# Patient Record
Sex: Female | Born: 1950 | Race: White | Hispanic: No | Marital: Married | Smoking: Never smoker
Health system: Southern US, Community
[De-identification: ages and names within clinical notes are randomized; demographics above are authoritative.]

## PROBLEM LIST (undated history)

## (undated) DIAGNOSIS — J45909 Unspecified asthma, uncomplicated: Secondary | ICD-10-CM

## (undated) DIAGNOSIS — E782 Mixed hyperlipidemia: Secondary | ICD-10-CM

## (undated) DIAGNOSIS — I499 Cardiac arrhythmia, unspecified: Secondary | ICD-10-CM

## (undated) DIAGNOSIS — K219 Gastro-esophageal reflux disease without esophagitis: Secondary | ICD-10-CM

## (undated) DIAGNOSIS — F419 Anxiety disorder, unspecified: Secondary | ICD-10-CM

## (undated) DIAGNOSIS — R55 Syncope and collapse: Secondary | ICD-10-CM

## (undated) DIAGNOSIS — C50919 Malignant neoplasm of unspecified site of unspecified female breast: Secondary | ICD-10-CM

## (undated) DIAGNOSIS — M1990 Unspecified osteoarthritis, unspecified site: Secondary | ICD-10-CM

## (undated) DIAGNOSIS — IMO0002 Reserved for concepts with insufficient information to code with codable children: Secondary | ICD-10-CM

## (undated) DIAGNOSIS — N649 Disorder of breast, unspecified: Secondary | ICD-10-CM

## (undated) DIAGNOSIS — IMO0001 Reserved for inherently not codable concepts without codable children: Secondary | ICD-10-CM

## (undated) HISTORY — DX: Gastro-esophageal reflux disease without esophagitis: K21.9

## (undated) HISTORY — DX: Mixed hyperlipidemia: E78.2

## (undated) HISTORY — DX: Malignant neoplasm of unspecified site of unspecified female breast: C50.919

## (undated) HISTORY — DX: Cardiac arrhythmia, unspecified: I49.9

## (undated) HISTORY — DX: Unspecified asthma, uncomplicated: J45.909

## (undated) HISTORY — DX: Reserved for inherently not codable concepts without codable children: IMO0001

## (undated) HISTORY — DX: Syncope and collapse: R55

## (undated) HISTORY — DX: Anxiety disorder, unspecified: F41.9

## (undated) HISTORY — DX: Reserved for concepts with insufficient information to code with codable children: IMO0002

## (undated) HISTORY — DX: Disorder of breast, unspecified: N64.9

---

## 1993-02-17 HISTORY — PX: OTHER SURGICAL HISTORY: SHX169

## 2001-08-30 ENCOUNTER — Encounter: Payer: Self-pay | Admitting: Obstetrics and Gynecology

## 2001-08-30 ENCOUNTER — Ambulatory Visit (HOSPITAL_COMMUNITY): Admission: RE | Admit: 2001-08-30 | Discharge: 2001-08-30 | Payer: Self-pay | Admitting: Obstetrics and Gynecology

## 2002-10-14 ENCOUNTER — Other Ambulatory Visit: Admission: RE | Admit: 2002-10-14 | Discharge: 2002-10-14 | Payer: Self-pay | Admitting: Obstetrics and Gynecology

## 2003-09-13 ENCOUNTER — Ambulatory Visit (HOSPITAL_COMMUNITY): Admission: RE | Admit: 2003-09-13 | Discharge: 2003-09-13 | Payer: Self-pay | Admitting: Obstetrics and Gynecology

## 2004-02-18 HISTORY — PX: ABDOMINAL HYSTERECTOMY: SHX81

## 2004-08-30 ENCOUNTER — Ambulatory Visit (HOSPITAL_COMMUNITY): Admission: RE | Admit: 2004-08-30 | Discharge: 2004-08-30 | Payer: Self-pay | Admitting: Obstetrics and Gynecology

## 2004-09-04 ENCOUNTER — Ambulatory Visit (HOSPITAL_COMMUNITY): Admission: RE | Admit: 2004-09-04 | Discharge: 2004-09-04 | Payer: Self-pay | Admitting: Obstetrics and Gynecology

## 2004-09-12 ENCOUNTER — Other Ambulatory Visit: Admission: RE | Admit: 2004-09-12 | Discharge: 2004-09-12 | Payer: Self-pay | Admitting: Gynecology

## 2004-09-23 ENCOUNTER — Observation Stay (HOSPITAL_COMMUNITY): Admission: RE | Admit: 2004-09-23 | Discharge: 2004-09-24 | Payer: Self-pay | Admitting: Gynecology

## 2004-09-23 ENCOUNTER — Encounter (INDEPENDENT_AMBULATORY_CARE_PROVIDER_SITE_OTHER): Payer: Self-pay | Admitting: Specialist

## 2005-09-09 ENCOUNTER — Ambulatory Visit (HOSPITAL_COMMUNITY): Admission: RE | Admit: 2005-09-09 | Discharge: 2005-09-09 | Payer: Self-pay | Admitting: Gynecology

## 2005-09-22 ENCOUNTER — Other Ambulatory Visit: Admission: RE | Admit: 2005-09-22 | Discharge: 2005-09-22 | Payer: Self-pay | Admitting: Gynecology

## 2006-04-08 ENCOUNTER — Ambulatory Visit: Payer: Self-pay | Admitting: Cardiovascular Disease

## 2006-04-08 ENCOUNTER — Ambulatory Visit (HOSPITAL_COMMUNITY): Admission: RE | Admit: 2006-04-08 | Discharge: 2006-04-08 | Payer: Self-pay | Admitting: Internal Medicine

## 2006-04-10 ENCOUNTER — Ambulatory Visit: Payer: Self-pay | Admitting: Cardiology

## 2006-10-06 ENCOUNTER — Other Ambulatory Visit: Admission: RE | Admit: 2006-10-06 | Discharge: 2006-10-06 | Payer: Self-pay | Admitting: Gynecology

## 2006-10-13 ENCOUNTER — Ambulatory Visit (HOSPITAL_COMMUNITY): Admission: RE | Admit: 2006-10-13 | Discharge: 2006-10-13 | Payer: Self-pay | Admitting: Gynecology

## 2008-08-23 ENCOUNTER — Ambulatory Visit (HOSPITAL_COMMUNITY): Admission: RE | Admit: 2008-08-23 | Discharge: 2008-08-23 | Payer: Self-pay | Admitting: Gynecology

## 2008-09-25 ENCOUNTER — Ambulatory Visit: Payer: Self-pay | Admitting: Gynecology

## 2008-09-27 ENCOUNTER — Encounter: Payer: Self-pay | Admitting: Gynecology

## 2008-09-27 ENCOUNTER — Other Ambulatory Visit: Admission: RE | Admit: 2008-09-27 | Discharge: 2008-09-27 | Payer: Self-pay | Admitting: Gynecology

## 2008-09-27 ENCOUNTER — Ambulatory Visit: Payer: Self-pay | Admitting: Gynecology

## 2009-04-20 ENCOUNTER — Ambulatory Visit: Payer: Self-pay | Admitting: Gastroenterology

## 2009-04-20 ENCOUNTER — Ambulatory Visit (HOSPITAL_COMMUNITY): Admission: EM | Admit: 2009-04-20 | Discharge: 2009-04-20 | Payer: Self-pay | Admitting: Emergency Medicine

## 2009-04-20 ENCOUNTER — Telehealth: Payer: Self-pay | Admitting: Gastroenterology

## 2009-04-20 ENCOUNTER — Encounter: Payer: Self-pay | Admitting: Gastroenterology

## 2009-04-20 HISTORY — PX: ESOPHAGOGASTRODUODENOSCOPY: SHX1529

## 2009-04-23 ENCOUNTER — Telehealth: Payer: Self-pay | Admitting: Gastroenterology

## 2009-04-25 ENCOUNTER — Encounter: Payer: Self-pay | Admitting: Gastroenterology

## 2009-05-02 ENCOUNTER — Ambulatory Visit: Payer: Self-pay | Admitting: Gastroenterology

## 2009-05-02 HISTORY — PX: OTHER SURGICAL HISTORY: SHX169

## 2009-05-03 ENCOUNTER — Encounter (INDEPENDENT_AMBULATORY_CARE_PROVIDER_SITE_OTHER): Payer: Self-pay

## 2009-05-11 ENCOUNTER — Encounter: Payer: Self-pay | Admitting: Urgent Care

## 2009-05-22 ENCOUNTER — Telehealth (INDEPENDENT_AMBULATORY_CARE_PROVIDER_SITE_OTHER): Payer: Self-pay

## 2009-05-29 ENCOUNTER — Ambulatory Visit: Payer: Self-pay | Admitting: Gastroenterology

## 2009-05-29 DIAGNOSIS — K219 Gastro-esophageal reflux disease without esophagitis: Secondary | ICD-10-CM

## 2009-05-29 DIAGNOSIS — R195 Other fecal abnormalities: Secondary | ICD-10-CM | POA: Insufficient documentation

## 2009-07-18 HISTORY — PX: COLONOSCOPY: SHX174

## 2009-07-27 ENCOUNTER — Ambulatory Visit (HOSPITAL_COMMUNITY): Admission: RE | Admit: 2009-07-27 | Discharge: 2009-07-27 | Payer: Self-pay | Admitting: Gastroenterology

## 2009-07-27 ENCOUNTER — Ambulatory Visit: Payer: Self-pay | Admitting: Gastroenterology

## 2009-07-27 HISTORY — PX: ESOPHAGOGASTRODUODENOSCOPY: SHX1529

## 2010-03-10 ENCOUNTER — Encounter: Payer: Self-pay | Admitting: Obstetrics and Gynecology

## 2010-03-10 ENCOUNTER — Encounter: Payer: Self-pay | Admitting: Emergency Medicine

## 2010-03-19 NOTE — Medication Information (Signed)
Summary: Tax adviser   Imported By: Diana Eves 04/20/2009 13:07:01  _____________________________________________________________________  External Attachment:    Type:   Image     Comment:   External Document  Appended Document: RX Folder Need written version  Appended Document: RX FolderDEXILANT    Prescriptions: DEXILANT 60 MG CPDR (DEXLANSOPRAZOLE) 1 by mouth every AM. Take a dose today.  #30 x 5   Entered and Authorized by:   Joselyn Arrow FNP-BC   Signed by:   Joselyn Arrow FNP-BC on 04/23/2009   Method used:   Electronically to        [facility] (retail)       [address]       [place], [place]  [address]       Ph: [phone]       Fax: [phone]   RxID:   [id number]     Appended Document: RX FolderDexilant  Meds updated w/ pt.  PA done.  Pt failed OTC omeprazole >26month and nexium 40mg  daily >40yrs.

## 2010-03-19 NOTE — Medication Information (Signed)
Summary: PA for dexilant  PA for dexilant   Imported By: Hendricks Limes LPN 45/40/9811 91:47:82  _____________________________________________________________________  External Attachment:    Type:   Image     Comment:   External Document

## 2010-03-19 NOTE — Progress Notes (Signed)
Summary: change to Aciphex  Phone Note Other Incoming   Summary of Call: RMR called- pt is having some diarrhea that they feel is associated with Dexilant. Pt to try Aciphex. #15 samples left at the front desk.  Initial call taken by: Hendricks Limes LPN,  May 22, 2009 9:07 AM     Appended Document: change to Aciphex pts husband, Dr. Emelda Fear related this information to me.  Will get a telephone report in  1 week

## 2010-03-19 NOTE — Progress Notes (Signed)
Summary: Start Baclofen. Dexilant samples  Sx improved. Not using carafate ot Lidocaine. Needs OPV in 2 months, RE: GERD. Also please take Dexilant samples for pt to Dr. Rayna Sexton office, #14. They are for his wife. West Bali MD  April 23, 2009 2:13 PM      New/Updated Medications: BACLOFEN 10 MG TABS (BACLOFEN) 1 by mouth 30 minutes prior to breakfast and lunch. Try first dose at home. Prescriptions: BACLOFEN 10 MG TABS (BACLOFEN) 1 by mouth 30 minutes prior to breakfast and lunch. Try first dose at home.  #60 x 5   Entered and Authorized by:   West Bali MD   Signed by:   West Bali MD on 04/23/2009   Method used:   Electronically to        [facility] (retail)       [address]       [place], [place]  [address]       Ph: [phone]       Fax: [phone]   RxID:   [id number]   Appended Document: Start Baclofen. Dexilant samples Samples left for courrier to take to Dr. Robinette Haines office

## 2010-03-19 NOTE — Assessment & Plan Note (Signed)
Summary: GERD, DIARRHEA   Visit Type:  Initial Consult Primary Care Provider:  Ouida Sills, M.D.  Chief Complaint:  hospital f/U.  History of Present Illness: No soild stool since starting Dexilant. 45-1hr after a meal: liquid/watery, and recognizable food parts. Never switched to Aciphex. Work schedule may be having an impact. When she worlks less stomach better. dexilant helped reflux but still has abd pain. Fels like her breath is hot. Not with Nexium. No sore throat or resp Sx with dexilant. Nexium is the only Rx med for Sx. Thinks Baclofen has helped.   Current Medications (verified): 1)  Dexilant 60 Mg Cpdr (Dexlansoprazole) .[name] Kitchen.. 1 By Mouth Every Am. Take A Dose Today. 2)  Baclofen 10 Mg Tabs (Baclofen) .[name] Kitchen.. 1 By Mouth 30 Minutes Prior To Breakfast and Lunch. Try First Dose At Home. 3)  Estratest 1.25mg  .... One By Mouth Daily 4)  Calcium 1200 .... Take 1 Tablet By Mouth Once A Day 5)  Centrum Silver .... Take 1 Tablet By Mouth Once A Day  Allergies (verified): 1)  ! Biaxin 2)  ! Nsaids 3)  ! Flagyl 4)  ! * Decongestants  Past History:  Past Medical History: GERD **2011 EGD/ gastric BX: No Barrett's, MILD GASTRITIS **2011 BRAVO CAPSULE: Gastroesophageal reflux disease with adequate acid , possible nonacid reflux-DAY 1: 24 episodes of reflux; DAY 2: 1 episode of reflux. The DeMeester score on day 1 was 7.3, DAY 2: 0.3. Her symptom association probability was 98.4% supine and 98.4% regurgitation.  ABD PAIN  Family History: 3 relatives had esophageal CA: 1/3 no EtOH or tobacco, mother had adenoCA. Other 2o athology not knowmn.  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 No ETOH since being on Baclofen.  Review of Systems       MAR 2011: ALT 31   AST 40h    ALK PHOS 108 TBILI 0.8 ALB 3.7 LIP 19 UA: NEG CR 0.73 K 4.0 WBC 11.1 HB 15.7 PLT 231  Vital Signs:  Patient profile:   60 year old female Height:      67.5 inches Weight:      153 pounds BMI:     23.69 Temp:     97.9  degrees F oral Pulse rate:   72 / minute BP sitting:   110 / 70  (left arm) Cuff size:   regular  Vitals Entered By: Cloria Spring LPN (May 29, 2009 4:20 PM)  Physical Exam  General:  Well developed, well nourished, no acute distress. Head:  Normocephalic and atraumatic. Eyes:  PERRLA, no icterus. Lungs:  Clear throughout to auscultation. Heart:  Regular rate and rhythm; no murmurs. Abdomen:  Soft, MILD EPIGASTRIC tenderness and nondistended. Normal bowel sounds. Extremities:  No edema or deformities noted. Neurologic:  Alert and  oriented x4;  grossly normal neurologically.  Impression & Recommendations:  Problem # 1:  GERD (ICD-530.81)  Pt has 3 relatives with esophageal CA. Reflex moderately controlled. Stop Dexilant 2o to side effects: ? diarrhea. Add Aciphex daily. Continue Baclofen. Consider repreat EGD in 5 years. Explained to pt unless she has uncotrolled acid reflux, then fundoplication:endo or lap will likely not benefit her. Pt declined TCA for stress related abd pain. OPV in 4 mos.  Orders: Est. Patient Level V ([address])  Problem # 2:  SCREENING, COLON CANCER (ICD-V76.51)  TCS JUNE 10-SUPREP.  Orders: Est. Patient Level V ([address])  Problem # 3:  DIARRHEA (ICD-787.91) 2o to Dexilant. Pt asked to call me regrading diarrhea in 2 weeks. If Sx  persists, stool studIes.  CC: PCP Prescriptions: ACIPHEX 20 MG TBEC (RABEPRAZOLE SODIUM) 1 by mouth every morning  #30 x 5   Entered and Authorized by:   West Bali MD   Signed by:   West Bali MD on 05/29/2009   Method used:   Electronically to        [facility] (retail)       [address] [place], [place]  [address]       Ph: [phone]       Fax: [phone]   RxID:   [id number]

## 2010-03-19 NOTE — Letter (Signed)
Summary: TCS ORDER  TCS ORDER   Imported By: Ave Filter 05/29/2009 17:00:34  _____________________________________________________________________  External Attachment:    Type:   Image     Comment:   External Document

## 2010-03-19 NOTE — Letter (Signed)
Summary: PATH REPORT  PATH REPORT   Imported By: Diana Eves 05/02/2009 16:33:01  _____________________________________________________________________  External Attachment:    Type:   Image     Comment:   External Document

## 2010-03-19 NOTE — Letter (Signed)
Summary: Plan of Care, Need to Discuss  [facility]  [address]   [place], [place] [address]   Phone: [phone]  Fax: [phone]    May 03, 2009  Angie Jennings [address] [place], [place]  [address] [birth year]   Dear [name],   We are writing this letter to inform you of treatment plans and/or discuss your plan of care.  We have tried several times to contact you; however, we have yet to reach you.  We ask that you please contact our office for follow-up on your gastrointestinal issues.  We can  be reached at [phone] to schedule an appointment, or to speak with someone regarding your health care needs.  Please do not neglect your health.   Sincerely,    Cloria Spring LPN  [facility] Ph: [phone]    Fax: [phone]   Appended Document: Plan of Care, Need to Discuss Letter returned. Spoke with pt at work, informed her of the plan, she has follow-up appt in  may to see Dr. Darrick Penna. York Spaniel her address is correct, not sure shy letter returned)

## 2010-03-19 NOTE — Progress Notes (Signed)
Summary: chest pain, BRAVO STUDY       New/Updated Medications: DEXILANT 60 MG CPDR (DEXLANSOPRAZOLE) 1 by mouth every AM. Take a dose today. CARAFATE 1 GM/10ML SUSP (SUCRALFATE) 1 gm by mouth q6h as needed chest pain LIDOCAINE VISCOUS 2 % SOLN (LIDOCAINE HCL) 10 ml q4-6h as needed chest pain Prescriptions: LIDOCAINE VISCOUS 2 % SOLN (LIDOCAINE HCL) 10 ml q4-6h as needed chest pain  #200 ml x 5   Entered and Authorized by:   West Bali MD   Signed by:   West Bali MD on 04/20/2009   Method used:   Electronically to        [facility] (retail)       [address] [place], [place]  [address]       Ph: [phone]       Fax: [phone]   RxID:   [id number] CARAFATE 1 GM/10ML SUSP (SUCRALFATE) 1 gm by mouth q6h as needed chest pain  #90 grams x 5   Entered and Authorized by:   West Bali MD   Signed by:   West Bali MD on 04/20/2009   Method used:   Electronically to        [facility] (retail)       [address]       [place], [place]  [address]       Ph: [phone]       Fax: [phone]   RxID:   [id number] DEXILANT 60 MG CPDR (DEXLANSOPRAZOLE) 1 by mouth every AM. Take a dose today.  #30 x 5   Entered and Authorized by:   West Bali MD   Signed by:   West Bali MD on 04/20/2009   Method used:   Electronically to        [facility] (retail)       [address] [place], [place]  [address]       Ph: [phone]       Fax: [phone]   RxID:   [id number]  Pt seen in ED. EGD: mild gastritis. Needs rx for carafate, viscous lidocaine, and Kapidex. West Bali MD  April 20, 2009 11:43 AM

## 2010-03-22 NOTE — Consult Note (Signed)
Summary: Consultation Report  Consultation Report   Imported By: Diana Eves 04/25/2009 13:39:46  _____________________________________________________________________  External Attachment:    Type:   Image     Comment:   External Document

## 2010-05-06 ENCOUNTER — Encounter: Payer: Self-pay | Admitting: Gastroenterology

## 2010-05-13 LAB — HEPATIC FUNCTION PANEL
ALT: 31 U/L (ref 0–35)
AST: 40 U/L — ABNORMAL HIGH (ref 0–37)
Bilirubin, Direct: 0.1 mg/dL (ref 0.0–0.3)
Total Bilirubin: 0.8 mg/dL (ref 0.3–1.2)

## 2010-05-13 LAB — CBC
HCT: 47.1 % — ABNORMAL HIGH (ref 36.0–46.0)
MCHC: 33.4 g/dL (ref 30.0–36.0)
Platelets: 231 10*3/uL (ref 150–400)
RBC: 5.12 MIL/uL — ABNORMAL HIGH (ref 3.87–5.11)

## 2010-05-13 LAB — URINALYSIS, ROUTINE W REFLEX MICROSCOPIC
Glucose, UA: NEGATIVE mg/dL
Ketones, ur: NEGATIVE mg/dL
Protein, ur: NEGATIVE mg/dL
Specific Gravity, Urine: 1.005 (ref 1.005–1.030)
pH: 5.5 (ref 5.0–8.0)

## 2010-05-13 LAB — BASIC METABOLIC PANEL
BUN: 13 mg/dL (ref 6–23)
Calcium: 9.1 mg/dL (ref 8.4–10.5)
Creatinine, Ser: 0.73 mg/dL (ref 0.4–1.2)
GFR calc Af Amer: >60 mL/min (ref >60)
GFR calc non Af Amer: >60 mL/min (ref >60)
Potassium: 4 mEq/L (ref 3.5–5.1)
Sodium: 141 mEq/L (ref 135–145)

## 2010-05-13 LAB — DIFFERENTIAL
Basophils Absolute: 0 10*3/uL (ref 0.0–0.1)
Eosinophils Relative: 3 % (ref 0–5)
Monocytes Absolute: 0.7 10*3/uL (ref 0.1–1.0)
Neutro Abs: 8.3 10*3/uL — ABNORMAL HIGH (ref 1.7–7.7)
Neutrophils Relative %: 75 % (ref 43–77)

## 2010-05-13 LAB — POCT CARDIAC MARKERS: Myoglobin, poc: 44.1 ng/mL (ref 12–200)

## 2010-05-13 LAB — AMYLASE: Amylase: 35 U/L (ref 0–105)

## 2010-05-16 NOTE — Medication Information (Signed)
Summary: PA for Aciphex  PA for Aciphex   Imported By: Hendricks Limes LPN 16/11/9602 54:09:81  _____________________________________________________________________  External Attachment:    Type:   Image     Comment:   External Document

## 2010-07-05 NOTE — H&P (Signed)
NAME:  Angie Jennings, Angie Jennings NO.:  [id number]   MEDICAL RECORD NO.:  [id number]          PATIENT TYPE:  OBV   LOCATION:                                FACILITY:  [facility]   PHYSICIAN:  Juan H. Lily Peer, M.D.DATE OF BIRTH:  1950/06/27   DATE OF ADMISSION:  09/23/2004  DATE OF DISCHARGE:                                HISTORY & PHYSICAL   CHIEF COMPLAINT:  Chronic right lower quadrant pain.   HISTORY:  The patient is a 60 year old, gravida 5, para 3, AB 2, who was  referred to our practice on September 13, 2004 as a courtesy of Dr. Christin Bach, OB/GYN, in [place], [place].  The patient has chronic  lower quadrant pain.  As part of her history, she has stated that her  periods have been regular every 30-35 days up until January 2006 with no  unusual pattern as to mild pelvic heaviness, a day or so prior to the onset  of her periods.  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  Her problems started in March whereby she began experiencing  on day #6 some spotting and pelvic pain and developed a low grade fever  peaking at 101 and subsequently had a urinalysis, urine culture and  sensitivity, and GC and Chlamydia culture.  All were normal with the  exception of slightly elevated white blood count. While waiting for the  results of the culture, she initially had been placed on erythromycin for  three days and then switched to Levaquin for 10 days.  The fever  defervesced.  She still continued to fill somewhat the discomfort in the  lower abdomen.  She skipped her period in April but still continued to feel  bloated.  On May 12 and 13, she continued to have pressure and pain right  before her menses as well as during her periods along with the bloating and  tenderness.  On July 8, she begin to have right lower quadrant discomfort of  the abdomen and states that when she  straightens her torso, that she feels  some tightening or pulling in the right lower abdominal area.  There was no  urinary or GI problems reported.  No nausea or vomiting.  Appetite has been  the same.  She subsequently had an ultrasound July 14 which had demonstrated  a small left ovarian cyst measuring 1.6 cm and a hypoechoic mass between the  right ovary and the uterus.  It appeared solid.  In an effort to delineate  the solid mass to see if it was coming off the right ovary or hemorrhage  cyst, or a pedunculated fibroid, an MRI was done with contrast on July 19,  and the findings were as follows:   She had a 2.7 x 2.3 x 2 cm exophytic mass arising from the anterior aspect  of the uterus on the right and this corresponded with the location and the  findings on the recent ultrasound.  The right ovary  was able to be  delineated separately and posterior to the right ovary.  A tangle of veins  were demonstrated that were described as draining into the right ovarian  vein.  A small 8 mm left ovarian cyst was noted.  Endometrium had a normal  appearance.  No enlarged lymph nodes were seen or any free fluid in the  peritoneal cavity was noted.   PAST MEDICAL HISTORY:  She has had five pregnancies, three children  delivered vaginally and two miscarriages.   FAMILY HISTORY:  Father was hypertension, also lung cancer.  Mother with  history of esophageal cancer and brother with prostate cancer.  Her husband  has had a vasectomy in the past.   ALLERGIES:  BIAXIN, METRONIDAZOLE, NONSTEROIDALS.   PAST SURGICAL HISTORY:  In 1998, she had an L4-5 fusion.   CURRENT MEDICATIONS:  Darvocet p.r.n. for back pain and Sudafed for  congestion p.r.n.   PHYSICAL EXAMINATION:  VITAL SIGNS:  The patient is 5 feet 7 inches tall.  She weighs 172 pounds.  Blood pressure was 136/80.  HEENT: Unremarkable.  NECK:  Supple.  Trachea midline.  No carotid bruits.  No thyromegaly.  LUNGS:  Clear to auscultation.   No rhonchus or wheezes.  HEART:  Regular rate and rhythm.  No murmurs or gallops.  BREASTS:  Both breasts were symmetrical in appearance.  No skin excoriation  or nipple inversion.  No palpable masses or tenderness.  No supraclavicular  or axillary lymphadenopathy.  ABDOMEN:  Soft, tenderness in the right lower quadrant but no rebound or  guarding.  Positive bowel sounds.  PELVIC:  Bartholin's, urethral and Skein's glands within normal limits.  Vagina is small.  First degree rectocele.  No cystocele per se.  No uterine  descensus even when she was examined in the erect position.  Bimanual exam  showed anteverted uterus, irregularity on the right uterine sidewall.  Tenderness to palpation with cervical motion tenderness.  RECTAL:  Hemoccult negative.  Mild tenderness elicited in the rectal exam.  The patient has stated that she had had a colonoscopy in 1990 as a result of  occult blood and she was scheduled for one next year.  Negative colon cancer  history in her family.  The patient is also due for a mammogram and one was  done last year.   LABORATORY DATA:  Besides the ultrasound and CT scan which was reported  above, she stated that back in March 2006 her CBC had just only had a  slightly elevated white blood count with 10.8.  Her urinalysis had been  negative along with GC and Chlamydia cultures.   ASSESSMENT:  A 60 year old, gravida 5, para 3, AB 2, with chronic right  lower quadrant pain. 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  The patient is perimenopausal.  She stated that she started to  have slight advance of oligomenorrhea.   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  I explained to Mrs. Maske that the recommendation  would be to proceed with an exploration and since she is very active and  cannot afford any down time, that perhaps we may offer her a laparoscopic  approach such as the laparoscopic-assisted  vaginal hysterectomy and  bilateral salpingo-oophorectomy.  In the event of any technical difficulty  or suspicious lesions that would not be able to be obtained  laparoscopically, then open abdominal approach would need to be utilized.  We had a detailed discussion of the [facility] trial and we  would also recommend placement on a low dose estrogen post surgery for a few  years and eventually taper off.  These risks and benefits and pros and cons  of hysterectomy, potential complications, were discussed.  Literature  information was also provided on hysterectomy.  We discussed potential  complications to include trauma to internal organs requiring reparative  surgery such as injury to the bladder, intestines, blood vessels.  Also in  the event of uncontrollable hemorrhage and she would need a blood  transfusion, potential risks of anaphylactic reaction and hepatitis and  these were all discussed.  The patient will also have PSA stockings to  prevent DVT and subsequent pulmonary embolism.  She should also have  intravenous antibiotics for prophylaxis.  We have recommended also bowel  prep prior to surgery.   PLAN:  The patient is scheduled for laparoscopic-assisted vaginal  hysteroscopy with bilateral salpingo-oophorectomy, possible laparotomy  Monday, August 7 at 7:30 a.m. at [facility].      Arlington H. Lily Peer, M.D.  Electronically Signed     JHF/MEDQ  D:  09/20/2004  T:  09/20/2004  Job:  [id number]

## 2010-07-05 NOTE — Procedures (Signed)
NAMESIMRA, FIEBIG NO.:  [id number]   MEDICAL RECORD NO.:  [id number]          PATIENT TYPE:  OUT   LOCATION:  RAD                           FACILITY:  [facility]   PHYSICIAN:  Noralyn Pick. Eden Emms, MD, FACCDATE OF BIRTH:  09-19-1950   DATE OF PROCEDURE:  04/08/2006  DATE OF DISCHARGE:                                ECHOCARDIOGRAM   PROCEDURE:  Echocardiogram.   INDICATIONS:  Palpitations.   The left ventricular  cavity size was normal.  EF was 60%.  There were  no wall motion abnormalities and no LVH.  The mitral valve was mildly  thickened with trivial MR.  The left atrium and right sided cardiac  chambers were normal.  There was no evidence of pulmonary hypertension,  only mild PR.  The aortic valve was trileaflet and sclerotic.  There was  no evidence of stenosis.  The aortic root was normal.  Subcostal imaging  revealed no atrial septal defect, no source of embolus, no effusion.   M-mode measurements included an aortic diameter of 26 mm, left atrial  diameter 35 mm, septal thickness of 10 mm, LV diastolic dimension 45 mm,  and LV systolic dimension 31 mm.   IMPRESSION:  1. Normal left ventricular function, ejection fraction 60%.  2. Trivial mitral regurgitation without prolapse.  3. Normal left atrium and right sided cardiac chambers.  4. No pericardial effusion.  5. Aortic valve sclerosis.      Noralyn Pick. Eden Emms, MD, [facility]  Electronically Signed     PCN/MEDQ  D:  04/08/2006  T:  04/08/2006  Job:  [id number]

## 2010-07-05 NOTE — Procedures (Signed)
NAMEJASIMINE, SIMMS NO.:  [id number]   MEDICAL RECORD NO.:  [id number]          PATIENT TYPE:  OUT   LOCATION:  RAD                           FACILITY:  [facility]   PHYSICIAN:  Noralyn Pick. Eden Emms, MD, FACCDATE OF BIRTH:  January 26, 1951   DATE OF PROCEDURE:  DATE OF DISCHARGE:                                ECHOCARDIOGRAM   Audio too short to transcribe (less than 5 seconds)      Peter C. Eden Emms, MD, [facility]     PCN/MEDQ  D:  04/08/2006  T:  04/08/2006  Job:  [id number]

## 2010-07-05 NOTE — Discharge Summary (Signed)
NAMEALEXAS, Angie Jennings NO.:  [id number]   MEDICAL RECORD NO.:  [id number]          PATIENT TYPE:  OBV   LOCATION:  9308                          [facility]ILITY:  [facility]   PHYSICIAN:  Juan H. Lily Peer, M.D.DATE OF BIRTH:  02/01/1951   DATE OF ADMISSION:  09/23/2004  DATE OF DISCHARGE:                                 DISCHARGE SUMMARY   Total days hospitalized:  1.   HISTORY:  The patient is a 60 year old gravida 5 para 3 AB 2 with chronic  right lower quadrant pain and suspected leiomyoma contributing to chronic  pain, possibly underlying endometriosis or pelvic adhesions. The patient  underwent a diagnostic laparoscopy with laparoscopic-assisted vaginal  hysterectomy, bilateral salpingo-oophorectomy, and lysis of pelvic  adhesions. The patient's blood loss from the procedure was less than 100 mL  and she received 2000 mL of lactated Ringer's. Urine output had been 500 mL.  The patient had received prophylaxis antibiotic 2 g of Cefotan prior to her  surgery and had PSA stockings for DVT prophylaxis. The patient  postoperatively did well. In the evening of her surgery she was tolerating  clear liquids and the following morning her Foley catheter had been  discontinued at 6 a.m. and she had tolerated a full regular diet and had  been up and ambulating, anxious to go home. Her vital signs were as follows:  Her temperature was 98.3. She had a hemoglobin of 11.9; hematocrit of 35.1;  platelet count 226,000. Her lungs were clear to auscultation. Heart regular  rate and rhythm, no murmurs or gallops. The abdomen was soft, positive bowel  sounds. Slight small ecchymoses were noted at the three port incision sites  but no evidence of erythema. The patient reported no vaginal bleeding.  Extremities without any edema or any tenderness or cords. The patient was  ready to be discharged home.   DIAGNOSES:  1.  Chronic right lower quadrant pain.  2.  Leiomyomatous uteri.  3.  Pelvic  adhesions.   PROCEDURE PERFORMED:  1.  Diagnostic laparoscopy.  2.  Lysis of pelvic adhesions.  3.  Laparoscopic-assisted vaginal hysterectomy with bilateral salpingo-      oophorectomy.   FINAL DISPOSITION AND FOLLOW-UP:  The patient was discharged home within 24  hours from her surgery. She had been up and ambulating, tolerating full  liquid and regular diet, had passed gas, was afebrile. Pathology report  pending at time of this dictation. The patient was given a prescription of  Lortab to take one tablet q.4-6h. p.r.n. (7.5/500). Also she was given a  prescription of Reglan 10 mg to take one p.o. q.3-4h. p.r.n. For hormone  replacement therapy, she was placed on Estratest 0.625 mg p.o. daily and she  was scheduled to return back to the office in 2 weeks for her postoperative  visit.     JHF/MEDQ  D:  09/24/2004  T:  09/24/2004  Job:  [id number]

## 2010-07-05 NOTE — Op Note (Signed)
NAMEJAVANNA, [name] NO.:  [id number]   MEDICAL RECORD NO.:  [id number]          PATIENT TYPE:  OBV   LOCATION:  9308                          FACILITY:  [facility]   PHYSICIAN:  Juan H. Lily Peer, M.D.DATE OF BIRTH:  [birth year]   DATE OF PROCEDURE:  09/23/2004  DATE OF DISCHARGE:                                 OPERATIVE REPORT   SURGEON:  Juan H. Lily Peer, M.D.   FIRST ASSISTANT:  Daniel L. Eda Paschal, M.D.   INDICATIONS FOR PROCEDURE:  A 60 year old gravida 5, para 3, AB 2, with  chronic right lower quadrant pain.  Ultrasound and CT scan demonstrating a  subserosal leiomyoma.  Working diagnoses besides the subserosal leiomyoma  are pelvic adhesions and/or endometriosis contributing to her chronic pelvic  pain.   PREOPERATIVE DIAGNOSES:  1.  Chronic pelvic pain.  2.  Leiomyomata uteri.  3.  Rule out endometriosis.  4.  Rule out adhesions.   POSTOPERATIVE DIAGNOSES:  1.  Subserosal leiomyoma.  2.  Pelvic adhesions in the cul-de-sac.  3.  Appendices epiploica.   ANESTHESIA:  General endotracheal anesthesia.   OPERATION/PROCEDURE:  1.  Diagnostic laparoscopy.  2.  Lysis of pelvic adhesions.  3.  Removal of right infarcted appendices epiploica.  4.  Laparoscopic-assisted vaginal hysterectomy with bilateral salpingo-      oophorectomy.   FINDINGS:  1.  The patient had a 2 x 3 cm subserosal leiomyoma coming off the right      uterine fundal region.  2.  Pelvic adhesions in the region of the cul-de-sac.  3.  Adherence of an appendices epiploica to the right pelvic sidewall in the      cul-de-sac.  4.  Normal-appearing tubes and ovaries.   DESCRIPTION OF PROCEDURE:  After the patient was adequately counseled, she  was taken to the operating room where she underwent a successful general  endotracheal anesthesia.  The patient had been on a bowel prep the day  before and the day of her surgery received 2 g of Cefotan for prophylaxis  and had PSA stockings  for DVT prophylaxis.  The patient was placed in the  lower lithotomy position after the abdomen was prepped.  The abdomen, vagina  and perineum were prepped and draped in the usual fashion.  A small stab  incision was made in the subumbilical region and a Veress needle was  introduced into the peritoneal cavity.  Opening intra-abdominal pressure was  approximately 4 mmHg.  Approximately 3 L of carbon dioxide were insufflated  into the peritoneal cavity.  The Veress needle was removed.  The 10 mm  trocar was inserted and the diagnostic laparoscope was inserted.  Two  additional incisions were made to allow 5 mm trocars to be inserted under  laparoscopic guidance approximately five fingerbreadths from midline on the  patient's lower abdomen bilaterally.  In a systematic fashion, the entire  pelvic cavity was inspected with the above-mentioned findings.  The appendix  appeared retrocecal and appeared to be intact.  Smooth liver surface and  smooth appearance of the gallbladder.   Attention was then placed first to the  cul-de-sac region where the adhesions  were noted.  First, near the right uterosacral ligament, an adherence of  what appeared to be an infarcted appendices epiploica was excised, removed  and  passed off the operative field for histologic evaluation after the  adhesions were freed.  A left para-ovarian adhesion to the cul-de-sac was  freed as well with the use of the tripolar unit.  Once the adhesions were  freed, the hysterectomy was started in the following fashion.   The right ureter was identified.  There infundibulopelvic ligament was  clamped, cauterized and transected all the way to include the round ligament  which was clamped, cauterized and transected and the broad ligament and  cardinal ligament down to the level of the uterine artery and the bladder  flap had been established with the laparoscopic scissors.  Similar procedure  was carried out on the contralateral  side and the vaginal portion of the  operation was then undertaken in the following fashion.  The patient's legs  were placed in the high lithotomy position and Lahey thyroid clamps were  placed on the anterior and posterior cervical lip.  2% lidocaine with  1:100,000 epinephrine was infiltrated to the cervicovaginal fold in  circumferential fashion followed by circumferential incision on a similar  region.  A posterior colpotomy was established.  The long weighted billed  speculum was inserted to the cul-de-sac.  Each uterosacral ligament was  clamped, cut and suture ligated and tagged with 0 Vicryl suture.  The  remaining broad and cardinal ligaments were clamped, cut and suture ligated  with 0 Vicryl suture.  The anterior cul-de-sac was entered meticulously  after freeing the bladder away from the lower uterine segment.  The  remaining pedicles were clamped, cut and suture ligated with 0 Vicryl  suture.  The uterus and cervix and tubes and ovaries were passed off the  operative field.  The remaining pedicles were secured with 0 Vicryl suture  in a transfixation manner followed by a pursestring suture of 0 Vicryl  suture as well.  The vagina was copiously irrigated with normal saline  solution.  The posterior vaginal cuff was closed with a running stitch of 0  Vicryl suture incorporating the peritoneum and the entire vaginal cuff was  then closed with interrupted sutures of 0 Vicryl suture.  Since the patient  had a somewhat narrow vagina, there was no need for a culdoplasty or any  additional vaginal work.   The vagina was irrigated with normal saline solution once again and the  final portion of the procedure entailed by looking laparoscopically into the  peritoneal cavity to ascertain adequate hemostasis.  In a systematic  fashion, both infundibulopelvic ligament pedicles were inspected and appeared to be dry as was the vaginal cuff.  The peritoneal cavity was  copiously irrigated  with normal saline and upon completion, the carbon  dioxide was removed from the peritoneal cavity and the instruments were  removed.  The subumbilical 10 mm trocar site, the fascia was closed in a  pursestring with 0 Vicryl suture and all three port sites were closed with  Dermabond glue for the skin closure.  For postoperative analgesia, 0.25%  Marcaine was infiltrated in all three incision sites for a total of 10 mL.  The patient tolerated the procedure well.  He had a urine output of 500 mL,  received 2000 mL of lactated Ringer's and estimated blood loss was less than  100 mL.  She was extubated and transferred  to the recovery room with stable  vital signs.       JHF/MEDQ  D:  09/23/2004  T:  09/24/2004  Job:  [id number]

## 2011-01-08 ENCOUNTER — Other Ambulatory Visit: Payer: Self-pay | Admitting: Gastroenterology

## 2011-01-08 NOTE — Telephone Encounter (Signed)
Letter mailed to pt to call and schedule appt.  

## 2011-01-08 NOTE — Telephone Encounter (Signed)
Recommend OV in next 1-2 months prior to further refills.

## 2011-04-07 ENCOUNTER — Other Ambulatory Visit: Payer: Self-pay | Admitting: Gastroenterology

## 2011-09-24 ENCOUNTER — Ambulatory Visit (HOSPITAL_COMMUNITY)
Admission: RE | Admit: 2011-09-24 | Discharge: 2011-09-24 | Disposition: A | Discharge status: Home / Self Care | Payer: BC Managed Care – PPO | Source: Ambulatory Visit | Attending: Internal Medicine | Admitting: Internal Medicine

## 2011-09-24 ENCOUNTER — Other Ambulatory Visit (HOSPITAL_COMMUNITY): Payer: Self-pay | Admitting: Internal Medicine

## 2011-09-24 DIAGNOSIS — R05 Cough: Secondary | ICD-10-CM

## 2011-09-24 DIAGNOSIS — R059 Cough, unspecified: Secondary | ICD-10-CM | POA: Insufficient documentation

## 2011-10-09 ENCOUNTER — Other Ambulatory Visit (HOSPITAL_COMMUNITY): Payer: Self-pay | Admitting: Internal Medicine

## 2011-10-09 DIAGNOSIS — Z139 Encounter for screening, unspecified: Secondary | ICD-10-CM

## 2011-10-14 ENCOUNTER — Ambulatory Visit (HOSPITAL_COMMUNITY)
Admission: RE | Admit: 2011-10-14 | Discharge: 2011-10-14 | Disposition: A | Discharge status: Home / Self Care | Payer: BC Managed Care – PPO | Source: Ambulatory Visit | Attending: Internal Medicine | Admitting: Internal Medicine

## 2011-10-14 DIAGNOSIS — Z1231 Encounter for screening mammogram for malignant neoplasm of breast: Secondary | ICD-10-CM | POA: Insufficient documentation

## 2011-10-14 DIAGNOSIS — Z139 Encounter for screening, unspecified: Secondary | ICD-10-CM

## 2011-10-17 ENCOUNTER — Other Ambulatory Visit: Payer: Self-pay | Admitting: Gastroenterology

## 2011-10-17 NOTE — Telephone Encounter (Signed)
Pt due for OV FU prior to refills

## 2011-10-29 ENCOUNTER — Ambulatory Visit: Payer: BC Managed Care – PPO | Admitting: Gastroenterology

## 2011-11-11 ENCOUNTER — Encounter: Payer: Self-pay | Admitting: Gastroenterology

## 2011-11-12 ENCOUNTER — Ambulatory Visit (INDEPENDENT_AMBULATORY_CARE_PROVIDER_SITE_OTHER): Payer: BC Managed Care – PPO | Admitting: Gastroenterology

## 2011-11-12 ENCOUNTER — Encounter: Payer: BC Managed Care – PPO | Admitting: Gastroenterology

## 2011-11-12 ENCOUNTER — Encounter: Payer: Self-pay | Admitting: Gastroenterology

## 2011-11-12 VITALS — BP 128/78 | HR 73 | Temp 98.1°F | Ht 67.0 in | Wt 157.2 lb

## 2011-11-12 DIAGNOSIS — K219 Gastro-esophageal reflux disease without esophagitis: Secondary | ICD-10-CM

## 2011-11-12 NOTE — Patient Instructions (Addendum)
START ACIPHEX TWICE DAILY.  FOLLOW A LOW FAT DIET. SEE INFO BELOW.  AVOID ITEMS THAT TRIGGER REFLUX.  TRY TO AVOID EVEN DECAF COFFEE AND ALCOHOL. SEE INFO BELOW.   UPPER ENDOSCOPY WITH BRAVO OCT 4.  FOLLOW UP IN DEC 2013.   Lifestyle and home remedies FOR REFLUX  You may eliminate or reduce the frequency of heartburn by making the following lifestyle changes:    Control your weight. Being overweight is a major risk factor for heartburn and GERD. Excess pounds put pressure on your abdomen, pushing up your stomach and causing acid to back up into your esophagus.     Eat smaller meals. 4 TO 6 MEALS A DAY. This reduces pressure on the lower esophageal sphincter, helping to prevent the valve from opening and acid from washing back into your esophagus.     Loosen your belt. Clothes that fit tightly around your waist put pressure on your abdomen and the lower esophageal sphincter.    Eliminate heartburn triggers. Everyone has specific triggers.     Common triggers such as fatty or fried foods, spicy food, tomato sauce, carbonated beverages, alcohol, chocolate, mint, garlic, onion, caffeine and nicotine may make heartburn worse.     Avoid stooping or bending. Tying your shoes is OK. Bending over for longer periods to weed your garden isn't, especially soon after eating.     Don't lie down after a meal. Wait at least three to four hours after eating before going to bed, and don't lie down right after eating.   Alternative medicine   Several home remedies exist for treating GERD, but they provide only temporary relief. They include drinking baking soda (sodium bicarbonate) added to water or drinking other fluids such as baking soda mixed with cream of tartar and water.   Although these liquids create temporary relief by neutralizing, washing away or buffering acids, eventually they aggravate the situation by adding gas and fluid to your stomach, increasing pressure and causing more acid reflux.  Further, adding more sodium to your diet may increase your blood pressure and add stress to your heart, and excessive bicarbonate ingestion can alter the acid-base balance in your body.

## 2011-11-12 NOTE — Progress Notes (Signed)
Reminder in epic to follow up with SF in E30 in 3 months °

## 2011-11-12 NOTE — Progress Notes (Signed)
Faxed to PCP

## 2011-11-12 NOTE — Progress Notes (Signed)
  Subjective:    Patient ID: Angie Jennings, female    DOB: 11/01/1950, 61 y.o.   MRN: [id number]  PCP: [facility]  HPI No problems swallowing. Having trouble with pain in primarily in right ear.3-4 times a week. Coughing and wheezing improved after steroids/zpak aug 2013. CXR: NAIAP, FEELS LIKE ACIPHEX NOT WORKING AND REFLUX WORSE SINCE SHE LOST WEIGHT. WEIGHT 153 LBS IN 2011. ADMITS TO ETOH USE AND DECAF COFFEE. LAST EGD 2011: GASTRITIS, FGP. BRAVO SHOWED NON-ACID REFLUX. WAS ON KAPIDEX AT THE TIME AND STATE SIT CAUSED DIARRHEA. HAS FAILED NEXUM IN THE PAST.  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  Past Medical History  Diagnosis Date  . GERD (gastroesophageal reflux disease)   . Anxiety      Past Surgical History  Procedure Date  . Esophagogastroduodenoscopy 04/20/09    mild gastritis  . Esophagogastroduodenoscopy 07/27/09    small internal hemorrhoids/rare sigmoid colon diverticula other wise no polyps  . Bravo capsule endoscopy 05/02/09    GERD with adequate acid suppression  . Abdominal hysterectomy 2006   Allergies  Allergen Reactions  . Clarithromycin   . Kapidex (Dexlansoprazole) Diarrhea  . Metronidazole   . Nsaids      Current Outpatient Prescriptions  Medication Sig Dispense Refill  . ACIPHEX 20 MG tablet TAKE (1) TABLET BY MOUTH ONCE A DAY IN THE MORNING FOR ACID REFLUX.    . calcium citrate (CALCITRATE - DOSED IN MG ELEMENTAL CALCIUM) 950 MG tablet Take 1 tablet by mouth daily.      . EST ESTROGENS-METHYLTEST HS 0.625-1.25 MG per tablet Take 1 tablet by mouth daily.           Review of Systems     Objective:   Physical Exam  Constitutional: She is oriented to person, place, and time. She appears well-nourished. No distress.  HENT:  Head: Normocephalic and atraumatic.  Mouth/Throat: Oropharynx is clear and moist. No oropharyngeal exudate.  Eyes: Pupils are equal, round, and reactive to light. No scleral icterus.  Neck: Normal range of  motion. Neck supple.  Cardiovascular: Normal rate, regular rhythm and normal heart sounds.   Pulmonary/Chest: Effort normal and breath sounds normal. No respiratory distress.  Abdominal: Soft. Bowel sounds are normal. She exhibits no distension.  Musculoskeletal: Normal range of motion. She exhibits no edema.  Lymphadenopathy:    She has no cervical adenopathy.  Neurological: She is alert and oriented to person, place, and time.       NO FOCAL DEFICITS   Psychiatric:       SLIGHTLY ANXIOUS MOOD, NL AFFECT          Assessment & Plan:   

## 2011-11-12 NOTE — Assessment & Plan Note (Addendum)
SX NOT IDEALLY CONTROLLED ON ACIPHEX QD.  START ACIPHEX TWICE DAILY. NO SAMPLES AVAILABLE. PT DECLINES A NEW RX AT THIS TIME.  FOLLOW A LOW FAT DIET. SEE INFO BELOW.  AVOID ITEMS THAT TRIGGER REFLUX.  TRY TO AVOID EVEN DECAF COFFEE AND ALCOHOL. HO GIVEN.  UPPER ENDOSCOPY WITH BRAVO OCT 4 ON MEDS.  FOLLOW UP IN DEC 2013.

## 2011-11-18 ENCOUNTER — Encounter (HOSPITAL_COMMUNITY): Payer: Self-pay | Admitting: Pharmacy Technician

## 2011-11-19 ENCOUNTER — Ambulatory Visit: Payer: BC Managed Care – PPO | Admitting: Gastroenterology

## 2011-11-19 NOTE — Progress Notes (Signed)
error 

## 2011-11-21 ENCOUNTER — Encounter (HOSPITAL_COMMUNITY)
Admission: RE | Disposition: A | Discharge status: Home / Self Care | Payer: Self-pay | Source: Ambulatory Visit | Attending: Gastroenterology

## 2011-11-21 ENCOUNTER — Ambulatory Visit (HOSPITAL_COMMUNITY)
Admission: RE | Admit: 2011-11-21 | Discharge: 2011-11-21 | Disposition: A | Discharge status: Home / Self Care | Payer: BC Managed Care – PPO | Source: Ambulatory Visit | Attending: Gastroenterology | Admitting: Gastroenterology

## 2011-11-21 ENCOUNTER — Encounter (HOSPITAL_COMMUNITY): Payer: Self-pay | Admitting: *Deleted

## 2011-11-21 DIAGNOSIS — K219 Gastro-esophageal reflux disease without esophagitis: Secondary | ICD-10-CM

## 2011-11-21 DIAGNOSIS — R1013 Epigastric pain: Secondary | ICD-10-CM

## 2011-11-21 DIAGNOSIS — K2990 Gastroduodenitis, unspecified, without bleeding: Secondary | ICD-10-CM

## 2011-11-21 DIAGNOSIS — K2970 Gastritis, unspecified, without bleeding: Secondary | ICD-10-CM

## 2011-11-21 DIAGNOSIS — K3189 Other diseases of stomach and duodenum: Secondary | ICD-10-CM | POA: Insufficient documentation

## 2011-11-21 HISTORY — PX: ESOPHAGOGASTRODUODENOSCOPY: SHX5428

## 2011-11-21 HISTORY — PX: BRAVO PH STUDY: SHX5421

## 2011-11-21 HISTORY — PX: COLONOSCOPY: SHX174

## 2011-11-21 SURGERY — EGD (ESOPHAGOGASTRODUODENOSCOPY)
Anesthesia: Moderate Sedation

## 2011-11-21 MED ORDER — SODIUM CHLORIDE 0.45 % IV SOLN
INTRAVENOUS | Status: DC
  Administered 2011-11-21: 1000 mL via INTRAVENOUS

## 2011-11-21 MED ORDER — MEPERIDINE HCL 100 MG/ML IJ SOLN
INTRAMUSCULAR | Status: AC
  Filled 2011-11-21: qty 2

## 2011-11-21 MED ORDER — MIDAZOLAM HCL 5 MG/5ML IJ SOLN
INTRAMUSCULAR | Status: AC
  Filled 2011-11-21: qty 10

## 2011-11-21 MED ORDER — BUTAMBEN-TETRACAINE-BENZOCAINE 2-2-14 % EX AERO
INHALATION_SPRAY | CUTANEOUS | Status: DC | PRN
  Administered 2011-11-21: 2 via TOPICAL

## 2011-11-21 MED ORDER — STERILE WATER FOR IRRIGATION IR SOLN
Status: DC | PRN
  Administered 2011-11-21: 14:00:00

## 2011-11-21 MED ORDER — MIDAZOLAM HCL 5 MG/5ML IJ SOLN
INTRAMUSCULAR | Status: DC | PRN
  Administered 2011-11-21 (×2): 2 mg via INTRAVENOUS

## 2011-11-21 MED ORDER — MEPERIDINE HCL 100 MG/ML IJ SOLN
INTRAMUSCULAR | Status: DC | PRN
  Administered 2011-11-21 (×2): 25 mg via INTRAVENOUS

## 2011-11-21 NOTE — H&P (View-Only) (Signed)
  Subjective:    Patient ID: Angie Jennings, female    DOB: Mar 16, 1950, 61 y.o.   MRN: [id number]  PCP: Ouida Sills  HPI No problems swallowing. Having trouble with pain in primarily in right ear.3-4 times a week. Coughing and wheezing improved after steroids/zpak aug 2013. CXR: NAIAP, FEELS LIKE ACIPHEX NOT WORKING AND REFLUX WORSE SINCE SHE LOST WEIGHT. WEIGHT 153 LBS IN 2011. ADMITS TO ETOH USE AND DECAF COFFEE. LAST EGD 2011: GASTRITIS, FGP. BRAVO SHOWED NON-ACID REFLUX. WAS ON KAPIDEX AT THE TIME AND STATE SIT CAUSED DIARRHEA. HAS FAILED NEXUM IN THE PAST.  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  Past Medical History  Diagnosis Date  . GERD (gastroesophageal reflux disease)   . Anxiety      Past Surgical History  Procedure Date  . Esophagogastroduodenoscopy 04/20/09    mild gastritis  . Esophagogastroduodenoscopy 07/27/09    small internal hemorrhoids/rare sigmoid colon diverticula other wise no polyps  . Bravo capsule endoscopy 05/02/09    GERD with adequate acid suppression  . Abdominal hysterectomy 2006   Allergies  Allergen Reactions  . Clarithromycin   . Kapidex (Dexlansoprazole) Diarrhea  . Metronidazole   . Nsaids      Current Outpatient Prescriptions  Medication Sig Dispense Refill  . ACIPHEX 20 MG tablet TAKE (1) TABLET BY MOUTH ONCE A DAY IN THE MORNING FOR ACID REFLUX.    . calcium citrate (CALCITRATE - DOSED IN MG ELEMENTAL CALCIUM) 950 MG tablet Take 1 tablet by mouth daily.      [name] Kitchen EST ESTROGENS-METHYLTEST HS 0.625-1.25 MG per tablet Take 1 tablet by mouth daily.           Review of Systems     Objective:   Physical Exam  Constitutional: She is oriented to person, place, and time. She appears well-nourished. No distress.  HENT:  Head: Normocephalic and atraumatic.  Mouth/Throat: Oropharynx is clear and moist. No oropharyngeal exudate.  Eyes: Pupils are equal, round, and reactive to light. No scleral icterus.  Neck: Normal range of  motion. Neck supple.  Cardiovascular: Normal rate, regular rhythm and normal heart sounds.   Pulmonary/Chest: Effort normal and breath sounds normal. No respiratory distress.  Abdominal: Soft. Bowel sounds are normal. She exhibits no distension.  Musculoskeletal: Normal range of motion. She exhibits no edema.  Lymphadenopathy:    She has no cervical adenopathy.  Neurological: She is alert and oriented to person, place, and time.       NO FOCAL DEFICITS   Psychiatric:       SLIGHTLY ANXIOUS MOOD, NL AFFECT          Assessment & Plan:

## 2011-11-21 NOTE — Interval H&P Note (Signed)
History and Physical Interval Note:  11/21/2011 1:45 PM  Angie Jennings  has presented today for surgery, with the diagnosis of Dyspepsia and uncontrolled GERD  The various methods of treatment have been discussed with the patient and family. After consideration of risks, benefits and other options for treatment, the patient has consented to  Procedure(s) (LRB) with comments: ESOPHAGOGASTRODUODENOSCOPY (EGD) (N/A) - 2:00 BRAVO PH STUDY (N/A) as a surgical intervention .  The patient's history has been reviewed, patient examined, no change in status, stable for surgery.  I have reviewed the patient's chart and labs.  Questions were answered to the patient's satisfaction.     [employer]

## 2011-11-21 NOTE — Op Note (Addendum)
[facility] [address] [place], [address]   ENDOSCOPY PROCEDURE REPORT  PATIENT: Angie Jennings, Angie Jennings  MR#: #[id number] BIRTHDATE: [birth year] , 61  yrs. old GENDER: Female  ENDOSCOPIST: Jonette Eva, MD REFERRED Raphael Gibney, M.D.  PROCEDURE DATE: 11/21/2011 PROCEDURE:   EGD w/ Bravo capsule placement and EGD w/ biopsy  INDICATIONS:dyspepsia.   UNCONTROLLED REFLUX. MEDICATIONS: Demerol 50 mg IV and Versed 4 mg IV TOPICAL ANESTHETIC:   Cetacaine Spray  DESCRIPTION OF PROCEDURE:     Physical exam was performed.  Informed consent was obtained from the patient after explaining the benefits, risks, and alternatives to the procedure.  The patient was connected to the monitor and placed in the left lateral position.  Continuous oxygen was provided by nasal cannula and IV medicine administered through an indwelling cannula.  After administration of sedation, the patients esophagus was intubated and the Pentax Gastroscope [id number]  endoscope was advanced under direct visualization to the second portion of the duodenum.  The scope was removed slowly by carefully examining the color, texture, anatomy, and integrity of the mucosa on the way out.  The patient was recovered in endoscopy and discharged home in satisfactory condition.      ESOPHAGUS: The mucosa of the esophagus appeared normal.   GE JUNCTION 43 CM FROM THE TEETH.  CAPSULE PLACED 37 CM FROM THE TEETH.  STOMACH: Non-erosive gastritis (inflammation) was found.  Multiple biopsies were performed.  DUODENUM: The duodenal mucosa showed no abnormalities.   COMPLICATIONS:   None  ENDOSCOPIC IMPRESSION: 1.   The mucosa of the esophagus appeared normal 2.   Non-erosive gastritis (inflammation) was found; multiple biopsies 3.   The duodenal mucosa showed no abnormalities  RECOMMENDATIONS: CONTINUE ACIPHEX BID.  RETURN THE RECORDER ON MONDAY.  FOLLOW A LOW FAT DIET.  BIOPSY & BRAVO RESULTS WITHIN 7  DAYS.  FOLLOW UP IN DEC 2013.   REPEAT EXAM:   _______________________________ Rosalie DoctorJonette Eva, MD 11/21/2011 3:01 PM Revised: 11/21/2011 3:01 PM      PATIENT NAME:  Angie Jennings, Angie Jennings MR#: #[id number]

## 2011-11-24 ENCOUNTER — Telehealth: Payer: Self-pay | Admitting: Gastroenterology

## 2011-11-24 DIAGNOSIS — K219 Gastro-esophageal reflux disease without esophagitis: Secondary | ICD-10-CM

## 2011-11-24 NOTE — Telephone Encounter (Signed)
Called patient TO DISCUSS RESULTS. LVM-CALL [id number] TO DISCUSS.  BRAVO SHOWS NERD V. NUD. RECOMMEND BACLOFEN BID. IF FAILS CONSIDER TCA OR SSRI.

## 2011-11-24 NOTE — Brief Op Note (Signed)
11/21/2011  2:27 PM  PATIENT:  Angie Jennings  61 y.o. female  PRE-OPERATIVE DIAGNOSIS:  Dyspepsia and uncontrolled GERD-LAST BRAVO 2011 : Gastroesophageal reflux disease with adequate acid , possible nonacid reflux-DAY 1: 24 episodes of reflux; DAY 2: 1 episode of reflux. The DeMeester score on day 1 was 7.3, DAY 2: 0.3. Her symptom association probability was 98.4% supine and 98.4% regurgitation. WEIGHT 153 LBS IN 2011 AND NOW 157 LBS.  POST-OPERATIVE DIAGNOSIS:  GE junction 43, Gastric Reflux, Gastritis  PROCEDURE:  Procedure(s) (LRB) with comments: ESOPHAGOGASTRODUODENOSCOPY (EGD) (N/A) - 2:00 BRAVO PH STUDY (N/A) ON ACIPHEX BID. OFF BACLOFEN.  SURGEON:  Surgeon(s) and Role:    * West Bali, MD - Primary   PROCEDURE:  Procedure(s): BRAVO PH STUDY ON ACIPHEX BID  SURGEON:  Surgeon(s): Arlyce Harman, MD  FINDINGS:  PT HAD RECORDER FOR 48 HOURS. STUDY DURATION: 1d 16.52.   FRACTION Ph TOTAL: 1.6 supine 3.6 upright   # OF REFLUXES: 6   LONG REFLUX > 5: 1   LONGEST REFLUX: 18 MINS   DEMEESTER SCORE DAY 1: 6.6 (NL < 14.72) Day 2: 0.5 TOTAL: 4.7    SAP TABLE: 100% SUPINE DIAGNOSIS: NON-ACID REFLUX V. NON-ULCER DYSPEPSIA  PLAN: 1. BACLOFEN QAC BID 2. CONTINUE ACIPHEX. 3. OPV IN DEC 2013-Consider TCA/SSRI IF SX NOT IMPROVED.

## 2011-11-26 ENCOUNTER — Encounter (HOSPITAL_COMMUNITY): Payer: Self-pay | Admitting: Gastroenterology

## 2011-11-27 MED ORDER — BACLOFEN 10 MG PO TABS: ORAL_TABLET | ORAL | Status: DC

## 2011-11-27 NOTE — Telephone Encounter (Signed)
Results faxed to PCP, recall made  

## 2011-11-27 NOTE — Addendum Note (Signed)
Addended by: West Bali on: 11/27/2011 09:20 AM   Modules accepted: Orders

## 2011-11-27 NOTE — Telephone Encounter (Addendum)
Called patient TO DISCUSS RESULTS. DISCUSSED BRAVO AND BX FINDINGS. PT DOESN'T REMEMBER TAKING BACLOFEN IN 2011. NO KNOWN SIDE EFFECTS TO THE DRUG. CONTINUE ACIPHEX BID. BACLOFEN BID. MED WARNINGS GIVEN(DIZZINESS/SEDATION). PT INSTRUCTED TO TAKE FIRST DOSE AND WAIT 2-3 HOURS TO SEE IF SHE WILL HAVE AN ADVERSE REACTION.  OPV IN DEC 2013.

## 2012-01-13 ENCOUNTER — Encounter: Payer: Self-pay | Admitting: *Deleted

## 2012-01-26 ENCOUNTER — Other Ambulatory Visit: Payer: Self-pay

## 2012-01-26 MED ORDER — RABEPRAZOLE SODIUM 20 MG PO TBEC: 20.0000 mg | DELAYED_RELEASE_TABLET | Freq: Two times a day (BID) | ORAL | Status: DC

## 2012-12-20 ENCOUNTER — Other Ambulatory Visit: Payer: Self-pay | Admitting: Obstetrics and Gynecology

## 2012-12-21 NOTE — Telephone Encounter (Signed)
Patient has appt pending at [facility].

## 2013-04-13 ENCOUNTER — Other Ambulatory Visit: Payer: Self-pay | Admitting: Urgent Care

## 2013-05-02 ENCOUNTER — Other Ambulatory Visit: Payer: Self-pay | Admitting: Obstetrics and Gynecology

## 2013-05-02 ENCOUNTER — Telehealth: Payer: Self-pay | Admitting: Obstetrics and Gynecology

## 2013-05-02 MED ORDER — HYDROCODONE-HOMATROPINE 5-1.5 MG/5ML PO SYRP: 5.0000 mL | ORAL_SOLUTION | Freq: Four times a day (QID) | ORAL | Status: DC | PRN

## 2013-05-02 NOTE — Telephone Encounter (Signed)
Patient has had 3 days of progressive sore throat, with purulent tonsils, now responding to PCN . Pt has had severe coughing x 12 hours, due to throat discomfort. Chest clear.  A Pharyngeal irritation P Hydromet  Rx to [facility].

## 2013-05-25 ENCOUNTER — Other Ambulatory Visit: Payer: Self-pay | Admitting: Obstetrics and Gynecology

## 2013-05-27 NOTE — Telephone Encounter (Signed)
Continued use of E/T planned, discussed with patient. Dr Toney Rakes aware.

## 2013-08-10 ENCOUNTER — Other Ambulatory Visit: Payer: Self-pay | Admitting: Obstetrics and Gynecology

## 2013-09-07 ENCOUNTER — Other Ambulatory Visit: Payer: Self-pay | Admitting: Obstetrics and Gynecology

## 2013-09-07 DIAGNOSIS — Z1231 Encounter for screening mammogram for malignant neoplasm of breast: Secondary | ICD-10-CM

## 2013-09-08 ENCOUNTER — Ambulatory Visit (HOSPITAL_COMMUNITY)
Admission: RE | Admit: 2013-09-08 | Discharge: 2013-09-08 | Disposition: A | Discharge status: Home / Self Care | Payer: BC Managed Care – PPO | Source: Ambulatory Visit | Attending: Obstetrics and Gynecology | Admitting: Obstetrics and Gynecology

## 2013-09-08 DIAGNOSIS — Z1231 Encounter for screening mammogram for malignant neoplasm of breast: Secondary | ICD-10-CM | POA: Insufficient documentation

## 2013-09-14 ENCOUNTER — Other Ambulatory Visit: Payer: Self-pay | Admitting: Obstetrics and Gynecology

## 2013-09-14 DIAGNOSIS — R928 Other abnormal and inconclusive findings on diagnostic imaging of breast: Secondary | ICD-10-CM

## 2013-09-17 HISTORY — PX: BREAST SURGERY: SHX581

## 2013-09-20 ENCOUNTER — Other Ambulatory Visit: Payer: Self-pay | Admitting: Obstetrics and Gynecology

## 2013-09-20 ENCOUNTER — Ambulatory Visit (HOSPITAL_COMMUNITY)
Admission: RE | Admit: 2013-09-20 | Discharge: 2013-09-20 | Disposition: A | Discharge status: Home / Self Care | Payer: BC Managed Care – PPO | Source: Ambulatory Visit | Attending: Obstetrics and Gynecology | Admitting: Obstetrics and Gynecology

## 2013-09-20 ENCOUNTER — Other Ambulatory Visit: Payer: Self-pay | Admitting: Certified Registered Nurse Anesthetist

## 2013-09-20 DIAGNOSIS — R921 Mammographic calcification found on diagnostic imaging of breast: Secondary | ICD-10-CM

## 2013-09-20 DIAGNOSIS — R928 Other abnormal and inconclusive findings on diagnostic imaging of breast: Secondary | ICD-10-CM | POA: Insufficient documentation

## 2013-09-23 ENCOUNTER — Ambulatory Visit
Admission: RE | Admit: 2013-09-23 | Discharge: 2013-09-23 | Disposition: A | Discharge status: Home / Self Care | Payer: BC Managed Care – PPO | Source: Ambulatory Visit | Attending: Obstetrics and Gynecology | Admitting: Obstetrics and Gynecology

## 2013-09-23 DIAGNOSIS — R921 Mammographic calcification found on diagnostic imaging of breast: Secondary | ICD-10-CM

## 2013-11-30 ENCOUNTER — Other Ambulatory Visit: Payer: Self-pay | Admitting: Gastroenterology

## 2013-12-07 ENCOUNTER — Ambulatory Visit (INDEPENDENT_AMBULATORY_CARE_PROVIDER_SITE_OTHER): Payer: BC Managed Care – PPO | Admitting: Gynecology

## 2013-12-07 ENCOUNTER — Other Ambulatory Visit (HOSPITAL_COMMUNITY)
Admission: RE | Admit: 2013-12-07 | Discharge: 2013-12-07 | Disposition: A | Discharge status: Home / Self Care | Payer: BC Managed Care – PPO | Source: Ambulatory Visit | Attending: Gynecology | Admitting: Gynecology

## 2013-12-07 ENCOUNTER — Encounter: Payer: Self-pay | Admitting: Gynecology

## 2013-12-07 VITALS — BP 162/94 | Ht 67.5 in | Wt 163.0 lb

## 2013-12-07 DIAGNOSIS — Z01419 Encounter for gynecological examination (general) (routine) without abnormal findings: Secondary | ICD-10-CM

## 2013-12-07 DIAGNOSIS — Z1151 Encounter for screening for human papillomavirus (HPV): Secondary | ICD-10-CM | POA: Diagnosis present

## 2013-12-07 DIAGNOSIS — Z79890 Hormone replacement therapy (postmenopausal): Secondary | ICD-10-CM

## 2013-12-07 DIAGNOSIS — N951 Menopausal and female climacteric states: Secondary | ICD-10-CM

## 2013-12-07 LAB — CBC WITH DIFFERENTIAL/PLATELET
BASOS ABS: 0 10*3/uL (ref 0.0–0.1)
Basophils Relative: 0 % (ref 0–1)
EOS PCT: 4 % (ref 0–5)
Eosinophils Absolute: 0.4 10*3/uL (ref 0.0–0.7)
HCT: 43.7 % (ref 36.0–46.0)
Hemoglobin: 14.8 g/dL (ref 12.0–15.0)
LYMPHS PCT: 30 % (ref 12–46)
Lymphs Abs: 2.7 10*3/uL (ref 0.7–4.0)
MCH: 29.9 pg (ref 26.0–34.0)
MCHC: 33.9 g/dL (ref 30.0–36.0)
MCV: 88.3 fL (ref 78.0–100.0)
Monocytes Absolute: 0.5 10*3/uL (ref 0.1–1.0)
Monocytes Relative: 6 % (ref 3–12)
NEUTROS ABS: 5.3 10*3/uL (ref 1.7–7.7)
NEUTROS PCT: 60 % (ref 43–77)
PLATELETS: 262 10*3/uL (ref 150–400)
RBC: 4.95 MIL/uL (ref 3.87–5.11)
RDW: 14 % (ref 11.5–15.5)
WBC: 8.9 10*3/uL (ref 4.0–10.5)

## 2013-12-07 LAB — HEMOGLOBIN A1C
HEMOGLOBIN A1C: 5.8 % — AB (ref <5.7)
Mean Plasma Glucose: 120 mg/dL — ABNORMAL HIGH (ref <117)

## 2013-12-07 LAB — TSH: TSH: 2.105 u[IU]/mL (ref 0.350–4.500)

## 2013-12-07 MED ORDER — NONFORMULARY OR COMPOUNDED ITEM: Status: DC

## 2013-12-07 MED ORDER — VENLAFAXINE HCL 37.5 MG PO TABS: ORAL_TABLET | ORAL | Status: DC

## 2013-12-07 NOTE — Patient Instructions (Signed)
Influenza Virus Vaccine injection (Fluarix) What is this medicine? INFLUENZA VIRUS VACCINE (in floo EN zuh VAHY ruhs vak SEEN) helps to reduce the risk of getting influenza also known as the flu. This medicine may be used for other purposes; ask your health care provider or pharmacist if you have questions. COMMON BRAND NAME(S): Fluarix, Fluzone What should I tell my health care provider before I take this medicine? They need to know if you have any of these conditions: -bleeding disorder like hemophilia -fever or infection -Guillain-Barre syndrome or other neurological problems -immune system problems -infection with the human immunodeficiency virus (HIV) or AIDS -low blood platelet counts -multiple sclerosis -an unusual or allergic reaction to influenza virus vaccine, eggs, chicken proteins, latex, gentamicin, other medicines, foods, dyes or preservatives -pregnant or trying to get pregnant -breast-feeding How should I use this medicine? This vaccine is for injection into a muscle. It is given by a health care professional. A copy of Vaccine Information Statements will be given before each vaccination. Read this sheet carefully each time. The sheet may change frequently. Talk to your pediatrician regarding the use of this medicine in children. Special care may be needed. Overdosage: If you think you have taken too much of this medicine contact a poison control center or emergency room at once. NOTE: This medicine is only for you. Do not share this medicine with others. What if I miss a dose? This does not apply. What may interact with this medicine? -chemotherapy or radiation therapy -medicines that lower your immune system like etanercept, anakinra, infliximab, and adalimumab -medicines that treat or prevent blood clots like warfarin -phenytoin -steroid medicines like prednisone or cortisone -theophylline -vaccines This list may not describe all possible interactions. Give your  health care provider a list of all the medicines, herbs, non-prescription drugs, or dietary supplements you use. Also tell them if you smoke, drink alcohol, or use illegal drugs. Some items may interact with your medicine. What should I watch for while using this medicine? Report any side effects that do not go away within 3 days to your doctor or health care professional. Call your health care provider if any unusual symptoms occur within 6 weeks of receiving this vaccine. You may still catch the flu, but the illness is not usually as bad. You cannot get the flu from the vaccine. The vaccine will not protect against colds or other illnesses that may cause fever. The vaccine is needed every year. What side effects may I notice from receiving this medicine? Side effects that you should report to your doctor or health care professional as soon as possible: -allergic reactions like skin rash, itching or hives, swelling of the face, lips, or tongue Side effects that usually do not require medical attention (report to your doctor or health care professional if they continue or are bothersome): -fever -headache -muscle aches and pains -pain, tenderness, redness, or swelling at site where injected -weak or tired This list may not describe all possible side effects. Call your doctor for medical advice about side effects. You may report side effects to FDA at 1-800-FDA-1088. Where should I keep my medicine? This vaccine is only given in a clinic, pharmacy, doctor's office, or other health care setting and will not be stored at home. NOTE: This sheet is a summary. It may not cover all possible information. If you have questions about this medicine, talk to your doctor, pharmacist, or health care provider.  2015, Elsevier/Gold Standard. (2007-09-01 09:30:40) Tdap Vaccine (Tetanus, Diphtheria, Pertussis):  What You Need to Know 1. Why get vaccinated? Tetanus, diphtheria and pertussis can be very serious  diseases, even for adolescents and adults. Tdap vaccine can protect [place] from these diseases. TETANUS (Lockjaw) causes painful muscle tightening and stiffness, usually all over the body.  It can lead to tightening of muscles in the head and neck so you can't open your mouth, swallow, or sometimes even breathe. Tetanus kills about 1 out of 5 people who are infected. DIPHTHERIA can cause a thick coating to form in the back of the throat.  It can lead to breathing problems, paralysis, heart failure, and death. PERTUSSIS (Whooping Cough) causes severe coughing spells, which can cause difficulty breathing, vomiting and disturbed sleep.  It can also lead to weight loss, incontinence, and rib fractures. Up to 2 in 100 adolescents and 5 in 100 adults with pertussis are hospitalized or have complications, which could include pneumonia or death. These diseases are caused by bacteria. Diphtheria and pertussis are spread from person to person through coughing or sneezing. Tetanus enters the body through cuts, scratches, or wounds. Before vaccines, the [place] saw as many as 200,000 cases a year of diphtheria and pertussis, and hundreds of cases of tetanus. Since vaccination began, tetanus and diphtheria have dropped by about 99% and pertussis by about 80%. 2. Tdap vaccine Tdap vaccine can protect adolescents and adults from tetanus, diphtheria, and pertussis. One dose of Tdap is routinely given at age 31 or 51. People who did not get Tdap at that age should get it as soon as possible. Tdap is especially important for health care professionals and anyone having close contact with a baby younger than 12 months. Pregnant women should get a dose of Tdap during every pregnancy, to protect the newborn from pertussis. Infants are most at risk for severe, life-threatening complications from pertussis. A similar vaccine, called Td, protects from tetanus and diphtheria, but not pertussis. A Td booster should be given  every 10 years. Tdap may be given as one of these boosters if you have not already gotten a dose. Tdap may also be given after a severe cut or burn to prevent tetanus infection. Your doctor can give you more information. Tdap may safely be given at the same time as other vaccines. 3. Some people should not get this vaccine  If you ever had a life-threatening allergic reaction after a dose of any tetanus, diphtheria, or pertussis containing vaccine, OR if you have a severe allergy to any part of this vaccine, you should not get Tdap. Tell your doctor if you have any severe allergies.  If you had a coma, or long or multiple seizures within 7 days after a childhood dose of DTP or DTaP, you should not get Tdap, unless a cause other than the vaccine was found. You can still get Td.  Talk to your doctor if you:  have epilepsy or another nervous system problem,  had severe pain or swelling after any vaccine containing diphtheria, tetanus or pertussis,  ever had Guillain-Barr Syndrome (GBS),  aren't feeling well on the day the shot is scheduled. 4. Risks of a vaccine reaction With any medicine, including vaccines, there is a chance of side effects. These are usually mild and go away on their own, but serious reactions are also possible. Brief fainting spells can follow a vaccination, leading to injuries from falling. Sitting or lying down for about 15 minutes can help prevent these. Tell your doctor if you feel dizzy or light-headed, or  have vision changes or ringing in the ears. Mild problems following Tdap (Did not interfere with activities)  Pain where the shot was given (about 3 in 4 adolescents or 2 in 3 adults)  Redness or swelling where the shot was given (about 1 person in 5)  Mild fever of at least 100.26F (up to about 1 in 25 adolescents or 1 in 100 adults)  Headache (about 3 or 4 people in 10)  Tiredness (about 1 person in 3 or 4)  Nausea, vomiting, diarrhea, stomach ache (up to  1 in 4 adolescents or 1 in 10 adults)  Chills, body aches, sore joints, rash, swollen glands (uncommon) Moderate problems following Tdap (Interfered with activities, but did not require medical attention)  Pain where the shot was given (about 1 in 5 adolescents or 1 in 100 adults)  Redness or swelling where the shot was given (up to about 1 in 16 adolescents or 1 in 25 adults)  Fever over 102F (about 1 in 100 adolescents or 1 in 250 adults)  Headache (about 3 in 20 adolescents or 1 in 10 adults)  Nausea, vomiting, diarrhea, stomach ache (up to 1 or 3 people in 100)  Swelling of the entire arm where the shot was given (up to about 3 in 100). Severe problems following Tdap (Unable to perform usual activities; required medical attention)  Swelling, severe pain, bleeding and redness in the arm where the shot was given (rare). A severe allergic reaction could occur after any vaccine (estimated less than 1 in a million doses). 5. What if there is a serious reaction? What should I look for?  Look for anything that concerns you, such as signs of a severe allergic reaction, very high fever, or behavior changes. Signs of a severe allergic reaction can include hives, swelling of the face and throat, difficulty breathing, a fast heartbeat, dizziness, and weakness. These would start a few minutes to a few hours after the vaccination. What should I do?  If you think it is a severe allergic reaction or other emergency that can't wait, call 9-1-1 or get the person to the nearest hospital. Otherwise, call your doctor.  Afterward, the reaction should be reported to the "Vaccine Adverse Event Reporting System" (VAERS). Your doctor might file this report, or you can do it yourself through the VAERS web site at www.vaers.SamedayNews.es, or by calling (854)244-9874. VAERS is only for reporting reactions. They do not give medical advice.  6. The National Vaccine Injury Compensation Program The [employer] Vaccine  Injury Compensation Program (VICP) is a federal program that was created to compensate people who may have been injured by certain vaccines. Persons who believe they may have been injured by a vaccine can learn about the program and about filing a claim by calling 479 625 8608 or visiting the [place] website at GoldCloset.com.ee. 7. How can I learn more?  Ask your doctor.  Call your local or state health department.  Contact the Centers for Disease Control and Prevention (CDC):  Call 260-624-1076 or visit CDC's website at http://hunter.com/. CDC Tdap Vaccine VIS (06/26/11) Document Released: 08/05/2011 Document Revised: 06/20/2013 Document Reviewed: 05/18/2013 ExitCare Patient Information 2015 [place], [place]. This information is not intended to replace advice given to you by your health care provider. Make sure you discuss any questions you have with your health care provider.  Shingles Vaccine What You Need to Know WHAT IS SHINGLES?  Shingles is a painful skin rash, often with blisters. It is also called Herpes Zoster or just  Zoster.  A shingles rash usually appears on one side of the face or body and lasts from 2 to 4 weeks. Its main symptom is pain, which can be quite severe. Other symptoms of shingles can include fever, headache, chills, and upset stomach. Very rarely, a shingles infection can lead to pneumonia, hearing problems, blindness, brain inflammation (encephalitis), or death.  For about 1 person in 5, severe pain can continue even after the rash clears up. This is called post-herpetic neuralgia.  Shingles is caused by the Varicella Zoster virus. This is the same virus that causes chickenpox. Only someone who has had a case of chickenpox or rarely, has gotten chickenpox vaccine, can get shingles. The virus stays in your body. It can reappear many years later to cause a case of shingles.  You cannot catch shingles from another person with shingles. However, a  person who has never had chickenpox (or chickenpox vaccine) could get chickenpox from someone with shingles. This is not very common.  Shingles is far more common in people 70 and older than in younger people. It is also more common in people whose immune systems are weakened because of a disease such as cancer or drugs such as steroids or chemotherapy.  At least 1 million people get shingles per year in the [place]. SHINGLES VACCINE  A vaccine for shingles was licensed in 8099. In clinical trials, the vaccine reduced the risk of shingles by 50%. It can also reduce the pain in people who still get shingles after being vaccinated.  A single dose of shingles vaccine is recommended for adults 6 years of age and older. SOME PEOPLE SHOULD NOT GET SHINGLES VACCINE OR SHOULD WAIT A person should not get shingles vaccine if he or she:  Has ever had a life-threatening allergic reaction to gelatin, the antibiotic neomycin, or any other component of shingles vaccine. Tell your caregiver if you have any severe allergies.  Has a weakened immune system because of current:  AIDS or another disease that affects the immune system.  Treatment with drugs that affect the immune system, such as prolonged use of high-dose steroids.  Cancer treatment, such as radiation or chemotherapy.  Cancer affecting the bone marrow or lymphatic system, such as leukemia or lymphoma.  Is pregnant, or might be pregnant. Women should not become pregnant until at least 4 weeks after getting shingles vaccine. Someone with a minor illness, such as a cold, may be vaccinated. Anyone with a moderate or severe acute illness should usually wait until he or she recovers before getting the vaccine. This includes anyone with a temperature of 101.3 F (38 C) or higher. WHAT ARE THE RISKS FROM SHINGLES VACCINE?  A vaccine, like any medicine, could possibly cause serious problems, such as severe allergic reactions. However, the risk  of a vaccine causing serious harm, or death, is extremely small.  No serious problems have been identified with shingles vaccine. Mild Problems  Redness, soreness, swelling, or itching at the site of the injection (about 1 person in 3).  Headache (about 1 person in 10). Like all vaccines, shingles vaccine is being closely monitored for unusual or severe problems. WHAT IF THERE IS A MODERATE OR SEVERE REACTION? What should I look for? Any unusual condition, such as a severe allergic reaction or a high fever. If a severe allergic reaction occurred, it would be within a few minutes to an hour after the shot. Signs of a serious allergic reaction can include difficulty breathing, weakness, hoarseness or  wheezing, a fast heartbeat, hives, dizziness, paleness, or swelling of the throat. What should I do?  Call your caregiver, or get the person to a caregiver right away.  Tell the caregiver what happened, the date and time it happened, and when the vaccination was given.  Ask the caregiver to report the reaction by filing a Vaccine Adverse Event Reporting System (VAERS) form. Or, you can file this report through the VAERS web site at www.vaers.SamedayNews.es or by calling [phone]. VAERS does not provide medical advice. HOW CAN I LEARN MORE?  Ask your caregiver. He or she can give you the vaccine package insert or suggest other sources of information.  Contact the Centers for Disease Control and Prevention (CDC):  Call 220-555-2577 (1-800-CDC-INFO).  Visit the CDC website at http://hunter.com/ CDC Shingles Vaccine VIS (11/23/07) Document Released: 12/01/2005 Document Revised: 04/28/2011 Document Reviewed: 05/26/2012 [facility] Patient Information 2015 [place]. This information is not intended to replace advice given to you by your health care provider. Make sure you discuss any questions you have with your health care provider.

## 2013-12-07 NOTE — Progress Notes (Signed)
Angie Jennings October 14, 1950 [id number]   History:    63 y.o.  For annual exam who has not been seen the office for over 5 years. Patient had a total abdominal hysterectomy back in 2006. She has been on Estratest for almost 10 years for vasomotor symptoms. She has started to taper off and has been now off for several months and is complaining of hot flashes mood swings and her ability. Her blood pressure when she came in was 162/94 on repeat 150/88. She has not seen her PCP in a year. Patient has not had a bone density study in many years. Patient with no past history of abnormal Pap smears. Patient's colonoscopy is up-to-date. Last colonoscopy 2011 no polyps detected.  Patient had left breast stereotactic biopsy this year with the following result: Breast, left, needle core biopsy, LOQ - BENIGN BREAST TISSUE, SEE COMMENT. - NEGATIVE FOR ATYPIA OR MALIGNANCY. - CALCIFICATIONS IDENTIFIED.   Past medical history,surgical history, family history and social history were all reviewed and documented in the EPIC chart.  Gynecologic History No LMP recorded. Patient has had a hysterectomy. Contraception: status post hysterectomy Last Pap: Over 5 years ago. Results were: normal Last mammogram: 2015. Results were: See above  Obstetric History OB History  Gravida Para Term Preterm AB SAB TAB Ectopic Multiple Living  5 3   2 2    3     # Outcome Date GA Lbr Len/2nd Weight Sex Delivery Anes PTL Lv  5 SAB           4 SAB           3 PAR           2 PAR           1 PAR                ROS: A ROS was performed and pertinent positives and negatives are included in the history.  GENERAL: No fevers or chills. HEENT: No change in vision, no earache, sore throat or sinus congestion. NECK: No pain or stiffness. CARDIOVASCULAR: No chest pain or pressure. No palpitations. PULMONARY: No shortness of breath, cough or wheeze. GASTROINTESTINAL: No abdominal pain, nausea, vomiting or diarrhea, melena or  bright red blood per rectum. GENITOURINARY: No urinary frequency, urgency, hesitancy or dysuria. MUSCULOSKELETAL: No joint or muscle pain, no back pain, no recent trauma. DERMATOLOGIC: No rash, no itching, no lesions. ENDOCRINE: No polyuria, polydipsia, no heat or cold intolerance. No recent change in weight. HEMATOLOGICAL: No anemia or easy bruising or bleeding. NEUROLOGIC: No headache, seizures, numbness, tingling or weakness. PSYCHIATRIC: No depression, no loss of interest in normal activity or change in sleep pattern.     Exam: chaperone present  BP 162/94  Ht 5' 7.5" (1.715 m)  Wt 163 lb (73.936 kg)  BMI 25.14 kg/m2  Body mass index is 25.14 kg/(m^2).  General appearance : Well developed well nourished female. No acute distress HEENT: Neck supple, trachea midline, no carotid bruits, no thyroidmegaly Lungs: Clear to auscultation, no rhonchi or wheezes, or rib retractions  Heart: Regular rate and rhythm, no murmurs or gallops Breast:Examined in sitting and supine position were symmetrical in appearance, no palpable masses or tenderness,  no skin retraction, no nipple inversion, no nipple discharge, no skin discoloration, no axillary or supraclavicular lymphadenopathy Abdomen: no palpable masses or tenderness, no rebound or guarding Extremities: no edema or skin discoloration or tenderness  Pelvic:  Bartholin, Urethra, Skene Glands: Within normal limits  Vagina: No gross lesions or discharge  Cervix: Absent  Uterus   Absent  Adnexa  Without masses or tenderness  Anus and perineum  normal   Rectovaginal  normal sphincter tone without palpated masses or tenderness             Hemoccult cards provided     Assessment/Plan:  63 y.o. female for annual exam who has tapered off 63 years of age RT. We discussed alternative treatments for vasomotor symptoms. She will be prescribed Effexor extended release at 37.5 mg 1 by mouth daily. For her vaginal atrophy she will be prescribed  vaginal estradiol 0.02% to apply twice a week. We are going to check her TSH, hemoglobin A1c and CBC because of her tiredness and fatigue. She will followup with her PCP next week for fasting lipid profile and complete blood work. She will continue to monitor her blood pressure readings of since her husband is a physician and bring that information to her PCP next week. Repeat blood pressure was 142/82. We discussed importance of regular exercise and low-salt diet. We discussed importance of monthly breast exams. We discussed importance of calcium and vitamin D for osteoporosis prevention. Patient will schedule for her bone density study in the next few weeks. Pap smear was done today one last time and we will begin to adhere to the new guidelines. She was reminded to submit to the office the fecal Hemoccult cards for testing. Prescription for her to obtain shingles vaccine provided. Patient did not receive flu vaccine today because she is getting over a mild upper respiratory tract infection.   Terrance Mass MD, 12:03 PM 12/07/2013

## 2013-12-08 LAB — CYTOLOGY - PAP

## 2013-12-13 ENCOUNTER — Telehealth: Payer: Self-pay | Admitting: *Deleted

## 2013-12-13 MED ORDER — VENLAFAXINE HCL 37.5 MG PO TABS: ORAL_TABLET | ORAL | Status: DC

## 2013-12-13 NOTE — Telephone Encounter (Signed)
Pt Rx for Effexor 37.5 mg was never sent to pharmacy on 12/07/13 OV. Rx will be re-sent.

## 2013-12-19 ENCOUNTER — Encounter: Payer: Self-pay | Admitting: Gynecology

## 2013-12-22 ENCOUNTER — Ambulatory Visit (INDEPENDENT_AMBULATORY_CARE_PROVIDER_SITE_OTHER): Payer: BC Managed Care – PPO

## 2013-12-22 ENCOUNTER — Other Ambulatory Visit: Payer: Self-pay | Admitting: Gynecology

## 2013-12-22 DIAGNOSIS — M8580 Other specified disorders of bone density and structure, unspecified site: Secondary | ICD-10-CM

## 2013-12-22 DIAGNOSIS — N951 Menopausal and female climacteric states: Secondary | ICD-10-CM

## 2014-01-17 ENCOUNTER — Other Ambulatory Visit: Payer: BC Managed Care – PPO | Admitting: Anesthesiology

## 2014-01-17 DIAGNOSIS — Z1211 Encounter for screening for malignant neoplasm of colon: Secondary | ICD-10-CM

## 2014-04-24 ENCOUNTER — Ambulatory Visit (HOSPITAL_COMMUNITY)
Admission: RE | Admit: 2014-04-24 | Discharge: 2014-04-24 | Disposition: A | Discharge status: Home / Self Care | Payer: BC Managed Care – PPO | Source: Ambulatory Visit | Attending: Internal Medicine | Admitting: Internal Medicine

## 2014-04-24 ENCOUNTER — Other Ambulatory Visit (HOSPITAL_COMMUNITY): Payer: Self-pay | Admitting: Internal Medicine

## 2014-04-24 DIAGNOSIS — R109 Unspecified abdominal pain: Secondary | ICD-10-CM | POA: Insufficient documentation

## 2014-09-05 ENCOUNTER — Other Ambulatory Visit (HOSPITAL_COMMUNITY): Payer: Self-pay | Admitting: Internal Medicine

## 2014-09-05 DIAGNOSIS — Z1231 Encounter for screening mammogram for malignant neoplasm of breast: Secondary | ICD-10-CM

## 2014-09-18 DIAGNOSIS — C50919 Malignant neoplasm of unspecified site of unspecified female breast: Secondary | ICD-10-CM

## 2014-09-18 HISTORY — DX: Malignant neoplasm of unspecified site of unspecified female breast: C50.919

## 2014-09-25 ENCOUNTER — Ambulatory Visit (HOSPITAL_COMMUNITY): Payer: BC Managed Care – PPO

## 2014-09-27 ENCOUNTER — Ambulatory Visit (HOSPITAL_COMMUNITY)
Admission: RE | Admit: 2014-09-27 | Discharge: 2014-09-27 | Disposition: A | Discharge status: Home / Self Care | Payer: BC Managed Care – PPO | Source: Ambulatory Visit | Attending: Internal Medicine | Admitting: Internal Medicine

## 2014-09-27 ENCOUNTER — Ambulatory Visit (HOSPITAL_COMMUNITY): Payer: BC Managed Care – PPO

## 2014-09-27 DIAGNOSIS — Z1231 Encounter for screening mammogram for malignant neoplasm of breast: Secondary | ICD-10-CM | POA: Diagnosis present

## 2014-09-28 ENCOUNTER — Ambulatory Visit (HOSPITAL_COMMUNITY): Payer: BC Managed Care – PPO

## 2014-09-29 ENCOUNTER — Ambulatory Visit (INDEPENDENT_AMBULATORY_CARE_PROVIDER_SITE_OTHER): Payer: BC Managed Care – PPO | Admitting: Internal Medicine

## 2014-09-29 ENCOUNTER — Other Ambulatory Visit: Payer: Self-pay | Admitting: Internal Medicine

## 2014-09-29 DIAGNOSIS — R928 Other abnormal and inconclusive findings on diagnostic imaging of breast: Secondary | ICD-10-CM

## 2014-09-29 DIAGNOSIS — Z7184 Encounter for health counseling related to travel: Secondary | ICD-10-CM | POA: Insufficient documentation

## 2014-09-29 DIAGNOSIS — Z7189 Other specified counseling: Secondary | ICD-10-CM

## 2014-09-29 LAB — HEPATITIS B SURFACE ANTIBODY, QUANTITATIVE: Hepatitis B-Post: 0 m[IU]/mL

## 2014-09-29 MED ORDER — TYPHOID VACCINE PO CPDR: 1.0000 | DELAYED_RELEASE_CAPSULE | ORAL | Status: DC

## 2014-09-29 MED ORDER — CIPROFLOXACIN HCL 500 MG PO TABS: 500.0000 mg | ORAL_TABLET | Freq: Two times a day (BID) | ORAL | Status: DC

## 2014-09-29 MED ORDER — DOXYCYCLINE HYCLATE 100 MG PO TABS: 100.0000 mg | ORAL_TABLET | Freq: Every day | ORAL | Status: DC

## 2014-09-29 NOTE — Progress Notes (Signed)
Subjective:   Angie Jennings is a 64 y.o. female who presents to the Infectious Disease clinic for travel consultation. Planned departure date: September 2016          Planned return date: 1 weeks Countries of travel: [place] Areas in country: [place]   Accommodations: hotel Purpose of travel: health care Prior travel out of [place]: yes     Objective:   Medications:reviewed    Assessment:    No contraindications to travel. none     Plan:    Issues discussed: freshwater swimming, future shots, insect-borne illnesses, malaria, MVA safety, rabies, safe food/water, traveler's diarrhea, website/handouts for more information, what to do if ill upon return and what to do if ill while there. Immunizations recommended: Hepatitis A series, Td and Typhoid (oral). Malaria prophylaxis: doxycycline, daily dose starting 2 days before entering endemic area, ending 4 weeks after leaving area Traveler's diarrhea prophylaxis: ciprofloxacin. Total duration of visit: 1 Hour. Total time spent on education, counseling, coordination of care: 30 Minutes.

## 2014-10-02 ENCOUNTER — Ambulatory Visit (INDEPENDENT_AMBULATORY_CARE_PROVIDER_SITE_OTHER): Payer: BC Managed Care – PPO | Admitting: *Deleted

## 2014-10-02 DIAGNOSIS — Z7184 Encounter for health counseling related to travel: Secondary | ICD-10-CM

## 2014-10-02 DIAGNOSIS — Z23 Encounter for immunization: Secondary | ICD-10-CM | POA: Diagnosis not present

## 2014-10-02 DIAGNOSIS — Z7189 Other specified counseling: Secondary | ICD-10-CM

## 2014-10-02 DIAGNOSIS — Z789 Other specified health status: Secondary | ICD-10-CM | POA: Diagnosis not present

## 2014-10-03 ENCOUNTER — Other Ambulatory Visit: Payer: Self-pay | Admitting: Internal Medicine

## 2014-10-03 DIAGNOSIS — R928 Other abnormal and inconclusive findings on diagnostic imaging of breast: Secondary | ICD-10-CM

## 2014-10-05 ENCOUNTER — Ambulatory Visit
Admission: RE | Admit: 2014-10-05 | Discharge: 2014-10-05 | Disposition: A | Discharge status: Home / Self Care | Payer: BC Managed Care – PPO | Source: Ambulatory Visit | Attending: Internal Medicine | Admitting: Internal Medicine

## 2014-10-05 ENCOUNTER — Other Ambulatory Visit: Payer: Self-pay | Admitting: Internal Medicine

## 2014-10-05 DIAGNOSIS — R928 Other abnormal and inconclusive findings on diagnostic imaging of breast: Secondary | ICD-10-CM

## 2014-10-05 HISTORY — PX: BREAST BIOPSY: SHX20

## 2014-10-06 ENCOUNTER — Other Ambulatory Visit: Payer: Self-pay | Admitting: Internal Medicine

## 2014-10-06 DIAGNOSIS — Z853 Personal history of malignant neoplasm of breast: Secondary | ICD-10-CM

## 2014-10-10 ENCOUNTER — Encounter (HOSPITAL_COMMUNITY): Payer: BC Managed Care – PPO | Attending: Hematology & Oncology | Admitting: Hematology & Oncology

## 2014-10-10 ENCOUNTER — Encounter (HOSPITAL_COMMUNITY): Payer: Self-pay | Admitting: Hematology & Oncology

## 2014-10-10 ENCOUNTER — Encounter (HOSPITAL_COMMUNITY): Payer: Self-pay | Admitting: Lab

## 2014-10-10 VITALS — BP 159/86 | HR 91 | Temp 97.9°F | Resp 14 | Ht 68.5 in | Wt 158.3 lb

## 2014-10-10 DIAGNOSIS — D0512 Intraductal carcinoma in situ of left breast: Secondary | ICD-10-CM

## 2014-10-10 DIAGNOSIS — M8580 Other specified disorders of bone density and structure, unspecified site: Secondary | ICD-10-CM

## 2014-10-10 DIAGNOSIS — Z801 Family history of malignant neoplasm of trachea, bronchus and lung: Secondary | ICD-10-CM

## 2014-10-10 DIAGNOSIS — Z171 Estrogen receptor negative status [ER-]: Secondary | ICD-10-CM | POA: Diagnosis not present

## 2014-10-10 DIAGNOSIS — Z803 Family history of malignant neoplasm of breast: Secondary | ICD-10-CM

## 2014-10-10 DIAGNOSIS — F418 Other specified anxiety disorders: Secondary | ICD-10-CM

## 2014-10-10 DIAGNOSIS — Z808 Family history of malignant neoplasm of other organs or systems: Secondary | ICD-10-CM

## 2014-10-10 DIAGNOSIS — F419 Anxiety disorder, unspecified: Secondary | ICD-10-CM | POA: Diagnosis not present

## 2014-10-10 NOTE — Patient Instructions (Signed)
[facility] Discharge Instructions  RECOMMENDATIONS MADE BY THE CONSULTANT AND ANY TEST RESULTS WILL BE SENT TO YOUR REFERRING PHYSICIAN.  Exam and discussion by Dr. Whitney Muse. Will make a referral for Genetic Counseling at [facility]. Will have you meet Lupita Raider our Nurse Navigator  Follow-up here after surgery.  Thank you for choosing [facility] at [facility] to provide your oncology and hematology care.  To afford each patient quality time with our provider, please arrive at least 15 minutes before your scheduled appointment time.    You need to re-schedule your appointment should you arrive 10 or more minutes late.  We strive to give you quality time with our providers, and arriving late affects you and other patients whose appointments are after yours.  Also, if you no show three or more times for appointments you may be dismissed from the clinic at the providers discretion.     Again, thank you for choosing [facility].  Our hope is that these requests will decrease the amount of time that you wait before being seen by our physicians.       _____________________________________________________________  Should you have questions after your visit to [facility], please contact our office at [phone] between the hours of 8:30 a.m. and 4:30 p.m.  Voicemails left after 4:30 p.m. will not be returned until the following business day.  For prescription refill requests, have your pharmacy contact our office.

## 2014-10-10 NOTE — Progress Notes (Signed)
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 She has a Breast MRI this Thursday and a surgical appointment with Dr. Donne Hazel this Friday.  She has done research on her diagnosis and expresses that if necessary, she does not have a problem having whatever surgical procedure that is necessary including a mastectomy with reconstruction. She expresses her fears of "trusting the pathology" and emphasizes that her biggest concern is peace of mind moving forward. She "does not want to be in this position again."   At the age of about 63, the patient had radiation to several sites of "mole removal" on her face and neck secondary to keloid scar formation. This was at [facility] in [place], [place].  10 years after completion of radiation, she was told that because of her radiation history she needed routine TSH screening.  She now gets her TSH levels checked regularly by her primary.    In 1977, the patient was diagnosed with trigeminal neuralgia that was resolved with 6 weeks of Dilantin.  This still occurs periodically and she has experienced this 4 times throughout her life.  She also has a history of paresthesias in her left hand with CNS related issues.  She had a lumbar tap with negative results and was given a diagnosis Radiation Myelopathy.  She  has had a hysterectomy completed 10 years ago due to a benign mass being found.  She has had shingles twice.   She would like to finish her Hepatitis immunization series and questions if she should get her flu shot and Pneumovax.   The patient and her husband had planned a trip to [place] and has made the decision to no longer go due to any risks that may occur since she will be having surgery completed before.  Her husband sates that she tends to look too far ahead and think pessimistically.  She had multiple questions about therapy for her diagnosis. She understands that if final surgical pathology reveals DCIS only that there is no indication for chemotherapy but she states "she does not know if she feels comfortable with that option." She has questions regarding her biopsy last year and whether the "abnormal area" biopsied at that time will be removed during her surgery. She also has questions in regards to breast MRI.   MEDICAL HISTORY:  Past Medical History  Diagnosis Date  . GERD (gastroesophageal reflux disease)   . Anxiety   . Childhood asthma   . Radiation     as child for treatment of keloids on face and neck    SURGICAL HISTORY: Past Surgical History  Procedure Laterality Date  . Esophagogastroduodenoscopy  04/20/09    mild gastritis  . Esophagogastroduodenoscopy  07/27/09    small internal hemorrhoids/rare sigmoid colon diverticula other wise no polyps  . Bravo capsule endoscopy  05/02/09    GERD with adequate acid suppression  .  Abdominal hysterectomy  2006  . Esophagogastroduodenoscopy  11/21/2011    Procedure: ESOPHAGOGASTRODUODENOSCOPY (EGD);  Surgeon: Danie Binder, MD;  [facility];  Service: Endoscopy;  Laterality: N/A;  2:00  . Bravo ph study  11/21/2011    Procedure: BRAVO [place];  Surgeon: Danie Binder, MD;  [facility];  Service: Endoscopy;  Laterality: N/A;  . Breast surgery  AUGUST 2015    BREAST  BIOPSY   . Colonoscopy  11/21/2011  . Breast biopsy  10/05/14  . Back surgery  1995    fusion of L4 & L5    SOCIAL HISTORY: Social History   Social History  . Marital Status: Married    Spouse Name: N/A  . Number of Children: N/A  . Years of Education: N/A   Occupational History  . Not on file.   Social History Main Topics  . Smoking status: Never Smoker   . Smokeless tobacco: Never Used  . Alcohol Use: No  . Drug Use: No  . Sexual Activity: Not on file   Other Topics Concern  . Not on file   Social History Narrative  Married 40 years 3 children, all breast fed for 4-5 months.  First child at 81 Surgical menopause, 7, still with regular cycles.  She began her menstrual cycle at age 66, irregular, took birth control from ages [age] 3 grandchildren Non-smoker ETOH, none.  History of alcoholism in her family. Hobbies include Scientist, forensic, needlework, gardening of both flowers and vegetables  FAMILY HISTORY: Family History  Problem Relation Age of Onset  . Cancer Mother     ESOPHAGEAL   . Dementia Mother   . Cancer Father     LUNG- SMOKER  . Hypertension Father   . Cancer Brother     PROSTATE  . Cancer Other     esophageal   indicated that her mother is deceased. She indicated that her father is deceased. She indicated that her other is deceased.   ALLERGIES:  is allergic to clarithromycin; kapidex; metronidazole; and nsaids.  Father adopted, Deceased at 77, lung  He was a smoker Esophageal cancer 2 of her sisters 1 Brother, prostate cancer, 79, diagnosed at 68, cured with prostatectomy 1st cousin on maternal side who died from breast cancer at age 4.  Originally diagnosed in her late 40's.    MEDICATIONS:  Current Outpatient Prescriptions  Medication Sig Dispense Refill  . Calcium Citrate-Vitamin D (CITRACAL PETITES/VITAMIN D PO) Take 2 tablets by mouth daily.    . RABEprazole (ACIPHEX) 20 MG tablet TAKE ONE TABLET BY MOUTH 2 TIMES A DAY. 60 tablet  5  . venlafaxine (EFFEXOR) 37.5 MG tablet One tablet daily 30 tablet 11  . baclofen (LIORESAL) 10 MG tablet 1 PO 30 MINUTES PRIOR TO BREAKFAST AND LUNCH. (Patient not taking: Reported on 10/10/2014) 62 each 11  . ciprofloxacin (CIPRO) 500 MG tablet Take 1 tablet (500 mg total) by mouth 2 (two) times daily. Take 2 days until diarrhea resolves (Patient not taking: Reported on 10/10/2014) 4 tablet 0  . doxycycline (VIBRA-TABS) 100 MG tablet Take 1 tablet (100 mg total) by mouth daily. (Patient not taking: Reported on 10/10/2014) 37 tablet 0  . HYDROcodone-homatropine (HYCODAN) 5-1.5 MG/5ML syrup Take 5 mLs by mouth every 6 (six) hours as needed for cough. (Patient not taking: Reported on 10/10/2014) 120 mL 0  . NONFORMULARY OR COMPOUNDED ITEM Estradiol 0.02 % 50m prefilled applicator Sig: apply twice a week (Patient not taking: Reported  on 10/10/2014) 24 each 4  . typhoid (VIVOTIF) DR capsule Take 1 capsule by mouth every other day. Keep refrigerated (Patient not taking: Reported on 10/10/2014) 4 capsule 0   No current facility-administered medications for this visit.    Review of Systems  Constitutional: Negative.   HENT: Negative.   Eyes: Negative.   Respiratory: Negative.   Cardiovascular: Negative.   Gastrointestinal: Negative.   Genitourinary: Negative.   Musculoskeletal: Negative.   Skin: Negative.   Neurological: Negative.   Endo/Heme/Allergies: Negative.   Psychiatric/Behavioral: Negative for depression, suicidal ideas, hallucinations, memory loss and substance abuse. The patient is nervous/anxious. The patient does not have insomnia.   All other systems reviewed and are negative.  14 point ROS was done and is otherwise as detailed above or in HPI  PHYSICAL EXAMINATION: ECOG PERFORMANCE STATUS: 0 - Asymptomatic  Filed Vitals:   10/10/14 0820  BP: 159/86  Pulse: 91  Temp: 97.9 F (36.6 C)  Resp: 14   Filed Weights   10/10/14 0820  Weight: 158 lb 4.8 oz (71.804 kg)      Physical Exam  Constitutional: She is oriented to person, place, and time and well-developed, well-nourished, and in no distress.  Appropriate for age, pleasant  HENT:  Head: Normocephalic and atraumatic.  Nose: Nose normal.  Mouth/Throat: Oropharynx is clear and moist. No oropharyngeal exudate.  Eyes: Conjunctivae and EOM are normal. Pupils are equal, round, and reactive to light. Right eye exhibits no discharge. Left eye exhibits no discharge. No scleral icterus.  Neck: Normal range of motion. Neck supple. No tracheal deviation present. No thyromegaly present.  Cardiovascular: Normal rate, regular rhythm and normal heart sounds.  Exam reveals no gallop and no friction rub.   No murmur heard. Pulmonary/Chest: Effort normal and breath sounds normal. She has no wheezes. She has no rales.    Abdominal: Soft. Bowel sounds are normal. She exhibits no distension and no mass. There is no tenderness. There is no rebound and no guarding.  Musculoskeletal: Normal range of motion. She exhibits no edema.  Lymphadenopathy:    She has no cervical adenopathy.  Neurological: She is alert and oriented to person, place, and time. She has normal reflexes. No cranial nerve deficit. Gait normal. Coordination normal.  Skin: Skin is warm and dry. No rash noted.  Psychiatric: Mood, memory, affect and judgment normal.  Nursing note and vitals reviewed.   LABORATORY DATA:  I have reviewed the data as listed Lab Results  Component Value Date   WBC 8.9 12/07/2013   HGB 14.8 12/07/2013   HCT 43.7 12/07/2013   MCV 88.3 12/07/2013   PLT 262 12/07/2013   CMP     Component Value Date/Time   NA 141 04/20/2009 0529   K 4.0 04/20/2009 0529   CL 109 04/20/2009 0529   CO2 27 04/20/2009 0529   GLUCOSE 111* 04/20/2009 0529   BUN 13 04/20/2009 0529   CREATININE 0.73 04/20/2009 0529   CALCIUM 9.1 04/20/2009 0529   PROT 6.2 04/20/2009 0500   ALBUMIN 3.7 04/20/2009 0500   AST 40* 04/20/2009 0500   ALT  31 04/20/2009 0500   ALKPHOS 108 04/20/2009 0500   BILITOT 0.8 04/20/2009 0500   GFRNONAA >60 04/20/2009 0529   GFRAA  04/20/2009 0529    >60        The eGFR has been calculated using the MDRD equation. This calculation has not been validated in all clinical situations. eGFR's persistently <60 mL/min signify possible Chronic Kidney Disease.  RADIOGRAPHIC STUDIES: I have personally reviewed the radiological images as listed and agreed with the findings in the report.  CLINICAL DATA: Screening.  EXAM: DIGITAL SCREENING BILATERAL MAMMOGRAM WITH 3D TOMO WITH CAD  COMPARISON: Previous exam(s).  ACR Breast Density Category c: The breast tissue is heterogeneously dense, which may obscure small masses.  FINDINGS: In the left breast, calcifications warrant further evaluation. In the right breast, no findings suspicious for malignancy. Images were processed with CAD.  IMPRESSION: Further evaluation is suggested for calcifications in the left breast.  RECOMMENDATION: Diagnostic mammogram of the left breast. (Code:FI-L-35M)  The patient will be contacted regarding the findings, and additional imaging will be scheduled.  BI-RADS CATEGORY 0: Incomplete. Need additional imaging evaluation and/or prior mammograms for comparison.   Electronically Signed  By: Everlean Alstrom M.D.  On: 09/29/2014 08:03   ASSESSMENT & PLAN:  ER- PR- DCIS L breast Osteopenia DEXA 12/2013 FamHX prostate cancer in brother at age 32 Anxiety about diagnosis  I spent time today reviewing breast cancer in a general sense. We discussed DCIS and its prognosis in detail. I advised her that definitive surgical path is necessary and we discussed the possibility they may find invasion. This is a significant concern for her. I again emphasized to her that chemotherapy has a role in breast cancer but not for all patients and certainly not for those with DCIS. I advised her it will be  important for [place] to regroup after her surgery when we have definitive surgical pathology.  I discussed with her that the ideal surgical therapy will be determined at her meeting with Dr. Donne Hazel on Friday. We discussed general data in regards to lumpectomy and radiation versus mastectomy.  She has concerns about the potential for multifocal disease, or more extensive disease than that found on mammography or biopsy; I explained to her that breast MRI is very useful for determination of disease extent.  I have had her meet Hildred Alamin, our patient navigator today. I have emphasized to the patient to please use Hildred Alamin is a point of contact moving forward. When she has a definitive surgical date we will arrange for follow-up afterwards for ongoing discussion of her disease and final pathology. I tried to encourage her to stay positive. I advised her that I certainly understand the fears that she is currently facing. She was provided with several sources of reading information from the NCCN and our patient navigation material.   I have referred her to genetics per her request.  All questions were answered. The patient knows to call the clinic with any problems, questions or concerns.   This document serves as a record of services personally performed by Ancil Linsey, MD. It was created on her behalf by Janace Hoard, a trained medical scribe. The creation of this record is based on the scribe's personal observations and the provider's statements to them. This document has been checked and approved by the attending provider.  I have reviewed the above documentation for accuracy and completeness, and I agree with the above.  This note was electronically signed.  Kelby Fam. Whitney Muse, MD

## 2014-10-10 NOTE — Progress Notes (Signed)
Referral sent to genetics at [facility].  Request sent on 8/23

## 2014-10-12 ENCOUNTER — Ambulatory Visit
Admission: RE | Admit: 2014-10-12 | Discharge: 2014-10-12 | Disposition: A | Discharge status: Home / Self Care | Payer: BC Managed Care – PPO | Source: Ambulatory Visit | Attending: Internal Medicine | Admitting: Internal Medicine

## 2014-10-12 DIAGNOSIS — Z853 Personal history of malignant neoplasm of breast: Secondary | ICD-10-CM

## 2014-10-12 MED ORDER — GADOBENATE DIMEGLUMINE 529 MG/ML IV SOLN
14.0000 mL | Freq: Once | INTRAVENOUS | Status: AC | PRN
  Administered 2014-10-12: 14 mL via INTRAVENOUS

## 2014-10-16 ENCOUNTER — Ambulatory Visit (HOSPITAL_BASED_OUTPATIENT_CLINIC_OR_DEPARTMENT_OTHER): Payer: BC Managed Care – PPO | Admitting: Genetic Counselor

## 2014-10-16 ENCOUNTER — Encounter: Payer: Self-pay | Admitting: Genetic Counselor

## 2014-10-16 ENCOUNTER — Other Ambulatory Visit: Payer: BC Managed Care – PPO

## 2014-10-16 DIAGNOSIS — Z800 Family history of malignant neoplasm of digestive organs: Secondary | ICD-10-CM | POA: Diagnosis not present

## 2014-10-16 DIAGNOSIS — Z801 Family history of malignant neoplasm of trachea, bronchus and lung: Secondary | ICD-10-CM

## 2014-10-16 DIAGNOSIS — D0512 Intraductal carcinoma in situ of left breast: Secondary | ICD-10-CM

## 2014-10-16 DIAGNOSIS — Z808 Family history of malignant neoplasm of other organs or systems: Secondary | ICD-10-CM

## 2014-10-16 DIAGNOSIS — Z803 Family history of malignant neoplasm of breast: Secondary | ICD-10-CM

## 2014-10-16 DIAGNOSIS — Z315 Encounter for genetic counseling: Secondary | ICD-10-CM

## 2014-10-16 DIAGNOSIS — Z8052 Family history of malignant neoplasm of bladder: Secondary | ICD-10-CM

## 2014-10-16 DIAGNOSIS — C50912 Malignant neoplasm of unspecified site of left female breast: Secondary | ICD-10-CM

## 2014-10-16 NOTE — Progress Notes (Addendum)
REFERRING PROVIDER: [name], MD [address] [place], [place] [address]   Ancil Linsey, MD  PRIMARY PROVIDER:  [name], MD  PRIMARY REASON FOR VISIT:  1. Breast cancer, left   2. Family history of breast cancer      HISTORY OF PRESENT ILLNESS:   Ms. Gilvin, a 64 y.o. female, was seen for a [facility] cancer genetics consultation at the request of Dr. Whitney Muse due to a personal and family history of cancer.  Ms. Osmundson presents to clinic today to discuss the possibility of a hereditary predisposition to cancer, genetic testing, and to further clarify her future cancer risks, as well as potential cancer risks for family members.   In August 2016, at the age of 53, Ms. Brazee was diagnosed with DCIS of the left breast. The tumor is ER-/PR-. This may be treated with double mastectomy, based on the patient's decision on her current tumor and desire to not have a recurrent cancer.   CANCER HISTORY:   No history exists.     HORMONAL RISK FACTORS:  Menarche was at age 17.  First live birth at age 30.  OCP use for approximately 6 years.  Ovaries intact: no.  Hysterectomy: yes.  Menopausal status: postmenopausal.  HRT use: 9 years. Colonoscopy: yes; not examined. Mammogram within the last year: yes. Number of breast biopsies: 2. Up to date with pelvic exams:  yes. Any excessive radiation exposure in the past:  Yes.  Mole removal at 64 years of age, and had radiation treatment to neck and face for keloid scar formation.  Her radiation dosage was high enough to warrant getting thyroid testing annually.  Not shielded at the time.  Radiation treatment to right side.  Past Medical History  Diagnosis Date  . GERD (gastroesophageal reflux disease)   . Anxiety   . Childhood asthma   . Radiation     as child for treatment of keloids on face and neck  . 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  Esophagogastroduodenoscopy  04/20/09    mild gastritis  . Esophagogastroduodenoscopy  07/27/09    small internal hemorrhoids/rare sigmoid colon diverticula other wise no polyps  . Bravo capsule endoscopy  05/02/09    GERD with adequate acid suppression  . Abdominal hysterectomy  2006  . Esophagogastroduodenoscopy  11/21/2011    Procedure: ESOPHAGOGASTRODUODENOSCOPY (EGD);  Surgeon: Danie Binder, MD;  [facility];  Service: Endoscopy;  Laterality: N/A;  2:00  . Bravo ph study  11/21/2011    Procedure: BRAVO [place];  Surgeon: Danie Binder, MD;  [facility];  Service: Endoscopy;  Laterality: N/A;  . Breast surgery  AUGUST 2015    BREAST BIOPSY   . Colonoscopy  11/21/2011  . Breast biopsy  10/05/14  . Back surgery  1995    fusion of L4 & L5    Social History   Social History  . Marital Status: Married    Spouse Name: N/A  . Number of Children: 3  . Years of Education: N/A   Social History Main Topics  . Smoking status: Never Smoker   . Smokeless tobacco: Never Used  . Alcohol Use: No  . Drug Use: No  . Sexual Activity: Not on file   Other Topics Concern  . Not on file   Social History Narrative     FAMILY HISTORY:  We obtained a detailed, 4-generation family  history.  Significant diagnoses are listed below: Family History  Problem Relation Age of Onset  . Dementia Mother   . Esophageal cancer Mother 45  . Hypertension Father   . Lung cancer Father 76    smoker  . Prostate cancer Brother 79  . Dementia Maternal Aunt   . Bladder Cancer Maternal Uncle 32    former smoker  . Heart attack Maternal Grandmother   . Dementia Maternal Grandfather   . Esophageal cancer Maternal Aunt 72    non-smoker  . Brain cancer Maternal Uncle 63    Meningioma - benign  . Esophageal cancer Maternal Uncle 61   The patient has three children ranging in age from 82-35.  She has a brother who had prostate cancer at age 38.  Her father was adopted, and died of lung  cancer from smoking.  Her mother had esophageal cancer at 18 and died at 31.  Her mother had three brothers and two sisters.  One brother was a smoker and had bladder cancer at 83, one brother had a benign brain tumor at 76, and the third brother had esophageal cancer at 35. One sister had esophageal cancer at 74 and the other sister had dementia.  The patient's maternal grandmother had dementia and died at 73, and her grandfather had a heart attack at 42.  Patient's maternal ancestors are of [place] descent, and paternal ancestors are of Caucasian descent. There is no reported Ashkenazi Jewish ancestry. There is no known consanguinity.  GENETIC COUNSELING ASSESSMENT: BABS DABBS is a 64 y.o. female with a personal and family history of breast cancer and other cancers which somewhat suggestive of a hereditary cancer syndrome and predisposition to cancer. We, therefore, discussed and recommended the following at today's visit.   DISCUSSION: We discussed BRCA mutations in light of her family history and personal history of breast and prostate cancer.  In light of her ER-/PR- cancer, and the family history of ER-/PR- cancer we discussed PALB2 mutations as well.  We reviewed the characteristics, features and inheritance patterns of hereditary cancer syndromes. We also discussed genetic testing, including the appropriate family members to test, the process of testing, insurance coverage and turn-around-time for results. We discussed the implications of a negative, positive and/or variant of uncertain significant result. We recommended Ms. Jerrell pursue genetic testing for the Breast/Ovarian cancer gene panel. The Breast/Ovarian gene panel offered by [facility] includes sequencing and rearrangement analysis for the following 20 genes:  ATM, BARD1, BRCA1, BRCA2, BRIP1, CDH1, CHEK2, EPCAM, FANCC, MLH1, MSH2, MSH6, NBN, PALB2, PMS2, PTEN, RAD51C, RAD51D, TP53, and XRCC2.     Based on Ms. Zale's  personal and family history of cancer, she meets medical criteria for genetic testing. Despite that she meets criteria, she may still have an out of pocket cost. We discussed that if her out of pocket cost for testing is over $100, the laboratory will call and confirm whether she wants to proceed with testing.  If the out of pocket cost of testing is less than $100 she will be billed by the genetic testing laboratory.   PLAN: After considering the risks, benefits, and limitations, Ms. Mathwig  provided informed consent to pursue genetic testing and the blood sample was sent to [facility] for analysis of the Breast/Ovarian cancer panel. Results should be available within approximately 2-3 weeks' time, at which point they will be disclosed by telephone to Ms. Lippard, as will any additional recommendations warranted by these results. Ms. Dethlefs  will receive a summary of her genetic counseling visit and a copy of her results once available. This information will also be available in Epic. We encouraged Ms. Blinder to remain in contact with cancer genetics annually so that we can continuously update the family history and inform her of any changes in cancer genetics and testing that may be of benefit for her family. Ms. Padia questions were answered to her satisfaction today. Our contact information was provided should additional questions or concerns arise.  Lastly, we encouraged Ms. Losey to remain in contact with cancer genetics annually so that we can continuously update the family history and inform her of any changes in cancer genetics and testing that may be of benefit for this family.   Ms.  Solivan questions were answered to her satisfaction today. Our contact information was provided should additional questions or concerns arise. Thank you for the referral and allowing [place] to share in the care of your patient.   Antero Derosia P. Florene Glen, [place], [facility] Certified Genetic  Counselor [email or web address] phone: [phone]  The patient was seen for a total of 60 minutes in face-to-face genetic counseling.  This patient was discussed with Drs. Magrinat, Lindi Adie and/or Burr Medico who agrees with the above.    _______________________________________________________________________ For Office Staff:  Number of people involved in session: 2 Was an Intern/ student involved with case: no

## 2014-10-24 ENCOUNTER — Other Ambulatory Visit: Payer: Self-pay | Admitting: General Surgery

## 2014-10-24 DIAGNOSIS — D0512 Intraductal carcinoma in situ of left breast: Secondary | ICD-10-CM

## 2014-10-25 ENCOUNTER — Encounter (HOSPITAL_BASED_OUTPATIENT_CLINIC_OR_DEPARTMENT_OTHER): Payer: Self-pay | Admitting: *Deleted

## 2014-10-25 NOTE — H&P (Signed)
  Subjective:    Patient ID: Angie Jennings is a 64 y.o. female.  HPI  Patient of Drs. Donne Hazel and [place] here for consultation for breast reconstruction. Presented following diagnostic left MMG with suspicious left breast calcifications, 2.7 cm span. Biopsy with high-grade DCIS with calcification, ER -/, PR -. History of abnormal screening MMG of the left breast on 09/14/2013 and subsequent biopsy showing benign breast tissue, negative for atypia or malignancy, calcifications identified.  MRI Left breast with NME involving the lower outer quadrant, posterior and medial to the biopsy site, and there is no mammographic correlate to this enhancement, spanning 4.3 x 3.0 x 2.0 cm. Patient is firm in desire for bilateral mastectomy and no further biopsy to determine extent of disease planned. She also presents stating she does not want any flap based surgery as does not want to be cut anywhere else.  Genetics panel pending.  Current34/36 F, desires "C" cup. Wt stable. Notes does not take any NSAIDs as she was on prescription NSAIDs in past and developed elevated Cr- resolved off medication.  Husband OB/Gyn in [place], patient herself worked in pathology in past.  Review of Systems  Constitutional: Positive for fatigue.  Allergic/Immunologic: Positive for environmental allergies.  Psychiatric/Behavioral: Positive for sleep disturbance. The patient is nervous/anxious.   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 Her radiation dosage was high enough to warrant getting thyroid testing annually. Not shielded at the time.    Objective:   Physical Exam  Constitutional: She is oriented to person, place, and time.  Cardiovascular: Normal rate.  Pulmonary/Chest: Effort normal.  Genitourinary: No breast discharge.  Induration left breast in area biopsy, L>R volume grade 2 ptosis bilat  SN to nipple R 26  L 28 cm BW R 17 L 17 cm Nipple to IMF R 10 L 10 cm  Left axillary tail- patient has marked area that her husband has counseled is LN  Neurological: She is alert and oriented to person, place, and time.  Skin:  Fitzpatrick 2       Assessment:     Left breast DCIS Genetics pending     Plan:     Reviewed in absence of genetic predisposition no benefit of prophylactic mastectomy. Patient understands and desires bilateral, feels she will have to undergo surgery on this breast for symmetry anyway and prefer mastectomy.  Reviewed immediate vs delayed, autologous vs implant based. Although patient declines discussion of autologous, counseled compared with implant based, flap reconstruction does not rupture, does not require removal for infection. Reviewed staged nature of reconstruction, process of expansion, drains, post op limitations, compression. Reviewed timing of second surgery as early as 3 months post mastectomy though will depend on final pathology. Counseled she would be candidate for NSM if Dr. Donne Hazel agrees. Discussed IMF scar, nipple will be asensate, will not stimulate. Reviewed risks mastectomy flap necrosis, infection, need for additional surgery. Will need assistance at home for few weeks and anticipate would not be able to drive for 2-3 weeks. Reviewed examples of implants, expander and portfolio pictures.      Irene Limbo, MD [facility] Plastic & Reconstructive Surgery [phone]

## 2014-10-27 ENCOUNTER — Other Ambulatory Visit: Payer: Self-pay | Admitting: General Surgery

## 2014-10-27 ENCOUNTER — Encounter (HOSPITAL_BASED_OUTPATIENT_CLINIC_OR_DEPARTMENT_OTHER)
Admission: RE | Admit: 2014-10-27 | Discharge: 2014-10-27 | Disposition: A | Discharge status: Home / Self Care | Payer: BC Managed Care – PPO | Source: Ambulatory Visit | Attending: General Surgery | Admitting: General Surgery

## 2014-10-27 DIAGNOSIS — K219 Gastro-esophageal reflux disease without esophagitis: Secondary | ICD-10-CM | POA: Diagnosis not present

## 2014-10-27 DIAGNOSIS — Z4001 Encounter for prophylactic removal of breast: Secondary | ICD-10-CM | POA: Diagnosis not present

## 2014-10-27 DIAGNOSIS — D0512 Intraductal carcinoma in situ of left breast: Secondary | ICD-10-CM

## 2014-10-27 LAB — CBC WITH DIFFERENTIAL/PLATELET
BASOS ABS: 0 10*3/uL (ref 0.0–0.1)
BASOS PCT: 0 % (ref 0–1)
EOS ABS: 0.2 10*3/uL (ref 0.0–0.7)
EOS PCT: 3 % (ref 0–5)
HCT: 44.3 % (ref 36.0–46.0)
Hemoglobin: 14.8 g/dL (ref 12.0–15.0)
LYMPHS ABS: 2.3 10*3/uL (ref 0.7–4.0)
Lymphocytes Relative: 33 % (ref 12–46)
MCH: 29.8 pg (ref 26.0–34.0)
MCHC: 33.4 g/dL (ref 30.0–36.0)
MCV: 89.3 fL (ref 78.0–100.0)
Monocytes Absolute: 0.8 10*3/uL (ref 0.1–1.0)
Monocytes Relative: 11 % (ref 3–12)
NEUTROS PCT: 53 % (ref 43–77)
Neutro Abs: 3.7 10*3/uL (ref 1.7–7.7)
PLATELETS: 215 10*3/uL (ref 150–400)
RBC: 4.96 MIL/uL (ref 3.87–5.11)
RDW: 13.2 % (ref 11.5–15.5)
WBC: 6.9 10*3/uL (ref 4.0–10.5)

## 2014-10-27 LAB — BASIC METABOLIC PANEL
Anion gap: 7 (ref 5–15)
BUN: 12 mg/dL (ref 6–20)
CALCIUM: 9.2 mg/dL (ref 8.9–10.3)
CO2: 28 mmol/L (ref 22–32)
CREATININE: 0.81 mg/dL (ref 0.44–1.00)
Chloride: 102 mmol/L (ref 101–111)
Glucose, Bld: 99 mg/dL (ref 65–99)
Potassium: 4.2 mmol/L (ref 3.5–5.1)
SODIUM: 137 mmol/L (ref 135–145)

## 2014-10-31 ENCOUNTER — Encounter: Payer: Self-pay | Admitting: Genetic Counselor

## 2014-10-31 ENCOUNTER — Telehealth: Payer: Self-pay | Admitting: Genetic Counselor

## 2014-10-31 ENCOUNTER — Other Ambulatory Visit: Payer: Self-pay | Admitting: Gastroenterology

## 2014-10-31 DIAGNOSIS — Z1379 Encounter for other screening for genetic and chromosomal anomalies: Secondary | ICD-10-CM | POA: Insufficient documentation

## 2014-10-31 NOTE — Telephone Encounter (Signed)
LM on VM with good news.  Gave CB instructions. 

## 2014-11-01 ENCOUNTER — Ambulatory Visit (HOSPITAL_BASED_OUTPATIENT_CLINIC_OR_DEPARTMENT_OTHER): Payer: BC Managed Care – PPO | Admitting: Anesthesiology

## 2014-11-01 ENCOUNTER — Ambulatory Visit (HOSPITAL_BASED_OUTPATIENT_CLINIC_OR_DEPARTMENT_OTHER)
Admission: RE | Admit: 2014-11-01 | Discharge: 2014-11-02 | Disposition: A | Discharge status: Home / Self Care | Payer: BC Managed Care – PPO | Source: Ambulatory Visit | Attending: General Surgery | Admitting: General Surgery

## 2014-11-01 ENCOUNTER — Encounter (HOSPITAL_BASED_OUTPATIENT_CLINIC_OR_DEPARTMENT_OTHER)
Admission: RE | Disposition: A | Discharge status: Home / Self Care | Payer: Self-pay | Source: Ambulatory Visit | Attending: General Surgery

## 2014-11-01 ENCOUNTER — Encounter (HOSPITAL_BASED_OUTPATIENT_CLINIC_OR_DEPARTMENT_OTHER): Payer: Self-pay | Admitting: Anesthesiology

## 2014-11-01 ENCOUNTER — Ambulatory Visit (HOSPITAL_COMMUNITY)
Admission: RE | Admit: 2014-11-01 | Discharge: 2014-11-01 | Disposition: A | Discharge status: Home / Self Care | Payer: BC Managed Care – PPO | Source: Ambulatory Visit | Attending: General Surgery | Admitting: General Surgery

## 2014-11-01 DIAGNOSIS — D0510 Intraductal carcinoma in situ of unspecified breast: Secondary | ICD-10-CM | POA: Diagnosis present

## 2014-11-01 DIAGNOSIS — Z4001 Encounter for prophylactic removal of breast: Secondary | ICD-10-CM | POA: Insufficient documentation

## 2014-11-01 DIAGNOSIS — D0512 Intraductal carcinoma in situ of left breast: Secondary | ICD-10-CM

## 2014-11-01 DIAGNOSIS — K219 Gastro-esophageal reflux disease without esophagitis: Secondary | ICD-10-CM | POA: Insufficient documentation

## 2014-11-01 HISTORY — PX: BREAST RECONSTRUCTION WITH PLACEMENT OF TISSUE EXPANDER AND FLEX HD (ACELLULAR HYDRATED DERMIS): SHX6295

## 2014-11-01 HISTORY — PX: NIPPLE SPARING MASTECTOMY/SENTINAL LYMPH NODE BIOPSY/RECONSTRUCTION/PLACEMENT OF TISSUE EXPANDER: SHX6484

## 2014-11-01 SURGERY — NIPPLE SPARING MASTECTOMY WITH SENTINAL LYMPH NODE BIOPSY AND  RECONSTRUCTION WITH PLACEMENT OF TISSUE EXPANDER
Anesthesia: General | Site: Breast | Laterality: Bilateral

## 2014-11-01 MED ORDER — PROPOFOL 10 MG/ML IV BOLUS
INTRAVENOUS | Status: DC | PRN
  Administered 2014-11-01: 150 mg via INTRAVENOUS

## 2014-11-01 MED ORDER — ONDANSETRON 4 MG PO TBDP: 4.0000 mg | ORAL_TABLET | Freq: Four times a day (QID) | ORAL | Status: DC | PRN

## 2014-11-01 MED ORDER — OXYCODONE HCL 5 MG PO TABS
5.0000 mg | ORAL_TABLET | ORAL | Status: DC | PRN
  Administered 2014-11-01 – 2014-11-02 (×4): 10 mg via ORAL
  Filled 2014-11-01 (×4): qty 2

## 2014-11-01 MED ORDER — GLYCOPYRROLATE 0.2 MG/ML IJ SOLN
INTRAMUSCULAR | Status: AC
  Filled 2014-11-01: qty 2

## 2014-11-01 MED ORDER — MIDAZOLAM HCL 2 MG/2ML IJ SOLN
INTRAMUSCULAR | Status: AC
  Filled 2014-11-01: qty 4

## 2014-11-01 MED ORDER — DEXAMETHASONE SODIUM PHOSPHATE 4 MG/ML IJ SOLN
INTRAMUSCULAR | Status: DC | PRN
  Administered 2014-11-01: 10 mg via INTRAVENOUS

## 2014-11-01 MED ORDER — MIDAZOLAM HCL 2 MG/2ML IJ SOLN
1.0000 mg | INTRAMUSCULAR | Status: DC | PRN
  Administered 2014-11-01 (×2): 2 mg via INTRAVENOUS

## 2014-11-01 MED ORDER — ONDANSETRON HCL 4 MG/2ML IJ SOLN
INTRAMUSCULAR | Status: AC
  Filled 2014-11-01: qty 2

## 2014-11-01 MED ORDER — ONDANSETRON HCL 4 MG/2ML IJ SOLN: 4.0000 mg | Freq: Four times a day (QID) | INTRAMUSCULAR | Status: DC | PRN

## 2014-11-01 MED ORDER — ZOLPIDEM TARTRATE 5 MG PO TABS: 5.0000 mg | ORAL_TABLET | Freq: Every evening | ORAL | Status: DC | PRN

## 2014-11-01 MED ORDER — METHYLENE BLUE 1 % INJ SOLN
INTRAMUSCULAR | Status: AC
  Filled 2014-11-01: qty 10

## 2014-11-01 MED ORDER — METHOCARBAMOL 500 MG PO TABS
500.0000 mg | ORAL_TABLET | Freq: Four times a day (QID) | ORAL | Status: DC | PRN
  Administered 2014-11-01 – 2014-11-02 (×3): 500 mg via ORAL
  Filled 2014-11-01 (×3): qty 1

## 2014-11-01 MED ORDER — ACETAMINOPHEN 10 MG/ML IV SOLN
INTRAVENOUS | Status: DC | PRN
  Administered 2014-11-01: 1000 mg via INTRAVENOUS

## 2014-11-01 MED ORDER — ROCURONIUM BROMIDE 100 MG/10ML IV SOLN
INTRAVENOUS | Status: DC | PRN
  Administered 2014-11-01: 20 mg via INTRAVENOUS
  Administered 2014-11-01: 30 mg via INTRAVENOUS

## 2014-11-01 MED ORDER — ACETAMINOPHEN 650 MG RE SUPP: 650.0000 mg | Freq: Four times a day (QID) | RECTAL | Status: DC | PRN

## 2014-11-01 MED ORDER — CEFAZOLIN SODIUM-DEXTROSE 2-3 GM-% IV SOLR
INTRAVENOUS | Status: AC
  Filled 2014-11-01: qty 50

## 2014-11-01 MED ORDER — SCOPOLAMINE 1 MG/3DAYS TD PT72: 1.0000 | MEDICATED_PATCH | Freq: Once | TRANSDERMAL | Status: DC | PRN

## 2014-11-01 MED ORDER — FENTANYL CITRATE (PF) 100 MCG/2ML IJ SOLN
50.0000 ug | INTRAMUSCULAR | Status: DC | PRN
  Administered 2014-11-01: 100 ug via INTRAVENOUS

## 2014-11-01 MED ORDER — SUCCINYLCHOLINE CHLORIDE 20 MG/ML IJ SOLN
INTRAMUSCULAR | Status: AC
  Filled 2014-11-01: qty 1

## 2014-11-01 MED ORDER — MORPHINE SULFATE (PF) 2 MG/ML IV SOLN: 2.0000 mg | INTRAVENOUS | Status: DC | PRN

## 2014-11-01 MED ORDER — SODIUM CHLORIDE 0.9 % IJ SOLN
INTRAMUSCULAR | Status: AC
  Filled 2014-11-01: qty 10

## 2014-11-01 MED ORDER — HYDROMORPHONE HCL 1 MG/ML IJ SOLN
INTRAMUSCULAR | Status: AC
Start: 2014-11-01 — End: 2014-11-01
  Filled 2014-11-01: qty 1

## 2014-11-01 MED ORDER — ACETAMINOPHEN 10 MG/ML IV SOLN
INTRAVENOUS | Status: AC
  Filled 2014-11-01: qty 100

## 2014-11-01 MED ORDER — LIDOCAINE HCL (CARDIAC) 20 MG/ML IV SOLN
INTRAVENOUS | Status: DC | PRN
  Administered 2014-11-01: 50 mg via INTRAVENOUS

## 2014-11-01 MED ORDER — BUPIVACAINE-EPINEPHRINE (PF) 0.25% -1:200000 IJ SOLN
INTRAMUSCULAR | Status: AC
  Filled 2014-11-01: qty 30

## 2014-11-01 MED ORDER — CEFAZOLIN SODIUM-DEXTROSE 2-3 GM-% IV SOLR
2.0000 g | Freq: Three times a day (TID) | INTRAVENOUS | Status: DC
Start: 2014-11-01 — End: 2014-11-02
  Administered 2014-11-02: 2 g via INTRAVENOUS
  Filled 2014-11-01 (×3): qty 50

## 2014-11-01 MED ORDER — GLYCOPYRROLATE 0.2 MG/ML IJ SOLN
0.2000 mg | Freq: Once | INTRAMUSCULAR | Status: AC | PRN
  Administered 2014-11-01: 0.4 mg via INTRAVENOUS

## 2014-11-01 MED ORDER — PANTOPRAZOLE SODIUM 40 MG PO TBEC: 40.0000 mg | DELAYED_RELEASE_TABLET | Freq: Every day | ORAL | Status: DC

## 2014-11-01 MED ORDER — KCL IN DEXTROSE-NACL 20-5-0.45 MEQ/L-%-% IV SOLN
INTRAVENOUS | Status: DC
  Administered 2014-11-01: 16:00:00 via INTRAVENOUS
  Filled 2014-11-01: qty 1000

## 2014-11-01 MED ORDER — HYDROMORPHONE HCL 1 MG/ML IJ SOLN
0.2500 mg | INTRAMUSCULAR | Status: DC | PRN
  Administered 2014-11-01 (×2): 0.5 mg via INTRAVENOUS

## 2014-11-01 MED ORDER — TECHNETIUM TC 99M SULFUR COLLOID FILTERED
1.0000 | Freq: Once | INTRAVENOUS | Status: AC | PRN
  Administered 2014-11-01: 1 via INTRADERMAL

## 2014-11-01 MED ORDER — FENTANYL CITRATE (PF) 100 MCG/2ML IJ SOLN
INTRAMUSCULAR | Status: AC
  Filled 2014-11-01: qty 2

## 2014-11-01 MED ORDER — SUFENTANIL CITRATE 50 MCG/ML IV SOLN
INTRAVENOUS | Status: AC
  Filled 2014-11-01: qty 1

## 2014-11-01 MED ORDER — SUFENTANIL CITRATE 50 MCG/ML IV SOLN
INTRAVENOUS | Status: DC | PRN
  Administered 2014-11-01 (×4): 5 ug via INTRAVENOUS

## 2014-11-01 MED ORDER — CEFAZOLIN SODIUM-DEXTROSE 2-3 GM-% IV SOLR
2.0000 g | INTRAVENOUS | Status: AC
  Administered 2014-11-01: 2 g via INTRAVENOUS

## 2014-11-01 MED ORDER — SIMETHICONE 80 MG PO CHEW: 40.0000 mg | CHEWABLE_TABLET | Freq: Four times a day (QID) | ORAL | Status: DC | PRN

## 2014-11-01 MED ORDER — PROMETHAZINE HCL 25 MG/ML IJ SOLN
6.2500 mg | INTRAMUSCULAR | Status: DC | PRN
Start: 2014-11-01 — End: 2014-11-02

## 2014-11-01 MED ORDER — LACTATED RINGERS IV SOLN
INTRAVENOUS | Status: DC
  Administered 2014-11-01 (×2): via INTRAVENOUS

## 2014-11-01 MED ORDER — NEOSTIGMINE METHYLSULFATE 10 MG/10ML IV SOLN
INTRAVENOUS | Status: AC
  Filled 2014-11-01: qty 1

## 2014-11-01 MED ORDER — ACETAMINOPHEN 325 MG PO TABS
650.0000 mg | ORAL_TABLET | Freq: Four times a day (QID) | ORAL | Status: DC | PRN
  Administered 2014-11-02: 650 mg via ORAL
  Filled 2014-11-01: qty 2

## 2014-11-01 MED ORDER — LIDOCAINE-EPINEPHRINE (PF) 1.5 %-1:200000 IJ SOLN
INTRAMUSCULAR | Status: DC | PRN
  Administered 2014-11-01: 10 mL via PERINEURAL

## 2014-11-01 MED ORDER — GENTAMICIN SULFATE 40 MG/ML IJ SOLN
INTRAMUSCULAR | Status: DC | PRN
  Administered 2014-11-01: 1000 mL

## 2014-11-01 MED ORDER — MIDAZOLAM HCL 2 MG/2ML IJ SOLN
INTRAMUSCULAR | Status: AC
  Filled 2014-11-01: qty 2

## 2014-11-01 MED ORDER — ONDANSETRON HCL 4 MG/2ML IJ SOLN
INTRAMUSCULAR | Status: DC | PRN
  Administered 2014-11-01: 4 mg via INTRAVENOUS

## 2014-11-01 MED ORDER — BUPIVACAINE HCL (PF) 0.5 % IJ SOLN
INTRAMUSCULAR | Status: DC | PRN
  Administered 2014-11-01: 20 mL via PERINEURAL

## 2014-11-01 MED ORDER — SUCCINYLCHOLINE CHLORIDE 20 MG/ML IJ SOLN
INTRAMUSCULAR | Status: DC | PRN
  Administered 2014-11-01: 120 mg via INTRAVENOUS

## 2014-11-01 MED ORDER — LIDOCAINE HCL (CARDIAC) 20 MG/ML IV SOLN
INTRAVENOUS | Status: AC
  Filled 2014-11-01: qty 5

## 2014-11-01 MED ORDER — NEOSTIGMINE METHYLSULFATE 10 MG/10ML IV SOLN
INTRAVENOUS | Status: DC | PRN
  Administered 2014-11-01: 3 mg via INTRAVENOUS

## 2014-11-01 SURGICAL SUPPLY — 107 items
APL SKNCLS STERI-STRIP NONHPOA (GAUZE/BANDAGES/DRESSINGS)
APPLIER CLIP 11 MED OPEN (CLIP) ×4
APPLIER CLIP 9.375 MED OPEN (MISCELLANEOUS) ×4
APR CLP MED 11 20 MLT OPN (CLIP) ×2
APR CLP MED 9.3 20 MLT OPN (MISCELLANEOUS) ×2
BAG DECANTER FOR FLEXI CONT (MISCELLANEOUS) ×6 IMPLANT
BENZOIN TINCTURE PRP APPL 2/3 (GAUZE/BANDAGES/DRESSINGS) IMPLANT
BINDER BREAST LRG (GAUZE/BANDAGES/DRESSINGS) ×2 IMPLANT
BINDER BREAST MEDIUM (GAUZE/BANDAGES/DRESSINGS) IMPLANT
BINDER BREAST XLRG (GAUZE/BANDAGES/DRESSINGS) IMPLANT
BINDER BREAST XXLRG (GAUZE/BANDAGES/DRESSINGS) IMPLANT
BIOPATCH RED 1 DISK 7.0 (GAUZE/BANDAGES/DRESSINGS) ×3 IMPLANT
BIOPATCH RED 1IN DISK 7.0MM (GAUZE/BANDAGES/DRESSINGS) ×1
BLADE CLIPPER SURG (BLADE) IMPLANT
BLADE HEX COATED 2.75 (ELECTRODE) ×4 IMPLANT
BLADE SURG 10 STRL SS (BLADE) ×8 IMPLANT
BLADE SURG 15 STRL LF DISP TIS (BLADE) ×4 IMPLANT
BLADE SURG 15 STRL SS (BLADE) ×4
BNDG GAUZE ELAST 4 BULKY (GAUZE/BANDAGES/DRESSINGS) ×8 IMPLANT
CANISTER SUCT 1200ML W/VALVE (MISCELLANEOUS) ×6 IMPLANT
CHLORAPREP W/TINT 26ML (MISCELLANEOUS) ×6 IMPLANT
CLIP APPLIE 11 MED OPEN (CLIP) ×2 IMPLANT
CLIP APPLIE 9.375 MED OPEN (MISCELLANEOUS) IMPLANT
CLOSURE WOUND 1/2 X4 (GAUZE/BANDAGES/DRESSINGS)
COVER BACK TABLE 60X90IN (DRAPES) ×8 IMPLANT
COVER MAYO STAND STRL (DRAPES) ×6 IMPLANT
COVER PROBE W GEL 5X96 (DRAPES) ×4 IMPLANT
DECANTER SPIKE VIAL GLASS SM (MISCELLANEOUS) IMPLANT
DEVICE DISSECT PLASMABLAD 3.0S (MISCELLANEOUS) IMPLANT
DRAIN CHANNEL 15F RND FF W/TCR (WOUND CARE) ×2 IMPLANT
DRAIN CHANNEL 19F RND (DRAIN) ×4 IMPLANT
DRAPE LAPAROSCOPIC ABDOMINAL (DRAPES) ×6 IMPLANT
DRAPE UNIVERSAL PACK (DRAPES) ×2 IMPLANT
DRAPE UTILITY XL STRL (DRAPES) ×4 IMPLANT
DRSG PAD ABDOMINAL 8X10 ST (GAUZE/BANDAGES/DRESSINGS) ×12 IMPLANT
DRSG TEGADERM 4X10 (GAUZE/BANDAGES/DRESSINGS) ×2 IMPLANT
DRSG TEGADERM 4X4.75 (GAUZE/BANDAGES/DRESSINGS) ×12 IMPLANT
ELECT BLADE 4.0 EZ CLEAN MEGAD (MISCELLANEOUS) ×4
ELECT BLADE 6.5 .24CM SHAFT (ELECTRODE) IMPLANT
ELECT COATED BLADE 2.86 ST (ELECTRODE) ×2 IMPLANT
ELECT REM PT RETURN 9FT ADLT (ELECTROSURGICAL) ×8
ELECTRODE BLDE 4.0 EZ CLN MEGD (MISCELLANEOUS) ×2 IMPLANT
ELECTRODE REM PT RTRN 9FT ADLT (ELECTROSURGICAL) ×4 IMPLANT
EVACUATOR SILICONE 100CC (DRAIN) ×8 IMPLANT
GAUZE SPONGE 4X4 12PLY STRL (GAUZE/BANDAGES/DRESSINGS) ×4 IMPLANT
GLOVE BIO SURGEON STRL SZ 6 (GLOVE) ×8 IMPLANT
GLOVE BIO SURGEON STRL SZ 6.5 (GLOVE) ×3 IMPLANT
GLOVE BIO SURGEON STRL SZ7 (GLOVE) ×4 IMPLANT
GLOVE BIO SURGEONS STRL SZ 6.5 (GLOVE) ×1
GLOVE BIOGEL M STRL SZ7.5 (GLOVE) ×2 IMPLANT
GLOVE BIOGEL PI IND STRL 7.0 (GLOVE) IMPLANT
GLOVE BIOGEL PI IND STRL 7.5 (GLOVE) ×2 IMPLANT
GLOVE BIOGEL PI INDICATOR 7.0 (GLOVE) ×4
GLOVE BIOGEL PI INDICATOR 7.5 (GLOVE) ×4
GLOVE ECLIPSE 6.5 STRL STRAW (GLOVE) ×8 IMPLANT
GOWN STRL REUS W/ TWL LRG LVL3 (GOWN DISPOSABLE) ×10 IMPLANT
GOWN STRL REUS W/TWL LRG LVL3 (GOWN DISPOSABLE) ×24
GRAFT FLEX HD BILAT 4X16 THICK (Tissue Mesh) ×4 IMPLANT
ILLUMINATOR WAVEGUIDE N/F (MISCELLANEOUS) IMPLANT
IMPL BREAST ARTOURA 375CC (Breast) IMPLANT
IMPLANT BREAST ARTOURA 375CC (Breast) ×8 IMPLANT
IV NS 500ML (IV SOLUTION)
IV NS 500ML BAXH (IV SOLUTION) ×4 IMPLANT
KIT FILL SYSTEM UNIVERSAL (SET/KITS/TRAYS/PACK) ×6 IMPLANT
KIT MARKER MARGIN INK (KITS) IMPLANT
LIGHT WAVEGUIDE WIDE FLAT (MISCELLANEOUS) ×2 IMPLANT
LIQUID BAND (GAUZE/BANDAGES/DRESSINGS) ×10 IMPLANT
MARKER SKIN DUAL TIP RULER LAB (MISCELLANEOUS) ×4 IMPLANT
NDL HYPO 25X1 1.5 SAFETY (NEEDLE) IMPLANT
NDL SAFETY ECLIPSE 18X1.5 (NEEDLE) IMPLANT
NEEDLE HYPO 18GX1.5 SHARP (NEEDLE)
NEEDLE HYPO 25X1 1.5 SAFETY (NEEDLE) IMPLANT
NS IRRIG 1000ML POUR BTL (IV SOLUTION) ×4 IMPLANT
PACK BASIN DAY SURGERY FS (CUSTOM PROCEDURE TRAY) ×8 IMPLANT
PENCIL BUTTON HOLSTER BLD 10FT (ELECTRODE) ×8 IMPLANT
PIN SAFETY STERILE (MISCELLANEOUS) ×4 IMPLANT
PLASMABLADE 3.0S (MISCELLANEOUS) ×4
SET ASEPTIC TRANSFER (MISCELLANEOUS) ×2 IMPLANT
SHEET MEDIUM DRAPE 40X70 STRL (DRAPES) ×4 IMPLANT
SLEEVE SCD COMPRESS KNEE MED (MISCELLANEOUS) ×6 IMPLANT
SLEEVE SURGEON STRL (DRAPES) ×2 IMPLANT
SPONGE LAP 18X18 X RAY DECT (DISPOSABLE) ×18 IMPLANT
STAPLER VISISTAT 35W (STAPLE) ×6 IMPLANT
STRIP CLOSURE SKIN 1/2X4 (GAUZE/BANDAGES/DRESSINGS) IMPLANT
SUT ETHIBOND 2-0 V-5 NDL (SUTURE) IMPLANT
SUT ETHIBOND 2-0 V-5 NEEDLE (SUTURE) IMPLANT
SUT ETHILON 2 0 FS 18 (SUTURE) ×4 IMPLANT
SUT ETHILON 3 0 PS 1 (SUTURE) IMPLANT
SUT MNCRL AB 3-0 PS2 18 (SUTURE) ×14 IMPLANT
SUT MNCRL AB 4-0 PS2 18 (SUTURE) ×8 IMPLANT
SUT MON AB 5-0 PS2 18 (SUTURE) IMPLANT
SUT PDS AB 2-0 CT2 27 (SUTURE) IMPLANT
SUT SILK 2 0 SH (SUTURE) ×4 IMPLANT
SUT SILK 3 0 PS 1 (SUTURE) IMPLANT
SUT VIC AB 3-0 SH 27 (SUTURE) ×4
SUT VIC AB 3-0 SH 27X BRD (SUTURE) ×2 IMPLANT
SUT VICRYL 4-0 PS2 18IN ABS (SUTURE) ×6 IMPLANT
SYR 50ML LL SCALE MARK (SYRINGE) IMPLANT
SYR BULB IRRIGATION 50ML (SYRINGE) ×8 IMPLANT
SYR CONTROL 10ML LL (SYRINGE) IMPLANT
TOWEL OR 17X24 6PK STRL BLUE (TOWEL DISPOSABLE) ×16 IMPLANT
TRAY DSU PREP LF (CUSTOM PROCEDURE TRAY) IMPLANT
TRAY FOLEY CATH SILVER 16FR (SET/KITS/TRAYS/PACK) ×2 IMPLANT
TUBE CONNECTING 20'X1/4 (TUBING) ×3
TUBE CONNECTING 20X1/4 (TUBING) ×7 IMPLANT
UNDERPAD 30X30 (UNDERPADS AND DIAPERS) ×10 IMPLANT
YANKAUER SUCT BULB TIP NO VENT (SUCTIONS) ×8 IMPLANT

## 2014-11-01 NOTE — Op Note (Signed)
Preoperative diagnosis: left breast dcis Postoperative diagnosis: same as above Procedure: 1. Right prophylactic nipple sparing mastectomy 2. Left nipple sparing mastectomy 3. Left axillary sentinel node biopsy Surgeon: Dr Serita Grammes Asst: Dr Irene Limbo Anesthesia: general with pec block EBL: 50 cc Drains per plastic surgery Complications none Specimens:  1. Right nsm marked short superior, long lateral, double na margin 2. Right nipple margin 3. Left nsm marked same way 4. Left nipple margin 7. Left axillary sentinel nodes with highest count of 680 Disposition case turned over to plastic surgery  Indications: This is a 21 yof who has left breast dcis.we have discussed all options as outlined in clinic notes. She desires bilateral nsm. Plan for bilateral nsm with left axillary sentinel node biopsy with immediate expander reconstruction.   Procedure: After informed consent was obtained she first underwent injection of technetium in the standard periareolar fashion. She also underwent a pectoral block. She was given antibiotics. She had SCDs in place. She was placed under general anesthesia without complication. She was then prepped and draped in the standard sterile surgical fashion. A surgical timeout was then performed.  I first performed the right nsm. I measured a 13 cm incision out 7 cm from xyphoid. This was made in the inframammary fold. I then used cautery to do the posterior plane removing the breast and the fascia from the pectoralis muscle to the parasternal region, clavicle and the latissimus. I then created the anterior flap with the plasmablade. The breast tissue was then removed. I removed the nipple margin as a separate specimen. Hemostasis was obtained. The flaps were viable and all breast tissue had been removed. We then packed this and moved to the other side.  There was activity in the axilla from the neoprobe. I made a similar incision in the IM fold. I  created flaps as above.. The breast tissue was removed in its entirety without injury to the skin. I then removed the nipple margin separately. Hemostasis was obtained. I then used the neoprobe to identify the sentinel nodes with counts above. I then turned the case over to Dr Iran Planas for reconstruction

## 2014-11-01 NOTE — Anesthesia Postprocedure Evaluation (Signed)
  Anesthesia Post-op Note  Patient: Angie Jennings  Procedure(s) Performed: Procedure(s) (LRB): LEFT NIPPLE SPARING MASTECTOMY WITH LEFT  SENTINAL LYMPH NODE BIOPSY AND RIGHT PROPYLACTIC NIPPLE SPARING MASTECTOMY (Bilateral) BILATERAL BREAST RECONSTRUCTION WITH PLACEMENT OF TISSUE EXPANDER AND FLEX HD (ACELLULAR HYDRATED DERMIS) (Bilateral)  Patient Location: PACU  Anesthesia Type: GA combined with regional for post-op pain  Level of Consciousness: awake and alert   Airway and Oxygen Therapy: Patient Spontanous Breathing  Post-op Pain: mild  Post-op Assessment: Post-op Vital signs reviewed, Patient's Cardiovascular Status Stable, Respiratory Function Stable, Patent Airway and No signs of Nausea or vomiting  Last Vitals:  Filed Vitals:   11/01/14 1300  BP: 135/69  Pulse: 69  Temp:   Resp: 17    Post-op Vital Signs: stable   Complications: No apparent anesthesia complications

## 2014-11-01 NOTE — Interval H&P Note (Signed)
History and Physical Interval Note:  11/01/2014 7:18 AM  Angie Jennings  has presented today for surgery, with the diagnosis of LEFT BREAST CANCER,   The various methods of treatment have been discussed with the patient and family. After consideration of risks, benefits and other options for treatment, the patient has consented to  Procedure(s): LEFT NIPPLE SPARING MASTECTOMY WITH LEFT  SENTINAL LYMPH NODE BIOPSY AND RIGHT PROPYLACTIC NIPPLE SPARING MASTECTOMY (Bilateral) BILATERAL BREAST RECONSTRUCTION WITH PLACEMENT OF TISSUE EXPANDER AND FLEX HD (ACELLULAR HYDRATED DERMIS) (Bilateral) as a surgical intervention .  The patient's history has been reviewed, patient examined, no change in status, stable for surgery.  I have reviewed the patient's chart and labs.  Questions were answered to the patient's satisfaction.     Karsten Howry

## 2014-11-01 NOTE — Anesthesia Procedure Notes (Addendum)
Anesthesia Regional Block:  Pectoralis block  Pre-Anesthetic Checklist: ,, timeout performed, Correct Patient, Correct Site, Correct Laterality, Correct Procedure, Correct Position, site marked, Risks and benefits discussed,  Surgical consent,  Pre-op evaluation,  At surgeon's request and post-op pain management  Laterality: Left  Prep: chloraprep       Needles:  Injection technique: Single-shot  Needle Type: Echogenic Needle     Needle Length: 9cm 9 cm Needle Gauge: 21 and 21 G    Additional Needles:  Procedures: ultrasound guided (picture in chart) Pectoralis block Narrative:  Injection made incrementally with aspirations every 5 mL.  Performed by: Personally  Anesthesiologist: ROSE, Iona Beard  Additional Notes: Patient tolerated the procedure well without complications   Procedure Name: Intubation Date/Time: 11/01/2014 8:41 AM Performed by: Melynda Ripple D Pre-anesthesia Checklist: Patient identified, Emergency Drugs available, Suction available and Patient being monitored Patient Re-evaluated:Patient Re-evaluated prior to inductionOxygen Delivery Method: Circle System Utilized Preoxygenation: Pre-oxygenation with 100% oxygen Intubation Type: IV induction Ventilation: Mask ventilation without difficulty Laryngoscope Size: Mac and 3 Grade View: Grade I Tube type: Oral Number of attempts: 1 Airway Equipment and Method: Stylet and Oral airway Placement Confirmation: ETT inserted through vocal cords under direct vision,  positive ETCO2 and breath sounds checked- equal and bilateral Secured at: 22 cm Tube secured with: Tape Dental Injury: Teeth and Oropharynx as per pre-operative assessment

## 2014-11-01 NOTE — Anesthesia Preprocedure Evaluation (Signed)
Anesthesia Evaluation  Patient identified by MRN, date of birth, ID band Patient awake    Reviewed: Allergy & Precautions, NPO status , Patient's Chart, lab work & pertinent test results  Airway Mallampati: II  TM Distance: >3 FB Neck ROM: Full    Dental no notable dental hx.    Pulmonary neg pulmonary ROS,    Pulmonary exam normal breath sounds clear to auscultation       Cardiovascular negative cardio ROS Normal cardiovascular exam Rhythm:Regular Rate:Normal     Neuro/Psych negative neurological ROS  negative psych ROS   GI/Hepatic Neg liver ROS, GERD  Medicated,  Endo/Other  negative endocrine ROS  Renal/GU negative Renal ROS  negative genitourinary   Musculoskeletal negative musculoskeletal ROS (+)   Abdominal   Peds negative pediatric ROS (+)  Hematology negative hematology ROS (+)   Anesthesia Other Findings   Reproductive/Obstetrics negative OB ROS                             Anesthesia Physical Anesthesia Plan  ASA: II  Anesthesia Plan: General   Post-op Pain Management: GA combined w/ Regional for post-op pain   Induction: Intravenous  Airway Management Planned: LMA  Additional Equipment:   Intra-op Plan:   Post-operative Plan: Extubation in OR  Informed Consent: I have reviewed the patients History and Physical, chart, labs and discussed the procedure including the risks, benefits and alternatives for the proposed anesthesia with the patient or authorized representative who has indicated his/her understanding and acceptance.   Dental advisory given  Plan Discussed with: CRNA and Surgeon  Anesthesia Plan Comments:         Anesthesia Quick Evaluation

## 2014-11-01 NOTE — Transfer of Care (Signed)
Immediate Anesthesia Transfer of Care Note  Patient: Angie Jennings  Procedure(s) Performed: Procedure(s): LEFT NIPPLE SPARING MASTECTOMY WITH LEFT  SENTINAL LYMPH NODE BIOPSY AND RIGHT PROPYLACTIC NIPPLE SPARING MASTECTOMY (Bilateral) BILATERAL BREAST RECONSTRUCTION WITH PLACEMENT OF TISSUE EXPANDER AND FLEX HD (ACELLULAR HYDRATED DERMIS) (Bilateral)  Patient Location: PACU  Anesthesia Type:General  Level of Consciousness: awake and alert   Airway & Oxygen Therapy: Patient Spontanous Breathing and Patient connected to face mask oxygen  Post-op Assessment: Report given to RN and Post -op Vital signs reviewed and stable  Post vital signs: Reviewed and stable  Last Vitals:  Filed Vitals:   11/01/14 0810  BP:   Pulse: 95  Temp:   Resp: 19    Complications: No apparent anesthesia complications

## 2014-11-01 NOTE — Op Note (Signed)
Operative Note   DATE OF OPERATION: 9.14.2016  LOCATION: [place] - observation  SURGICAL DIVISION: Plastic Surgery  PREOPERATIVE DIAGNOSES:  1. Left breast DCIS   POSTOPERATIVE DIAGNOSES:  same  PROCEDURE:  1. Bilateral breast reconstruction with placement of tissue expanders 2. Acellular dermis for breast reconstruction 100 cm2  SURGEON: Irene Limbo MD MBA  ASSISTANT: none  ANESTHESIA:  General.   EBL: 100 ml for entire case  COMPLICATIONS: None immediate.   INDICATIONS FOR PROCEDURE:  The patient, Angie Jennings, is a 64 y.o. female born on 01/26/1951, is here for immediate expander based reconstruction following bilateral nipple sparing mastectomies.   FINDINGS: Mentor Artoura High Profile 375 ml tissue expanders placed bilaterally. Initial fill volume 185 ml on each side. Ref [id number] Right SN [id number] Left SN [id number]  DESCRIPTION OF PROCEDURE:  The patient was marked with the patient in the preoperative area to mark sternal notch, chest midline, anterior axillary lines and inframammary folds. The patient was taken to the operating room. SCDs were placed and IV antibiotics were given. The patient's operative site was prepped and draped in a sterile fashion. A time out was performed and all information was confirmed to be correct. Following completion of port placement and mastectomies, reconstruction began on right side. The inferior insertions of pectoralis major muscle were divided and submuscular dissection completed toward clavicle. The anterior rectus fascia was elevated 1-2 cm below inframammary fold. Flex HD was perforated and sewn to inferior border of pectoralis major with running 3-0 vicryl. A 19 Fr drain was placed in subcutaneous laterally and submuscular position medialy and secured to skin with 2-0 nylon. The cavity was irrigated with solution containing Ancef, genatmicin, and bacitracin. Hemostasis was ensured. The tissue expander was prepared  and placed in submuscular position. The expander was secured to chest wall with a 3-0 vicryl. The inferior border of the acellular dermis was inset to elevated abdominal wall fascia inferiorly and laterally border was secured to serratus fascia. The incision was closed with 3-0 vicryl in fascial layer and 4-0 vicryl in dermis. Skin closure completed with 4-0 monocryl subcuticular and tissue adhesive. Over left breast, similarly the inferior pectoralis insertions were divided and anterior rectus fascia elevated for 1-2 cm below inframammary fold. The acellular dermis was prepared and secured to inferior border of pectoralis with 3-0 vicryl. 19 Fr drain placed in cavity prior to placement of expander. The inferior and lateral borders of acellular dermis secured with abdominal wall fascia and chest wall. Closure completed in similar fashion.  The ports were accessed and filled to 150 ml on left and 120 ml on right ml bilaterally. The patient was brought to upright position and the skin flaps were redraped so that NAC was symmetric from the sternal notch and midline. Transparent, adherent dressings applied.   The patient was allowed to wake from anesthesia, extubated and taken to the recovery room in satisfactory condition.   SPECIMENS: none  DRAINS: 19 Fr JP in right and left reconstructed breast  Irene Limbo, MD [facility] Plastic & Reconstructive Surgery [phone]

## 2014-11-01 NOTE — H&P (Signed)
   66 yof wife of Dr [name] (obgyn in [place]) who about a year ago underwent a left breast biopsy that was benign. she then underwent screening mm this year that showed new calcs in the left breast. right breast normal. her density is c. Mag views showed a 2.7 cm area of calcs. US of the breast and axilla were negative. she then underwent stereo biopsy of this area that shows er/pr negative high grade dcis. she was seen by Dr Ancil Linsey at [facility] already. she has been referred for genetics. she has also undergone mri. This shows a normal right breast, no abnormal nodes and a larger area of NME that measures 4.3x3x2 cm in size. this area is larger than extent of calcs on mm. the extent of nme does not correlate with her prior benign biopsy either. she has no complaints of a mass or discharge.  Other Problems Rolm Bookbinder, MD; 10/16/2014 8:53 AM) Arthritis Asthma Back Pain Breast Cancer Diverticulosis Gastroesophageal Reflux Disease Lump In Breast Oophorectomy Bilateral.  Past Surgical History Rolm Bookbinder, MD; 10/16/2014 8:53 AM) Breast Biopsy Left. multiple Hysterectomy (not due to cancer) - Complete Spinal Surgery - Lower Back  Diagnostic Studies History Rolm Bookbinder, MD; 10/16/2014 8:53 AM) Colonoscopy 5-10 years ago Mammogram within last year Pap Smear 1-5 years ago  Allergies Rolm Bookbinder, MD; 10/16/2014 8:53 AM) NSAIDs Kapidex *ULCER DRUGS* MetroNIDAZOLE *ANTI-INFECTIVE AGENTS - MISC.* Aspirin *ANALGESICS - NonNarcotic* Clarithromycin *MACROLIDES*  Medication History Rolm Bookbinder, MD; 10/16/2014 8:53 AM) Venlafaxine HCl (37.5MG  Tablet, Oral) Active. Aciphex (20MG  Tablet DR, Oral) Active. Citracal +D3 (250-107-500MG -MG-UNIT Tablet Chewable, Oral) Active.  Social History Rolm Bookbinder, MD; 10/16/2014 8:53 AM) Alcohol use Occasional alcohol use. Caffeine use Coffee, Tea. No drug use Tobacco  use Never smoker.  Family History Rolm Bookbinder, MD; 10/16/2014 8:53 AM) Alcohol Abuse Father. Arthritis Father. Bleeding disorder Son. Cancer Father, Mother. Prostate Cancer Brother. Respiratory Condition Father. Seizure disorder Daughter.  Physical Exam Rolm Bookbinder MD; 10/16/2014 8:48 AM) General Mental Status-Alert. Orientation-Oriented X3. Chest and Lung Exam Chest and lung exam reveals -on auscultation, normal breath sounds, no adventitious sounds and normal vocal resonance. Breast Nipples-No Discharge. Breast Lump-No Palpable Breast Mass. Cardiovascular Cardiovascular examination reveals -normal heart sounds, regular rate and rhythm with no murmurs. Lymphatic Head & Neck General Head & Neck Lymphatics: Bilateral - Description - Normal. Axillary General Axillary Region: Bilateral - Description - Normal.  Assessment & Plan Rolm Bookbinder MD; 10/16/2014 8:52 AM) DCIS (DUCTAL CARCINOMA IN SITU) (233.0  D05.10) right prophylactic nsm, left nsm, left axillary sentinel node biopsy we reviewed again indications for prophylactic mastectomy. plastics agreeable to nsm bilaterally. I have some concerns about ptosis but think this is not unreasonable. discussed surgery and sentinel node on left side. risks include but are not limited to bleeding, infection, loss of skin, loss of nac, removal of nipple due to cancer. will plan on proceeding next week.

## 2014-11-01 NOTE — Interval H&P Note (Signed)
History and Physical Interval Note:  11/01/2014 8:13 AM  Angie Jennings  has presented today for surgery, with the diagnosis of LEFT BREAST CANCER,   The various methods of treatment have been discussed with the patient and family. After consideration of risks, benefits and other options for treatment, the patient has consented to  Procedure(s): LEFT NIPPLE SPARING MASTECTOMY WITH LEFT  SENTINAL LYMPH NODE BIOPSY AND RIGHT PROPYLACTIC NIPPLE SPARING MASTECTOMY (Bilateral) BILATERAL BREAST RECONSTRUCTION WITH PLACEMENT OF TISSUE EXPANDER AND FLEX HD (ACELLULAR HYDRATED DERMIS) (Bilateral) as a surgical intervention .  The patient's history has been reviewed, patient examined, no change in status, stable for surgery.  I have reviewed the patient's chart and labs.  Questions were answered to the patient's satisfaction.     Shalayne Leach

## 2014-11-01 NOTE — Progress Notes (Signed)
Assisted Dr. Rose with left, ultrasound guided, pectoralis block. Side rails up, monitors on throughout procedure. See vital signs in flow sheet. Tolerated Procedure well. 

## 2014-11-02 ENCOUNTER — Encounter (HOSPITAL_BASED_OUTPATIENT_CLINIC_OR_DEPARTMENT_OTHER): Payer: Self-pay | Admitting: General Surgery

## 2014-11-02 ENCOUNTER — Ambulatory Visit: Payer: Self-pay | Admitting: Genetic Counselor

## 2014-11-02 ENCOUNTER — Telehealth: Payer: Self-pay | Admitting: Genetic Counselor

## 2014-11-02 DIAGNOSIS — D0512 Intraductal carcinoma in situ of left breast: Secondary | ICD-10-CM | POA: Diagnosis not present

## 2014-11-02 DIAGNOSIS — Z1379 Encounter for other screening for genetic and chromosomal anomalies: Secondary | ICD-10-CM

## 2014-11-02 DIAGNOSIS — C50912 Malignant neoplasm of unspecified site of left female breast: Secondary | ICD-10-CM

## 2014-11-02 MED ORDER — METHOCARBAMOL 500 MG PO TABS: 500.0000 mg | ORAL_TABLET | Freq: Four times a day (QID) | ORAL | Status: DC | PRN

## 2014-11-02 MED ORDER — SULFAMETHOXAZOLE-TRIMETHOPRIM 800-160 MG PO TABS
1.0000 | ORAL_TABLET | Freq: Two times a day (BID) | ORAL | Status: DC
Start: 2014-11-02 — End: 2014-11-30

## 2014-11-02 MED ORDER — OXYCODONE HCL 5 MG PO TABS: 5.0000 mg | ORAL_TABLET | ORAL | Status: DC | PRN

## 2014-11-02 NOTE — Progress Notes (Signed)
HPI: [name] was previously seen in the [facility] due to a personal and family history of cancer and concerns regarding a hereditary predisposition to cancer. Please refer to our prior cancer genetics clinic note for more information regarding [name]'s medical, social and family histories, and our assessment and recommendations, at the time. [name] recent genetic test results were disclosed to her, as were recommendations warranted by these results. These results and recommendations are discussed in more detail below.  FAMILY HISTORY:  We obtained a detailed, 4-generation family history.  Significant diagnoses are listed below: Family History  Problem Relation Age of Onset  . Dementia Mother   . Esophageal cancer Mother 28  . Hypertension Father   . Lung cancer Father 31    smoker  . Prostate cancer Brother 81  . Dementia Maternal Aunt   . Bladder Cancer Maternal Uncle 76    former smoker  . Heart attack Maternal Grandmother   . Dementia Maternal Grandfather   . Esophageal cancer Maternal Aunt 72    non-smoker  . Brain cancer Maternal Uncle 63    Meningioma - benign  . Esophageal cancer Maternal Uncle 61    The patient has three children ranging in age from 26-35. She has a brother who had prostate cancer at age 43. Her father was adopted, and died of lung cancer from smoking. Her mother had esophageal cancer at 28 and died at 60. Her mother had three brothers and two sisters. One brother was a smoker and had bladder cancer at 4, one brother had a benign brain tumor at 29, and the third brother had esophageal cancer at 36. One sister had esophageal cancer at 38 and the other sister had dementia. The patient's maternal grandmother had dementia and died at 61, and her grandfather had a heart attack at 14. Patient's maternal ancestors are of [place] descent, and paternal ancestors are of Caucasian descent. There is no reported  Ashkenazi Jewish ancestry. There is no known consanguinity.  GENETIC TEST RESULTS: At the time of [name]'s visit, we recommended she pursue genetic testing of the Breast/Ovarian cancer gene panel. The Breast/Ovarian gene panel offered by [facility] includes sequencing and rearrangement analysis for the following 20 genes:  ATM, BARD1, BRCA1, BRCA2, BRIP1, CDH1, CHEK2, EPCAM, FANCC, MLH1, MSH2, MSH6, NBN, PALB2, PMS2, PTEN, RAD51C, RAD51D, TP53, and XRCC2.   The report date is October 27, 2014.  Genetic testing was normal, and did not reveal a deleterious mutation in these genes. The test report has been scanned into EPIC and is located under the Molecular Pathology section of the Results Review tab.   We discussed with [name] that since the current genetic testing is not perfect, it is possible there may be a gene mutation in one of these genes that current testing cannot detect, but that chance is small. We also discussed, that it is possible that another gene that has not yet been discovered, or that we have not yet tested, is responsible for the cancer diagnoses in the family, and it is, therefore, important to remain in touch with cancer genetics in the future so that we can continue to offer [name] the most up to date genetic testing.   CANCER SCREENING RECOMMENDATIONS: This result is reassuring and indicates that [name] likely does not have an increased risk for a future cancer due to a mutation in one of these genes. This normal test also suggests that [name]'s  cancer was most likely not due to an inherited predisposition associated with one of these genes.  Most cancers happen by chance and this negative test suggests that her cancer falls into this category.  We, therefore, recommended she continue to follow the cancer management and screening guidelines provided by her oncology and primary healthcare provider.   RECOMMENDATIONS FOR FAMILY MEMBERS: Women in this family  might be at some increased risk of developing cancer, over the general population risk, simply due to the family history of cancer. We recommended women in this family have a yearly mammogram beginning at age 60, or 17 years younger than the earliest onset of cancer, an an annual clinical breast exam, and perform monthly breast self-exams. Women in this family should also have a gynecological exam as recommended by their primary provider. All family members should have a colonoscopy by age 33.  FOLLOW-UP: Lastly, we discussed with [name] that cancer genetics is a rapidly advancing field and it is possible that new genetic tests will be appropriate for her and/or her family members in the future. We encouraged her to remain in contact with cancer genetics on an annual basis so we can update her personal and family histories and let her know of advances in cancer genetics that may benefit this family.   Our contact number was provided. [name] questions were answered to her satisfaction, and she knows she is welcome to call us at anytime with additional questions or concerns.   Roma Kayser, MS, [facility] Certified Genetic Counselor [email or web address]

## 2014-11-02 NOTE — Discharge Instructions (Signed)

## 2014-11-02 NOTE — Telephone Encounter (Signed)
Revealed negative genetic testing on the Breast/Ovarian cancer panel.  We can discuss reults with her daughters Trinda Pascal and Verline Lema.

## 2014-11-15 ENCOUNTER — Other Ambulatory Visit: Payer: Self-pay | Admitting: *Deleted

## 2014-11-15 DIAGNOSIS — D0512 Intraductal carcinoma in situ of left breast: Secondary | ICD-10-CM

## 2014-11-24 ENCOUNTER — Encounter (HOSPITAL_COMMUNITY): Payer: Self-pay

## 2014-11-27 ENCOUNTER — Ambulatory Visit: Payer: BC Managed Care – PPO | Attending: General Surgery | Admitting: Physical Therapy

## 2014-11-27 DIAGNOSIS — M7582 Other shoulder lesions, left shoulder: Secondary | ICD-10-CM | POA: Diagnosis present

## 2014-11-27 DIAGNOSIS — M25511 Pain in right shoulder: Secondary | ICD-10-CM | POA: Insufficient documentation

## 2014-11-27 DIAGNOSIS — M25611 Stiffness of right shoulder, not elsewhere classified: Secondary | ICD-10-CM

## 2014-11-27 DIAGNOSIS — M25612 Stiffness of left shoulder, not elsewhere classified: Secondary | ICD-10-CM

## 2014-11-27 DIAGNOSIS — Z9189 Other specified personal risk factors, not elsewhere classified: Secondary | ICD-10-CM | POA: Insufficient documentation

## 2014-11-27 NOTE — Therapy (Signed)
[place], [place], [address] Phone: [phone]   Fax:  [phone]  Physical Therapy Treatment  Patient Details  Name: Angie Jennings MRN: [id number] Date of Birth: 02-07-1951 Referring Provider:  Rolm Bookbinder, MD  Encounter Date: 11/27/2014      PT End of Session - 11/27/14 1504    Visit Number 1   Number of Visits 2   Date for PT Re-Evaluation 01/07/15   PT Start Time 0935   PT Stop Time 1025   PT Time Calculation (min) 50 min   Activity Tolerance Patient tolerated treatment well   Behavior During Therapy [facility] for tasks assessed/performed      Past Medical History  Diagnosis Date  . GERD (gastroesophageal reflux disease)   . Anxiety   . Childhood asthma   . Radiation     as child for treatment of keloids on face and neck  . 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 Esophagogastroduodenoscopy  04/20/09    mild gastritis  . Esophagogastroduodenoscopy  07/27/09    small internal hemorrhoids/rare sigmoid colon diverticula other wise no polyps  . Bravo capsule endoscopy  05/02/09    GERD with adequate acid suppression  . Abdominal hysterectomy  2006  . Esophagogastroduodenoscopy  11/21/2011    Procedure: ESOPHAGOGASTRODUODENOSCOPY (EGD);  Surgeon: Danie Binder, MD;  [facility];  Service: Endoscopy;  Laterality: N/A;  2:00  . Bravo ph study  11/21/2011    Procedure: BRAVO [facility] [place];  Surgeon: Danie Binder, MD;  [facility];  Service: Endoscopy;  Laterality: N/A;  . Breast surgery  AUGUST 2015    BREAST BIOPSY   . Colonoscopy  11/21/2011  . Breast biopsy  10/05/14  . Back surgery  1995    fusion of L4 & L5  . Back surgery      lumbar fusion  . Nipple sparing mastectomy/sentinal lymph node biopsy/reconstruction/placement of tissue expander Bilateral 11/01/2014    Procedure: LEFT NIPPLE SPARING MASTECTOMY WITH  LEFT  SENTINAL LYMPH NODE BIOPSY AND RIGHT PROPYLACTIC NIPPLE SPARING MASTECTOMY;  Surgeon: Rolm Bookbinder, MD;  Location: [facility];  Service: General;  Laterality: Bilateral;  . Breast reconstruction with placement of tissue expander and flex hd (acellular hydrated dermis) Bilateral 11/01/2014    Procedure: BILATERAL BREAST RECONSTRUCTION WITH PLACEMENT OF TISSUE EXPANDER AND FLEX HD (ACELLULAR HYDRATED DERMIS);  Surgeon: Irene Limbo, MD;  Location: [facility];  Service: Plastics;  Laterality: Bilateral;    There were no vitals filed for this visit.  Visit Diagnosis:  Decreased ROM of right shoulder - Plan: PT plan of care cert/re-cert  Decreased ROM of left shoulder - Plan: PT plan of care cert/re-cert  At risk for lymphedema - Plan: PT plan of care cert/re-cert      Subjective Assessment - 11/27/14 0936    Subjective I think it's standard practice for Dr. Donne Hazel to send me to make sure I get the ROM back.   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  Had immediate reconstruction with two fills so far; third will be on Thursday this week.  Still has drains in.  Has been okayed to reach up to wash her hair and to lift up to 5 lbs.  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  Hysterectomy 10 years ago.  Back surgery with fusion and Ray cages some 22 years ago.   Patient Stated Goals get info that she needs and be sure that she will regain mobility   Currently in Pain? Yes   Pain Score 2    Pain Location Chest   Pain Orientation Right;Left   Pain Descriptors / Indicators Tightness;Other (Comment)  "a minor annoyance"   Aggravating Factors  getting expanders filled   Pain Relieving Factors light compression on the area            [facility] PT  Assessment - 11/27/14 0001    Assessment   Medical Diagnosis left breast cancer with bilateral mastectomy   Onset Date/Surgical Date 11/01/14   Precautions   Precautions Other (comment)  still with drains in; 5 lb. lifting restriction   Restrictions   Weight Bearing Restrictions No   Balance Screen   Has the patient fallen in the past 6 months No   Has the patient had a decrease in activity level because of a fear of falling?  No   Is the patient reluctant to leave their home because of a fear of falling?  No   Home Environment   Living Environment Private residence   Living Arrangements Spouse/significant other   Home Layout Two level;Able to live on main level with bedroom/bathroom   Prior Function   Level of Independence Independent   Vocation Retired   Surveyor, minerals   Overall Cognitive Status Within Functional Limits for tasks assessed   Observation/Other Assessments   Observations nipple-sparing mastectomy bilat.; incisions healed; some unevenness of reconstructions, which patient and doctor have discussed   Posture/Postural Control   Posture/Postural Control Postural limitations   Postural Limitations Rounded Shoulders   ROM / Strength   AROM / PROM / Strength AROM   AROM   AROM Assessment Site Shoulder   Right/Left Shoulder Right;Left   Right Shoulder Extension 50 Degrees   Right Shoulder Flexion 133 Degrees   Right Shoulder ABduction 167 Degrees   Right Shoulder Internal Rotation 56 Degrees   Right Shoulder External Rotation 80 Degrees   Left Shoulder Extension 55 Degrees   Left Shoulder Flexion 135 Degrees   Left Shoulder ABduction 150 Degrees   Left Shoulder Internal Rotation 65 Degrees   Left Shoulder External Rotation 80 Degrees                             PT Education - 11/27/14 1503    Education provided Yes   Education Details educated about ABC class and how to attend; gave ABC class handout and introduced her to  topics on those, including getting a compression sleeve for airplane flight and vigorous activity   Person(s) Educated Patient   Methods Explanation;Handout   Comprehension Verbalized understanding                [place] - 11/27/14 1512    CC Long Term Goal  #1   Title Patient will be independent in a home exercise program for shoulder ROM.   Time 4   Period Weeks   Status New   CC Long Term Goal  #2   Title Patient will be knowledgeable about lymphedema risk reduction practices.   Time 4   Period Weeks   Status New   CC Long Term  Goal  #3   Title Patient will show progress in bilateral shoulder AROM, including active flexion to at least 150 degrees.   Baseline flexion was 133 and 135 degrees on eval   Time 4   Period Weeks   Status New            Plan - 11/27/14 1504    Clinical Impression Statement This is a very energetic and athletic-sounding woman who is status post bilateral mastectomy with one sentinel node removed for left breast cancer.  She is approx. 4 weeks post-op for this and immediate reconstruction with placement of expanders; she has had two fills of expanders.  She still has drains in place on both sides.  Given all of this, she has mild AROM deficits at both shoulders but is really doing quite well with drains still in place.  She was asked to check with Dr. Iran Planas about whether she has any ongoing restriction in doing ROM exercises, and if not, to proceed with instruction she has already received and information she got today.    She was encouraged to come to the ABC class for some more information and to return for a follow-up visit with [place] in about a month to ensure that her AROM is improving.  She was also encouraged to get a compression sleeve for vigorous activity, while being told that her risk for lymphedema is extremely low (in the low single digits).   Pt will benefit from skilled therapeutic intervention in order to improve on  the following deficits Decreased range of motion;Decreased knowledge of precautions;Impaired UE functional use   Rehab Potential Excellent   Clinical Impairments Affecting Rehab Potential one drain still in place on each side for double mastectomy   PT Frequency Other (comment)  one additional visit for follow-up in approx. 1 month   PT Treatment/Interventions Patient/family education   PT Next Visit Plan Reassess AROM of both shoulders; assess knowledge of safe self-progression of exercise and of lymphedema risk reduction practices; check on whether patient has returned to her swimming and other workouts.   PT Home Exercise Plan When allowed by plastic surgeon, do shoulder stretches and cardiovascular workouts just with legs to start, such as on a treadmill.   Recommended Other Services ABC class   Consulted and Agree with Plan of Care Patient        Problem List Patient Active Problem List   Diagnosis Date Noted  . DCIS (ductal carcinoma in situ) 11/01/2014  . Genetic testing 10/31/2014  . Ductal carcinoma in situ (DCIS) of left breast 10/24/2014  . Travel advice encounter 09/29/2014  . Breast cancer ([place]) 09/18/2014  . GERD 05/29/2009  . DIARRHEA 05/29/2009    Doriann Zuch 11/27/2014, 3:16 PM  [place], [place], [address] Phone: [phone]   Fax:  [place], PT 11/27/2014 3:16 PM

## 2014-11-30 ENCOUNTER — Encounter: Payer: Self-pay | Admitting: Nurse Practitioner

## 2014-11-30 ENCOUNTER — Ambulatory Visit (HOSPITAL_BASED_OUTPATIENT_CLINIC_OR_DEPARTMENT_OTHER): Payer: BC Managed Care – PPO | Admitting: Nurse Practitioner

## 2014-11-30 VITALS — BP 158/74 | HR 83 | Temp 98.2°F | Resp 20 | Ht 67.0 in | Wt 160.9 lb

## 2014-11-30 DIAGNOSIS — Z9013 Acquired absence of bilateral breasts and nipples: Secondary | ICD-10-CM

## 2014-11-30 DIAGNOSIS — Z9882 Breast implant status: Secondary | ICD-10-CM

## 2014-11-30 DIAGNOSIS — D0512 Intraductal carcinoma in situ of left breast: Secondary | ICD-10-CM

## 2014-11-30 DIAGNOSIS — Z171 Estrogen receptor negative status [ER-]: Secondary | ICD-10-CM | POA: Diagnosis not present

## 2014-11-30 DIAGNOSIS — C50912 Malignant neoplasm of unspecified site of left female breast: Secondary | ICD-10-CM

## 2014-11-30 NOTE — Progress Notes (Signed)
CLINIC:  Cancer Survivorship   REASON FOR VISIT:  Routine follow-up post-treatment for a recent history of breast cancer.  BRIEF ONCOLOGIC HISTORY:    Breast cancer ([place])   09/27/2014 Mammogram Left breast: new area of calcifications   10/05/2014 Breast US Left breast: developing grouped linear calcifications following a ductal pattern spanning an area of 2.7 cm.   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   10/12/2014 Clinical Stage Stage 0: Tis N0   10/16/2014 Procedure Genetic testing: Breast/Ovarian panel ([facility]) revealed no clinically significant variant at ATM, BARD1, BRCA1, BRCA2, BRIP1, CDH1, CHEK2, EPCAM, FANCC, MLH1, MSH2, MSH6, NBN, PALB2, PMS2, PTEN, RAD51C, RAD51D, TP53, and XRCC2.   11/01/2014 Definitive Surgery Bilateral mastectomy / SLNB with immediate reconstruction.  RIGHT: negative for malignancy.  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 1 LN removed and negative (0/1)   11/01/2014 Pathologic Stage Stage 0: Tis N0    INTERVAL HISTORY:  Angie Jennings presents to the [facility] today for our initial meeting to review her survivorship care plan detailing her treatment course for breast cancer, as well as monitoring long-term side effects of that treatment, education regarding health maintenance, screening, and overall wellness and health promotion.     Overall, Angie Jennings reports doing well following her surgery approximately four weeks ago.  She continues with progressive expansion with her saline implants and still has bilateral drains in place. She reports continued fatigue, but denies cough, shortness of breath, weight change or headache. She has tightness  along her implants, but denies erythema or pain.  She will see Dr. Iran Planas in approximately 10 days for drain removal and next expansion.  She plans to undergo implant exchange after the first of the year and is still considering saline vs. silicone implants. She has chronic back pain, which is unchanged, but otherwise denies pain.   REVIEW OF SYSTEMS:  General: Denies fever, chills, or night sweats. Cardiac: Denies palpitations, chest pain, and lower extremity edema.  Respiratory: Denies dyspnea on exertion.  GI: Denies abdominal pain, constipation, diarrhea, nausea, or vomiting.  GU: Denies dysuria, hematuria, vaginal bleeding, vaginal discharge, or vaginal dryness.  Musculoskeletal: Chronic back pain, as above.    Neuro: Denies headache or recent falls. Denies peripheral neuropathy. Skin: Denies rash, pruritis, or open wounds.   Psych: Difficulty sleeping due to positioning (unable to sleep on side or prone). Denies depression, anxiety,  or memory loss.   A 14-point review of systems was completed and was negative, except as noted above.   ONCOLOGY TREATMENT TEAM:  1. Surgeon:  Dr. Donne Hazel at [facility]  2. Medical Oncologist: Dr. Whitney Muse 3. Plastic Surgeon: Dr. Iran Planas     PAST MEDICAL/SURGICAL HISTORY:  Past Medical History  Diagnosis Date  . GERD (gastroesophageal reflux disease)   . Anxiety   . Childhood asthma   . Radiation     as child for treatment of keloids on face and neck  . Breast cancer ([place]) 09/2014    ER-/PR- DCIS   Past Surgical History  Procedure Laterality Date  . Esophagogastroduodenoscopy  04/20/09    mild gastritis  . Esophagogastroduodenoscopy  07/27/09    small internal hemorrhoids/rare sigmoid colon diverticula other wise no polyps  .  Bravo capsule endoscopy  05/02/09    GERD with adequate acid suppression  . Abdominal hysterectomy  2006  . Esophagogastroduodenoscopy  11/21/2011    Procedure: ESOPHAGOGASTRODUODENOSCOPY (EGD);   Surgeon: Danie Binder, MD;  [facility];  Service: Endoscopy;  Laterality: N/A;  2:00  . Bravo ph study  11/21/2011    Procedure: BRAVO [place];  Surgeon: Danie Binder, MD;  [facility];  Service: Endoscopy;  Laterality: N/A;  . Breast surgery  AUGUST 2015    BREAST BIOPSY   . Colonoscopy  11/21/2011  . Breast biopsy  10/05/14  . Back surgery  1995    fusion of L4 & L5  . Back surgery      lumbar fusion  . Nipple sparing mastectomy/sentinal lymph node biopsy/reconstruction/placement of tissue expander Bilateral 11/01/2014    Procedure: LEFT NIPPLE SPARING MASTECTOMY WITH LEFT  SENTINAL LYMPH NODE BIOPSY AND RIGHT PROPYLACTIC NIPPLE SPARING MASTECTOMY;  Surgeon: Rolm Bookbinder, MD;  Location: [place];  Service: General;  Laterality: Bilateral;  . Breast reconstruction with placement of tissue expander and flex hd (acellular hydrated dermis) Bilateral 11/01/2014    Procedure: BILATERAL BREAST RECONSTRUCTION WITH PLACEMENT OF TISSUE EXPANDER AND FLEX HD (ACELLULAR HYDRATED DERMIS);  Surgeon: Irene Limbo, MD;  Location: [place];  Service: Plastics;  Laterality: Bilateral;     ALLERGIES:  Allergies  Allergen Reactions  . Clarithromycin   . Ivp Dye [Iodinated Diagnostic Agents] Hives  . Kapidex [Dexlansoprazole] Diarrhea  . Lidocaine Nausea Only    Feels hot and flushing  . Metronidazole   . Nsaids      CURRENT MEDICATIONS:  Current Outpatient Prescriptions on File Prior to Visit  Medication Sig Dispense Refill  . Calcium Citrate-Vitamin D (CITRACAL PETITES/VITAMIN D PO) Take 2 tablets by mouth daily.    [name] Kitchen doxycycline (VIBRA-TABS) 100 MG tablet Take 1 tablet (100 mg total) by mouth daily. 37 tablet 0  . methocarbamol (ROBAXIN) 500 MG tablet Take 1 tablet (500 mg total) by mouth every 6 (six) hours as needed for muscle spasms. 40 tablet 0  . RABEprazole (ACIPHEX) 20 MG tablet TAKE ONE TABLET BY MOUTH TWICE DAILY.  (Patient taking differently: TAKE ONE TABLET BY MOUTH once DAILY.) 60 tablet 3  . venlafaxine (EFFEXOR) 37.5 MG tablet One tablet daily 30 tablet 11  . acetaminophen (TYLENOL) 500 MG tablet Take 500 mg by mouth every 6 (six) hours as needed.    [name] Kitchen oxyCODONE (OXY IR/ROXICODONE) 5 MG immediate release tablet Take 1-2 tablets (5-10 mg total) by mouth every 4 (four) hours as needed for moderate pain. (Patient not taking: Reported on 11/27/2014) 50 tablet 0   No current facility-administered medications on file prior to visit.     ONCOLOGIC FAMILY HISTORY:  Family History  Problem Relation Age of Onset  . Dementia Mother   . Esophageal cancer Mother 41  . Hypertension Father   . Lung cancer Father 82    smoker  . Prostate cancer Brother 23  . Dementia Maternal Aunt   . Bladder Cancer Maternal Uncle 12    former smoker  . Heart attack Maternal Grandmother   . Dementia Maternal Grandfather   . Esophageal cancer Maternal Aunt 72    non-smoker  . Brain cancer Maternal Uncle 63    Meningioma - benign  . Esophageal cancer Maternal Uncle 61     GENETIC COUNSELING/TESTING: Performed 10/16/2014: Breast/Ovarian panel ([facility]) revealed no clinically significant variant at ATM, BARD1,  BRCA1, BRCA2, BRIP1, CDH1, CHEK2, EPCAM, FANCC, MLH1, MSH2, MSH6, NBN, PALB2, PMS2, PTEN, RAD51C, RAD51D, TP53, and XRCC2.  SOCIAL HISTORY:  Angie Jennings is married and lives with her spouse in [place].  She has 3 children.  Ms. 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  She denies any current or history of tobacco or illicit drug use.  She does use alcohol socially.   PHYSICAL EXAMINATION:  Vital Signs:   Filed Vitals:   11/30/14 1518  BP: 158/74  Pulse: 83  Temp: 98.2 F (36.8 C)  Resp: 20   ECOG Performance status: 0 General: Well-nourished, well-appearing female in no acute distress.  She is  unaccompanied in clinic today.   HEENT: Head is atraumatic and normocephalic.  Conjunctivae clear without exudate.  Sclerae anicteric. Oral mucosa is pink, moist, and intact without lesions.   Lymph: No cervical, supraclavicular, infraclavicular, or axillary lymphadenopathy noted on palpation.  Cardiovascular: Regular rate and rhythm without murmurs, rubs, or gallops. Respiratory: Clear to auscultation bilaterally. Chest expansion symmetric without accessory muscle use on inspiration or expiration.  GI: Abdomen soft and round. No tenderness to palpation. Bowel sounds normoactive in 4 quadrants.  GU: Deferred.  Musculoskeletal: Muscle strength 5/5 in all extremities.  Neuro: No focal deficits. Steady gait.  Psych: Mood and affect normal and appropriate for situation.  Extremities: No edema, cyanosis, or clubbing.  Skin: Warm and dry. No open lesions noted.   LABORATORY DATA:  None for this visit.  DIAGNOSTIC IMAGING:  None for this visit.     ASSESSMENT AND PLAN:   1. 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  Ms. Leath is doing well without clinical symptoms worrisome for disease recurrence.  She is proceeding with her saline expander fills and will undergo exchange for either saline or silicone implants after the first of the year.  She will follow-up with her medical oncologist,  Dr. Whitney Muse, and Dr. Donne Hazel, for ongoing surveillance..  At this time, she does not have a follow up appointment with Dr. Whitney Muse, but will call her office to schedule that appointment following her visit today.  A comprehensive survivorship care plan and treatment summary was reviewed with the patient today detailing her breast cancer diagnosis, treatment course, potential late/long-term effects of treatment, appropriate follow-up care with  recommendations for the future, and patient education resources.  A copy of this summary, along with a letter will be sent to the patient's primary care provider via in basket message after today's visit.  Ms. Riles is welcome to return to the Survivorship Clinic in the future, as needed; no follow-up will be scheduled at this time.    2. Cancer screening:  Due to Ms. Oquinn's history and her age, she should receive screening for skin cancers and colon cancer.  The information and recommendations are listed on the patient's comprehensive care plan/treatment summary and were reviewed in detail with the patient.    3. Health maintenance and wellness promotion: Ms. Choate was encouraged to consume 5-7 servings of fruits and vegetables per day. We reviewed the "Nutrition Rainbow" handout, as well as the handout about "Nutrition for Breast Cancer Survivors."  She was also encouraged to engage in moderate to vigorous exercise for 30 minutes per day most days of the week after  completion of her reconstruction.. We discussed the [employer] fitness program, which is designed for cancer survivors to help them become more physically fit after cancer treatments.  She was instructed to limit her alcohol consumption and continue to abstain from tobacco use.   4. Support services/counseling: It is not uncommon for this period of the patient's cancer care trajectory to be one of many emotions and stressors.  We discussed an opportunity for her to participate in the next session of [facility] ("Finding Your New Normal") support group series designed for patients after they have completed treatment.   Ms. Devino was encouraged to take advantage of our many other support services programs, support groups, and/or counseling in coping with her new life as a cancer survivor after completing anti-cancer treatment.  She was offered support today through active listening and expressive supportive counseling.  She was given  information regarding our available services and encouraged to contact me with any questions or for help enrolling in any of our support group/programs.    A total of 50 minutes of face-to-face time was spent with this patient with greater than 50% of that time in counseling and care-coordination.   Sylvan Cheese, NP  Survivorship Program [facility] [phone]   Note: [employer] [name], [place] [phone]

## 2014-12-04 ENCOUNTER — Encounter: Payer: Self-pay | Admitting: Nurse Practitioner

## 2014-12-14 ENCOUNTER — Other Ambulatory Visit (HOSPITAL_COMMUNITY): Payer: Self-pay | Admitting: *Deleted

## 2014-12-14 DIAGNOSIS — D0512 Intraductal carcinoma in situ of left breast: Secondary | ICD-10-CM

## 2014-12-14 DIAGNOSIS — C50912 Malignant neoplasm of unspecified site of left female breast: Secondary | ICD-10-CM

## 2014-12-25 ENCOUNTER — Ambulatory Visit: Payer: BC Managed Care – PPO | Attending: General Surgery

## 2014-12-25 DIAGNOSIS — Z9189 Other specified personal risk factors, not elsewhere classified: Secondary | ICD-10-CM | POA: Diagnosis present

## 2014-12-25 DIAGNOSIS — M25611 Stiffness of right shoulder, not elsewhere classified: Secondary | ICD-10-CM

## 2014-12-25 DIAGNOSIS — M25511 Pain in right shoulder: Secondary | ICD-10-CM | POA: Insufficient documentation

## 2014-12-25 DIAGNOSIS — M25612 Stiffness of left shoulder, not elsewhere classified: Secondary | ICD-10-CM

## 2014-12-25 DIAGNOSIS — M7582 Other shoulder lesions, left shoulder: Secondary | ICD-10-CM | POA: Diagnosis present

## 2014-12-25 NOTE — Patient Instructions (Signed)
Laying on floor with small towel roll along spine between shoulder blades for opening arms across chest and then "V" motion starting at waist 10 reps each 1-2 times a day.  Get a ball for the wall: Roll in front 10 reps, then out to side 5-10 reps each with slight forward lean at end range.  Continue doorway stretch daily: 2-3 reps/day 3-5 reps, holding 10-20 seconds.  Modified downward on wall 3-5 reps hold 10 seconds

## 2014-12-25 NOTE — Therapy (Signed)
[place], [place], [address] Phone: [phone]   Fax:  [phone]  Physical Therapy Treatment  Patient Details  Name: Angie Jennings MRN: [id number] Date of Birth: 1950-08-08 No Data Recorded  Encounter Date: 12/25/2014      PT End of Session - 12/25/14 1020    Visit Number 2   Number of Visits 2   Date for PT Re-Evaluation 01/07/15   PT Start Time 0938   PT Stop Time 1017   PT Time Calculation (min) 39 min   Activity Tolerance Patient tolerated treatment well   Behavior During Therapy [facility] for tasks assessed/performed      Past Medical History  Diagnosis Date  . GERD (gastroesophageal reflux disease)   . Anxiety   . Childhood asthma   . Radiation     as child for treatment of keloids on face and neck  . Breast cancer ([place]) 09/2014    ER-/PR- DCIS    Past Surgical History  Procedure Laterality Date  . Esophagogastroduodenoscopy  04/20/09    mild gastritis  . Esophagogastroduodenoscopy  07/27/09    small internal hemorrhoids/rare sigmoid colon diverticula other wise no polyps  . Bravo capsule endoscopy  05/02/09    GERD with adequate acid suppression  . Abdominal hysterectomy  2006  . Esophagogastroduodenoscopy  11/21/2011    Procedure: ESOPHAGOGASTRODUODENOSCOPY (EGD);  Surgeon: Danie Binder, MD;  [facility];  Service: Endoscopy;  Laterality: N/A;  2:00  . Bravo ph study  11/21/2011    Procedure: BRAVO [place];  Surgeon: Danie Binder, MD;  [facility];  Service: Endoscopy;  Laterality: N/A;  . Breast surgery  AUGUST 2015    BREAST BIOPSY   . Colonoscopy  11/21/2011  . Breast biopsy  10/05/14  . Back surgery  1995    fusion of L4 & L5  . Back surgery      lumbar fusion  . Nipple sparing mastectomy/sentinal lymph node biopsy/reconstruction/placement of tissue expander Bilateral 11/01/2014    Procedure: LEFT NIPPLE SPARING MASTECTOMY WITH LEFT  SENTINAL LYMPH  NODE BIOPSY AND RIGHT PROPYLACTIC NIPPLE SPARING MASTECTOMY;  Surgeon: Rolm Bookbinder, MD;  Location: [place];  Service: General;  Laterality: Bilateral;  . Breast reconstruction with placement of tissue expander and flex hd (acellular hydrated dermis) Bilateral 11/01/2014    Procedure: BILATERAL BREAST RECONSTRUCTION WITH PLACEMENT OF TISSUE EXPANDER AND FLEX HD (ACELLULAR HYDRATED DERMIS);  Surgeon: Irene Limbo, MD;  Location: [facility] [facility];  Service: Plastics;  Laterality: Bilateral;    There were no vitals filed for this visit.  Visit Diagnosis:  Decreased ROM of right shoulder  Decreased ROM of left shoulder  At risk for lymphedema      Subjective Assessment - 12/25/14 0948    Subjective Want to make sure I'm doing the doorway stretch right bc I know I need that one for my chest. I can't wait to get in the pool! My drains finally came out after 6 weeks (2 weeks ago) so I've been walking on the treadmill instead.  No pain today, just the chest tightness.    Currently in Pain? No/denies            [facility] PT Assessment - 12/25/14 0001    AROM   Right Shoulder Extension 52 Degrees   Right Shoulder Flexion 148 Degrees   Right Shoulder ABduction 173 Degrees   Right Shoulder Internal Rotation 90 Degrees  Right Shoulder External Rotation 90 Degrees   Left Shoulder Extension 57 Degrees   Left Shoulder Flexion 146 Degrees   Left Shoulder ABduction 165 Degrees   Left Shoulder Internal Rotation 90 Degrees   Left Shoulder External Rotation 71 Degrees                     [facility] Adult PT Treatment/Exercise - 12/25/14 0001    Shoulder Exercises: Supine   Horizontal ABduction AROM;Both;10 reps  On towel roll   Other Supine Exercises On towel roll for bil D2 "V" 10 reps   Shoulder Exercises: Therapy Ball   Flexion 10 reps  Roll yellow ball up wall   ABduction 5 reps  Bil UE rolling yellow ball up wall   Shoulder Exercises: Stretch    Corner Stretch 3 reps;10 seconds  In doorway   Other Shoulder Stretches Modified downward dog on wall 3 reps 5 second holds   Other Shoulder Stretches Tried neural tension stretch bil but pt had no tightness with these so stopped                PT Education - 12/25/14 1015    Education provided Yes   Education Details Issued a few extra shoulder flexibility exercises   Person(s) Educated Patient   Methods Explanation;Demonstration;Handout   Comprehension Verbalized understanding;Returned demonstration                [facility] Goals - 12/25/14 1228    CC Long Term Goal  #1   Title Patient will be independent in a home exercise program for shoulder ROM.   Status Achieved   CC Long Term Goal  #2   Title Patient will be knowledgeable about lymphedema risk reduction practices.   Status Achieved   CC Long Term Goal  #3   Title Patient will show progress in bilateral shoulder AROM, including active flexion to at least 150 degrees.  Rt 148 and Lt 146 degrees attained today 12/25/14. Pt is please with this thus far and will be continuing her stretching at home.   Status Partially Met            Plan - 12/25/14 1222    Clinical Impression Statement Pt has been doing very well since her evaluation a month ago. Her drains were removed 2 weeks ago and she hasn't been swimming yet but is walking onthe treadmill at 4 mph and therapist instructed pt today to make sure to update doctor of this so they can give her vleasreance for that and when ok to go swimming again as pt is very eager to return to this. Her final reconstruction is planned for mid February and pt will continue to work on her ROM and overall flexibility until then. Pt was also educated on lymphedema risk reduction in regards to exercising and she has a compression sleeve that she has been and will continue to wear even though she is at a very low risk.   Pt will benefit from skilled therapeutic intervention  in order to improve on the following deficits Decreased range of motion;Decreased knowledge of precautions;Impaired UE functional use   Rehab Potential Excellent   Clinical Impairments Affecting Rehab Potential one drain still in place on each side for double mastectomy   PT Treatment/Interventions Patient/family education   PT Next Visit Plan Dishcarge this visit.    PT Home Exercise Plan Pt hopes to gain clearance from Dr. Iran Planas today to resume swimming and has been  walking on a treadmill for 2 weeks now but cautioned against highly repetittive use of UE's.   Consulted and Agree with Plan of Care Patient        Problem List Patient Active Problem List   Diagnosis Date Noted  . DCIS (ductal carcinoma in situ) 11/01/2014  . Genetic testing 10/31/2014  . Ductal carcinoma in situ (DCIS) of left breast 10/24/2014  . Travel advice encounter 09/29/2014  . Breast cancer ([place]) 09/18/2014  . GERD 05/29/2009  . DIARRHEA 05/29/2009    Otelia Limes, PTA 12/25/2014, 12:32 PM  [place], [place], [address] Phone: [phone]   Fax:  [phone]  Name: Angie Jennings MRN: [id number] Date of Birth: 15-Mar-1950    PHYSICAL THERAPY DISCHARGE SUMMARY  Visits from Start of Care: 2  Current functional level related to goals / functional outcomes: Two of three goals met and the third partially met as noted above.   Remaining deficits: Shoulder ROM remains limited as noted above.   Education / Equipment: HEP Plan: Patient agrees to discharge.  Patient goals were partially met. Patient is being discharged due to being pleased with the current functional level.  ?????       Serafina Royals, PT 12/25/2014 12:40 PM

## 2014-12-28 NOTE — Progress Notes (Signed)
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 ER negative, PR negative Abnormal screening mammogram of the left breast on 09/14/2013 Biopsy of the left breast on 09/26/2013 showing benign breast tissue, negative for atypia or malignancy, calcifications identified R prophylactic nipple sparing mastectomy, L nipple sparing mastectomy with L axillary sentinel node biopsy 11/01/2014 Dr. Donne Hazel Bilateral breast reconstruction with placement of tissue expanders 11/01/2014 Dr. Iran Planas  HISTORY OF PRESENTING ILLNESS:  Angie Jennings 64 y.o. 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 She has undergone bilateral mastectomies with Dr. Donne Hazel. She is undergoing reconstruction and has expanders in place.  Her mood is fairly good. She notes that she expects at "some point it will all hit her." She has no major complaints today.  She wants to know if she can use vaginal estrogen 3x/week. She reports recurrent UTI's without using it.  MEDICAL HISTORY:  Past Medical History  Diagnosis Date  . GERD (gastroesophageal reflux disease)   . Anxiety   . Childhood asthma   . Radiation     as child for treatment of keloids on face and neck  . 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  Esophagogastroduodenoscopy  04/20/09    mild gastritis  . Esophagogastroduodenoscopy  07/27/09    small internal hemorrhoids/rare sigmoid colon diverticula other wise no polyps  . Bravo capsule endoscopy  05/02/09    GERD with adequate acid suppression  . Abdominal hysterectomy  2006  . Esophagogastroduodenoscopy  11/21/2011    Procedure: ESOPHAGOGASTRODUODENOSCOPY (EGD);  Surgeon: Danie Binder, MD;  [facility];  Service: Endoscopy;  Laterality: N/A;  2:00  . Bravo ph study  11/21/2011    Procedure: BRAVO [place];  Surgeon: Danie Binder, MD;  Location: [facility];  Service: Endoscopy;  Laterality: N/A;  . Breast surgery  AUGUST 2015    BREAST BIOPSY   . Colonoscopy  11/21/2011  . Breast biopsy  10/05/14  . Back surgery  1995    fusion of L4 & L5  . Back surgery      lumbar fusion  . Nipple sparing mastectomy/sentinal lymph node biopsy/reconstruction/placement of tissue expander Bilateral 11/01/2014    Procedure: LEFT NIPPLE SPARING MASTECTOMY WITH LEFT  SENTINAL LYMPH NODE BIOPSY AND RIGHT PROPYLACTIC NIPPLE SPARING MASTECTOMY;  Surgeon: Rolm Bookbinder, MD;  Location: [place];  Service: General;  Laterality: Bilateral;  . Breast reconstruction with placement of tissue expander and flex hd (acellular hydrated dermis) Bilateral 11/01/2014    Procedure: BILATERAL BREAST RECONSTRUCTION WITH PLACEMENT OF TISSUE EXPANDER AND FLEX HD (ACELLULAR HYDRATED DERMIS);  Surgeon: Irene Limbo, MD;  Location: [place];  Service: Plastics;  Laterality: Bilateral;    SOCIAL HISTORY: Social History   Social History  . Marital Status: Married    Spouse Name: N/A  . Number of Children: 3  . Years of Education: N/A  Occupational History  . Not on file.   Social History Main Topics  . Smoking status: Never Smoker   . Smokeless tobacco: Never Used  . Alcohol Use: Yes     Comment: social  . Drug Use: No  . Sexual Activity: Not on file   Other  Topics Concern  . Not on file   Social History Narrative  Married 40 years 3 children, all breast fed for 4-5 months.  First child at 43 Surgical menopause, 36, still with regular cycles.  She began her menstrual cycle at age 47, irregular, took birth control from ages [age] 3 grandchildren Non-smoker ETOH, none.  History of alcoholism in her family. Hobbies include Scientist, forensic, needlework, gardening of both flowers and vegetables  FAMILY HISTORY: Family History  Problem Relation Age of Onset  . Dementia Mother   . Esophageal cancer Mother 53  . Hypertension Father   . Lung cancer Father 35    smoker  . Prostate cancer Brother 42  . Dementia Maternal Aunt   . Bladder Cancer Maternal Uncle 20    former smoker  . Heart attack Maternal Grandmother   . Dementia Maternal Grandfather   . Esophageal cancer Maternal Aunt 72    non-smoker  . Brain cancer Maternal Uncle 63    Meningioma - benign  . Esophageal cancer Maternal Uncle 61   indicated that her mother is deceased. She indicated that her father is deceased. She indicated that her brother is alive. She indicated that her maternal grandmother is deceased. She indicated that her maternal grandfather is deceased. She indicated that both of her maternal aunts are deceased. She indicated that all of her three maternal uncles are deceased.   Father adopted, Deceased at 30, lung  He was a smoker Esophageal cancer 2 of her sisters 1 Brother, prostate cancer, 70, diagnosed at 48, cured with prostatectomy 1st cousin on maternal side who died from breast cancer at age 31.  Originally diagnosed in her late 54's.    ALLERGIES:  is allergic to clarithromycin; ivp dye; kapidex; lidocaine; metronidazole; and nsaids.  MEDICATIONS:  Current Outpatient Prescriptions  Medication Sig Dispense Refill  . acetaminophen (TYLENOL) 500 MG tablet Take 500 mg by mouth every 6 (six) hours as needed.    . Calcium Citrate-Vitamin D (CITRACAL  PETITES/VITAMIN D PO) Take 2 tablets by mouth daily.    . methocarbamol (ROBAXIN) 500 MG tablet Take 1 tablet (500 mg total) by mouth every 6 (six) hours as needed for muscle spasms. 40 tablet 0  . RABEprazole (ACIPHEX) 20 MG tablet TAKE ONE TABLET BY MOUTH TWICE DAILY. (Patient taking differently: TAKE ONE TABLET BY MOUTH once DAILY.) 60 tablet 3  . venlafaxine (EFFEXOR) 37.5 MG tablet One tablet daily 30 tablet 11  . doxycycline (VIBRA-TABS) 100 MG tablet Take 1 tablet (100 mg total) by mouth daily. (Patient not taking: Reported on 12/25/2014) 37 tablet 0  . oxyCODONE (OXY IR/ROXICODONE) 5 MG immediate release tablet Take 1-2 tablets (5-10 mg total) by mouth every 4 (four) hours as needed for moderate pain. (Patient not taking: Reported on 11/27/2014) 50 tablet 0   No current facility-administered medications for this visit.    Review of Systems  Constitutional: Negative.   HENT: Negative.   Eyes: Negative.   Respiratory: Negative.   Cardiovascular: Negative.   Gastrointestinal: Negative.   Genitourinary: Negative.   Musculoskeletal: Negative.   Skin: Negative.   Neurological: Negative.   Endo/Heme/Allergies: Negative.   Psychiatric/Behavioral: Negative for depression, suicidal  ideas, hallucinations, memory loss and substance abuse. The patient is nervous/anxious. The patient does not have insomnia.   All other systems reviewed and are negative.  14 point ROS was done and is otherwise as detailed above or in HPI  PHYSICAL EXAMINATION: ECOG PERFORMANCE STATUS: 0 - Asymptomatic  Filed Vitals:   12/29/14 0927  BP: 126/74  Pulse: 89  Temp: 98.9 F (37.2 C)  Resp: 18   Filed Weights   12/29/14 0927  Weight: 157 lb 9.6 oz (71.487 kg)    Physical Exam  Constitutional: She is oriented to person, place, and time and well-developed, well-nourished, and in no distress.  HENT:  Head: Normocephalic and atraumatic.  Nose: Nose normal.  Mouth/Throat: Oropharynx is clear and moist.  No oropharyngeal exudate.  Eyes: Conjunctivae and EOM are normal. Pupils are equal, round, and reactive to light. Right eye exhibits no discharge. Left eye exhibits no discharge. No scleral icterus.  Neck: Normal range of motion. Neck supple. No tracheal deviation present. No thyromegaly present.  Cardiovascular: Normal rate, regular rhythm and normal heart sounds.  Exam reveals no gallop and no friction rub.   No murmur heard. Pulmonary/Chest: Effort normal and breath sounds normal. She has no wheezes. She has no rales.    Abdominal: Soft. Bowel sounds are normal. She exhibits no distension and no mass. There is no tenderness. There is no rebound and no guarding.  Musculoskeletal: Normal range of motion. She exhibits no edema.  Lymphadenopathy:    She has no cervical adenopathy.  Neurological: She is alert and oriented to person, place, and time. She has normal reflexes. No cranial nerve deficit. Gait normal. Coordination normal.  Skin: Skin is warm and dry. No rash noted.  Psychiatric: Mood, memory, affect and judgment normal.  Nursing note and vitals reviewed.   LABORATORY DATA:  I have reviewed the data as listed Lab Results  Component Value Date   WBC 6.9 10/27/2014   HGB 14.8 10/27/2014   HCT 44.3 10/27/2014   MCV 89.3 10/27/2014   PLT 215 10/27/2014   CMP     Component Value Date/Time   NA 137 10/27/2014 1145   K 4.2 10/27/2014 1145   CL 102 10/27/2014 1145   CO2 28 10/27/2014 1145   GLUCOSE 99 10/27/2014 1145   BUN 12 10/27/2014 1145   CREATININE 0.81 10/27/2014 1145   CALCIUM 9.2 10/27/2014 1145   PROT 6.2 04/20/2009 0500   ALBUMIN 3.7 04/20/2009 0500   AST 40* 04/20/2009 0500   ALT 31 04/20/2009 0500   ALKPHOS 108 04/20/2009 0500   BILITOT 0.8 04/20/2009 0500   GFRNONAA >60 10/27/2014 1145   GFRAA >60 10/27/2014 1145    RADIOGRAPHIC STUDIES: I have personally reviewed the radiological images as listed and agreed with the findings in the report.  CLINICAL  DATA: Screening.  EXAM: DIGITAL SCREENING BILATERAL MAMMOGRAM WITH 3D TOMO WITH CAD  COMPARISON: Previous exam(s).  ACR Breast Density Category c: The breast tissue is heterogeneously dense, which may obscure small masses.  FINDINGS: In the left breast, calcifications warrant further evaluation. In the right breast, no findings suspicious for malignancy. Images were processed with CAD.  IMPRESSION: Further evaluation is suggested for calcifications in the left breast.  RECOMMENDATION: Diagnostic mammogram of the left breast. (Code:[id number]-L-18M)  The patient will be contacted regarding the findings, and additional imaging will be scheduled.  BI-RADS CATEGORY 0: Incomplete. Need additional imaging evaluation and/or prior mammograms for comparison.   Electronically Signed  By: Lily Lovings.D.  On: 09/29/2014 08:03   ASSESSMENT & PLAN:  ER- PR- DCIS L breast Osteopenia DEXA 12/2013 FamHX prostate cancer in brother at age 50 Anxiety about diagnosis Bilateral mastectomies with reconstruction  She is realistically doing well. She is going to rotate her visits between myself and Dr. Donne Hazel. She anticipates followup with him after she completes reconstruction. She does not plan on moving forward with her second surgery in February.  I discussed the overall thoughts of vaginal estrogen use in breast cancer patients. Her major complaint is recurrent/frequent UTI's and not necessarily vaginal dryness. Typically in patients with ER+ breast cancer we can use vaginal estrogen (it has been shown to have minimal systemic absorption but does have some) but those patients are typically on tamoxifen. Her DCIS was ER-.   From up to date: There are few data regarding the safety of vaginal estrogen therapy in women with breast cancer. No increase in the risk of recurrence was found in a prospective cohort study of 5 women with breast cancer included 3 women treated with  vaginal estrogen for an average of one year (range 0.1 to 5 years). The study did not report recurrence rates by stage or hormone receptor status  I feel it is reasonable for her to use low dose therapy but I advised her to use it only 3 to 4 times weekly.  She was encouraged to continue on calcium and vitamin D daily and with weight bearing exercise.  We again discussed the emotional impact of her diagnosis and surgeries.  All questions were answered. The patient knows to call the clinic with any problems, questions or concerns.   This note was electronically signed.  Kelby Fam. Whitney Muse, MD

## 2014-12-29 ENCOUNTER — Encounter (HOSPITAL_COMMUNITY): Payer: Self-pay | Admitting: Hematology & Oncology

## 2014-12-29 ENCOUNTER — Encounter (HOSPITAL_COMMUNITY): Payer: BC Managed Care – PPO | Attending: Hematology & Oncology | Admitting: Hematology & Oncology

## 2014-12-29 VITALS — BP 126/74 | HR 89 | Temp 98.9°F | Resp 18 | Wt 157.6 lb

## 2014-12-29 DIAGNOSIS — M8580 Other specified disorders of bone density and structure, unspecified site: Secondary | ICD-10-CM | POA: Diagnosis not present

## 2014-12-29 DIAGNOSIS — D0512 Intraductal carcinoma in situ of left breast: Secondary | ICD-10-CM

## 2014-12-29 DIAGNOSIS — Z171 Estrogen receptor negative status [ER-]: Secondary | ICD-10-CM

## 2014-12-29 DIAGNOSIS — N390 Urinary tract infection, site not specified: Secondary | ICD-10-CM | POA: Diagnosis not present

## 2014-12-29 DIAGNOSIS — F419 Anxiety disorder, unspecified: Secondary | ICD-10-CM

## 2014-12-29 DIAGNOSIS — Z9889 Other specified postprocedural states: Secondary | ICD-10-CM

## 2014-12-29 NOTE — Patient Instructions (Signed)
[facility] at [facility] Discharge Instructions  RECOMMENDATIONS MADE BY THE CONSULTANT AND ANY TEST RESULTS WILL BE SENT TO YOUR REFERRING PHYSICIAN.  Exam and discussion by Dr. Whitney Muse OK to use the vaginal estrogen.  If you want [place] to check your estradiol level call us. Report any new lumps, bone pain, shortness of breath or other symptoms.  Follow-up in October.  Thank you for choosing [facility] at [facility] to provide your oncology and hematology care.  To afford each patient quality time with our provider, please arrive at least 15 minutes before your scheduled appointment time.    You need to re-schedule your appointment should you arrive 10 or more minutes late.  We strive to give you quality time with our providers, and arriving late affects you and other patients whose appointments are after yours.  Also, if you no show three or more times for appointments you may be dismissed from the clinic at the providers discretion.     Again, thank you for choosing [facility].  Our hope is that these requests will decrease the amount of time that you wait before being seen by our physicians.       _____________________________________________________________  Should you have questions after your visit to [facility], please contact our office at [phone] between the hours of 8:30 a.m. and 4:30 p.m.  Voicemails left after 4:30 p.m. will not be returned until the following business day.  For prescription refill requests, have your pharmacy contact our office.

## 2015-01-15 ENCOUNTER — Other Ambulatory Visit: Payer: Self-pay | Admitting: Gynecology

## 2015-02-23 ENCOUNTER — Ambulatory Visit (INDEPENDENT_AMBULATORY_CARE_PROVIDER_SITE_OTHER): Payer: BC Managed Care – PPO | Admitting: Gynecology

## 2015-02-23 ENCOUNTER — Encounter: Payer: Self-pay | Admitting: Gynecology

## 2015-02-23 VITALS — BP 138/80 | Ht 67.0 in | Wt 159.0 lb

## 2015-02-23 DIAGNOSIS — Z853 Personal history of malignant neoplasm of breast: Secondary | ICD-10-CM

## 2015-02-23 DIAGNOSIS — Z01419 Encounter for gynecological examination (general) (routine) without abnormal findings: Secondary | ICD-10-CM

## 2015-02-23 DIAGNOSIS — Z23 Encounter for immunization: Secondary | ICD-10-CM | POA: Diagnosis not present

## 2015-02-23 DIAGNOSIS — N952 Postmenopausal atrophic vaginitis: Secondary | ICD-10-CM | POA: Diagnosis not present

## 2015-02-23 MED ORDER — NONFORMULARY OR COMPOUNDED ITEM
Status: DC
Start: 2015-02-23 — End: 2015-07-13

## 2015-02-23 NOTE — Progress Notes (Signed)
Angie Jennings 03-10-1950 [id number]   History:    65 y.o.  for annual gyn exam with the only complaining of vaginal dryness and irritation which in the past she did use vaginal estrogen twice a week which at health. Patient has been followed by the medical oncologist as well as the general surgeon because of her history of ductal carcinoma in situ. Her history as described by her medical oncologist Dr. 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 ER negative, PR negative Abnormal screening mammogram of the left breast on 09/14/2013 Biopsy of the left breast on 09/26/2013 showing benign breast tissue, negative for atypia or malignancy, calcifications identified R prophylactic nipple sparing mastectomy, L nipple sparing mastectomy with L axillary sentinel node biopsy 11/01/2014 Dr. Donne Hazel Bilateral breast reconstruction with placement of tissue expanders 11/01/2014 Dr. Iran Planas  Patient stated she had discussed with her medical oncologist that she's had no problem with her using vaginal estrogen twice a week. Patient did not require any chemoradiation therapy. Patient's last bone density study in 2015 demonstrated the lowest T score was -1.8 at the right femoral neck with a normal Frax analysis. Patient had a colonoscopy in 2013 which was negative and she is on a 10 year recall. Patient does have family history of esophageal cancer who are smokers and every 5 years is having an EGD. Patient prior to her hysterectomy did not have any history of any abnormal Pap smears. Patient had a total abdominal hysterectomy in 2006   Past medical history,surgical history, family history and social history were all reviewed and documented in the EPIC chart.  Gynecologic History No LMP recorded. Patient has had a hysterectomy. Contraception: status post  hysterectomy Last Pap: 2015. Results were: normal Last mammogram: 2016. Results were: See above  Obstetric History OB History  Gravida Para Term Preterm AB SAB TAB Ectopic Multiple Living  5 3   2 2    3     # Outcome Date GA Lbr Len/2nd Weight Sex Delivery Anes PTL Lv  5 SAB           4 SAB           3 Para           2 Para           1 Para                ROS: A ROS was performed and pertinent positives and negatives are included in the history.  GENERAL: No fevers or chills. HEENT: No change in vision, no earache, sore throat or sinus congestion. NECK: No pain or stiffness. CARDIOVASCULAR: No chest pain or pressure. No palpitations. PULMONARY: No shortness of breath, cough or wheeze. GASTROINTESTINAL: No abdominal pain, nausea, vomiting or diarrhea, melena or bright red blood per rectum. GENITOURINARY: No urinary frequency, urgency, hesitancy or dysuria. MUSCULOSKELETAL: No joint or muscle pain, no back pain, no recent trauma. DERMATOLOGIC: No rash, no itching, no lesions. ENDOCRINE: No polyuria, polydipsia, no heat or cold intolerance. No recent change in weight. HEMATOLOGICAL: No anemia or easy bruising or bleeding. NEUROLOGIC: No headache, seizures, numbness, tingling or weakness. PSYCHIATRIC: No depression, no loss of interest in normal activity or change in sleep pattern.     Exam: chaperone present  BP 138/80 mmHg  Ht 5\' 7"  (1.702 m)  Wt 159 lb (72.122 kg)  BMI 24.90  kg/m2  Body mass index is 24.9 kg/(m^2).  General appearance : Well developed well nourished female. No acute distress HEENT: Eyes: no retinal hemorrhage or exudates,  Neck supple, trachea midline, no carotid bruits, no thyroidmegaly Lungs: Clear to auscultation, no rhonchi or wheezes, or rib retractions  Heart: Regular rate and rhythm, no murmurs or gallops Breast:Examined in sitting and supine position were symmetrical in appearance, no palpable masses or tenderness,  no skin retraction, no nipple inversion,  no nipple discharge, no skin discoloration, no axillary or supraclavicular lymphadenopathy Abdomen: no palpable masses or tenderness, no rebound or guarding Extremities: no edema or skin discoloration or tenderness  Pelvic:  Bartholin, Urethra, Skene Glands: Within normal limits             Vagina: No gross lesions or discharge  Cervix: Absent  Uterus  absent  Adnexa  Without masses or tenderness  Anus and perineum  normal   Rectovaginal  normal sphincter tone without palpated masses or tenderness             Hemoccult cards provided   Patient received flu vaccine today.  Assessment/Plan:  65 y.o. female for annual exam patient's PCP has been doing her blood work. Pap smear not indicated. Prescription for estradiol vaginal to apply twice a week for vaginal atrophy was provided. Patient received the flu vaccine today. Patient due for bone density study at the end of the year. We discussed importance of calcium vitamin D and regular exercise for osteoporosis prevention.   Terrance Mass MD, 3:29 PM 02/23/2015

## 2015-02-27 ENCOUNTER — Other Ambulatory Visit: Payer: Self-pay | Admitting: Gynecology

## 2015-03-07 ENCOUNTER — Encounter (HOSPITAL_BASED_OUTPATIENT_CLINIC_OR_DEPARTMENT_OTHER): Payer: Self-pay | Admitting: *Deleted

## 2015-03-07 NOTE — H&P (Signed)
  Subjective:    Patient ID: Angie Jennings is a 65 y.o. female.  HPI  4 months post op NSM and TE reconstruction.   Presented following diagnostic left MMG with suspicious left breast calcifications, 2.7 cm span. Biopsy with high-grade DCIS with calcification, ER -/, PR -. MRI Left breast with NME involving the lower outer quadrant, posterior and medial to the biopsy site, spanning 4.3 x 3.0 x 2.0 cm. Final pathology with DCIS left breast, negative SLN, right breast benign. Genetics panel negative.  Prior 34/50F, desires "C" cup. Right mastectomy 537 g Left mastectomy 533 g  Review of Systems Significant for mole removal at 65 years of age, and had radiation treatment to right neck and face for keloid scar formation. Her radiation dosage was high enough to warrant getting thyroid testing annually. Not shielded at the time.    Objective:   Physical Exam  Cardiovascular: Normal rate, regular rhythm and normal heart sounds.  Pulmonary/Chest: Effort normal and breath sounds normal.    Chest soft, Incisions intact, skin gathered over right lower pole; bilateral expanders lying above IMF, greater on right SN to nipple R 24.5 L 25 cm BW R 13 L 13 cm Nipple to IMF R 7 L 7 cm    Assessment:     Left breast DCIS s/p bilateral NSM with TE/ADM reconstruction    Plan:     Patient has elected for round textured round silicone. Plan lipofilling to aid with contour and add soft tissue to mastectomy flaps. Reviewed abdominal compression for 4-6 weeks, variable take graft and need for repeat of this procedure.   Reviewed risks including but not limited to bleeding, hematoma, seroma, damage to deeper structures, infection requiring surgery or removal implants, contracture, rupture, rotation, rippling, DVT/PE, cardiopulmonary complications, asymmetry contour liposuction site, damage to deeper structures.   Mentor Artoura High Profile 375 ml tissue expanders placed  bilaterally. Right fill volume 395 ml Left fill volume 395 ml  Irene Limbo, MD [facility] [phone]

## 2015-03-13 ENCOUNTER — Ambulatory Visit (HOSPITAL_BASED_OUTPATIENT_CLINIC_OR_DEPARTMENT_OTHER): Payer: BC Managed Care – PPO | Admitting: Anesthesiology

## 2015-03-13 ENCOUNTER — Encounter (HOSPITAL_BASED_OUTPATIENT_CLINIC_OR_DEPARTMENT_OTHER): Payer: Self-pay | Admitting: Anesthesiology

## 2015-03-13 ENCOUNTER — Ambulatory Visit (HOSPITAL_BASED_OUTPATIENT_CLINIC_OR_DEPARTMENT_OTHER)
Admission: RE | Admit: 2015-03-13 | Discharge: 2015-03-13 | Disposition: A | Discharge status: Home / Self Care | Payer: BC Managed Care – PPO | Source: Ambulatory Visit | Attending: Plastic Surgery | Admitting: Plastic Surgery

## 2015-03-13 ENCOUNTER — Encounter (HOSPITAL_BASED_OUTPATIENT_CLINIC_OR_DEPARTMENT_OTHER)
Admission: RE | Disposition: A | Discharge status: Home / Self Care | Payer: Self-pay | Source: Ambulatory Visit | Attending: Plastic Surgery

## 2015-03-13 DIAGNOSIS — Z9013 Acquired absence of bilateral breasts and nipples: Secondary | ICD-10-CM | POA: Diagnosis present

## 2015-03-13 DIAGNOSIS — Z853 Personal history of malignant neoplasm of breast: Secondary | ICD-10-CM | POA: Insufficient documentation

## 2015-03-13 DIAGNOSIS — K219 Gastro-esophageal reflux disease without esophagitis: Secondary | ICD-10-CM | POA: Diagnosis not present

## 2015-03-13 HISTORY — PX: LIPOSUCTION WITH LIPOFILLING: SHX6436

## 2015-03-13 HISTORY — DX: Unspecified osteoarthritis, unspecified site: M19.90

## 2015-03-13 HISTORY — PX: REMOVAL OF BILATERAL TISSUE EXPANDERS WITH PLACEMENT OF BILATERAL BREAST IMPLANTS: SHX6431

## 2015-03-13 SURGERY — REMOVAL, TISSUE EXPANDER, BREAST, BILATERAL, WITH BILATERAL IMPLANT IMPLANT INSERTION
Anesthesia: General | Site: Breast | Laterality: Bilateral

## 2015-03-13 MED ORDER — FENTANYL CITRATE (PF) 100 MCG/2ML IJ SOLN
INTRAMUSCULAR | Status: AC
  Filled 2015-03-13: qty 2

## 2015-03-13 MED ORDER — LIDOCAINE HCL (CARDIAC) 20 MG/ML IV SOLN
INTRAVENOUS | Status: DC | PRN
  Administered 2015-03-13: 50 mg via INTRAVENOUS

## 2015-03-13 MED ORDER — FENTANYL CITRATE (PF) 100 MCG/2ML IJ SOLN: 25.0000 ug | INTRAMUSCULAR | Status: DC | PRN

## 2015-03-13 MED ORDER — OXYCODONE HCL 5 MG PO TABS
ORAL_TABLET | ORAL | Status: AC
  Filled 2015-03-13: qty 1

## 2015-03-13 MED ORDER — PHENYLEPHRINE 40 MCG/ML (10ML) SYRINGE FOR IV PUSH (FOR BLOOD PRESSURE SUPPORT)
PREFILLED_SYRINGE | INTRAVENOUS | Status: AC
  Filled 2015-03-13: qty 10

## 2015-03-13 MED ORDER — SCOPOLAMINE 1 MG/3DAYS TD PT72: 1.0000 | MEDICATED_PATCH | Freq: Once | TRANSDERMAL | Status: DC

## 2015-03-13 MED ORDER — SODIUM CHLORIDE 0.9 % IV SOLN
INTRAVENOUS | Status: DC | PRN
  Administered 2015-03-13: 500 mL

## 2015-03-13 MED ORDER — CEFAZOLIN SODIUM-DEXTROSE 2-3 GM-% IV SOLR
INTRAVENOUS | Status: AC
  Filled 2015-03-13: qty 50

## 2015-03-13 MED ORDER — ONDANSETRON HCL 4 MG/2ML IJ SOLN
INTRAMUSCULAR | Status: AC
  Filled 2015-03-13: qty 2

## 2015-03-13 MED ORDER — FENTANYL CITRATE (PF) 100 MCG/2ML IJ SOLN
50.0000 ug | INTRAMUSCULAR | Status: AC | PRN
Start: 2015-03-13 — End: 2015-03-13
  Administered 2015-03-13 (×2): 50 ug via INTRAVENOUS
  Administered 2015-03-13: 100 ug via INTRAVENOUS
  Administered 2015-03-13: 50 ug via INTRAVENOUS

## 2015-03-13 MED ORDER — PROPOFOL 10 MG/ML IV BOLUS
INTRAVENOUS | Status: DC | PRN
  Administered 2015-03-13: 200 mg via INTRAVENOUS

## 2015-03-13 MED ORDER — ACETAMINOPHEN 325 MG PO TABS
650.0000 mg | ORAL_TABLET | Freq: Once | ORAL | Status: AC
  Administered 2015-03-13: 650 mg via ORAL

## 2015-03-13 MED ORDER — MIDAZOLAM HCL 2 MG/2ML IJ SOLN
INTRAMUSCULAR | Status: AC
  Filled 2015-03-13: qty 2

## 2015-03-13 MED ORDER — SODIUM BICARBONATE 4 % IV SOLN
INTRAVENOUS | Status: DC | PRN
  Administered 2015-03-13: 10 mL via INTRAVENOUS

## 2015-03-13 MED ORDER — EPINEPHRINE HCL 1 MG/ML IJ SOLN
INTRAMUSCULAR | Status: AC
  Filled 2015-03-13: qty 1

## 2015-03-13 MED ORDER — PROMETHAZINE HCL 25 MG/ML IJ SOLN: 6.2500 mg | INTRAMUSCULAR | Status: DC | PRN

## 2015-03-13 MED ORDER — ACETAMINOPHEN 325 MG PO TABS
ORAL_TABLET | ORAL | Status: AC
  Filled 2015-03-13: qty 2

## 2015-03-13 MED ORDER — DEXAMETHASONE SODIUM PHOSPHATE 10 MG/ML IJ SOLN
INTRAMUSCULAR | Status: AC
  Filled 2015-03-13: qty 1

## 2015-03-13 MED ORDER — LACTATED RINGERS IV SOLN
INTRAVENOUS | Status: DC
  Administered 2015-03-13: 07:00:00 via INTRAVENOUS

## 2015-03-13 MED ORDER — PROPOFOL 500 MG/50ML IV EMUL
INTRAVENOUS | Status: AC
  Filled 2015-03-13: qty 50

## 2015-03-13 MED ORDER — SUCCINYLCHOLINE CHLORIDE 20 MG/ML IJ SOLN
INTRAMUSCULAR | Status: DC | PRN
  Administered 2015-03-13: 100 mg via INTRAVENOUS

## 2015-03-13 MED ORDER — CEFAZOLIN SODIUM-DEXTROSE 2-3 GM-% IV SOLR
2.0000 g | INTRAVENOUS | Status: AC
  Administered 2015-03-13: 2 g via INTRAVENOUS

## 2015-03-13 MED ORDER — LIDOCAINE HCL (PF) 1 % IJ SOLN
INTRAMUSCULAR | Status: AC
  Filled 2015-03-13: qty 60

## 2015-03-13 MED ORDER — MIDAZOLAM HCL 2 MG/2ML IJ SOLN
1.0000 mg | INTRAMUSCULAR | Status: DC | PRN
  Administered 2015-03-13: 2 mg via INTRAVENOUS

## 2015-03-13 MED ORDER — BUPIVACAINE HCL (PF) 0.5 % IJ SOLN
INTRAMUSCULAR | Status: AC
  Filled 2015-03-13: qty 60

## 2015-03-13 MED ORDER — LIDOCAINE HCL (CARDIAC) 20 MG/ML IV SOLN
INTRAVENOUS | Status: AC
  Filled 2015-03-13: qty 5

## 2015-03-13 MED ORDER — BUPIVACAINE HCL (PF) 0.5 % IJ SOLN
INTRAMUSCULAR | Status: DC | PRN
  Administered 2015-03-13: 50 mL

## 2015-03-13 MED ORDER — SODIUM BICARBONATE 4 % IV SOLN
INTRAVENOUS | Status: AC
  Filled 2015-03-13: qty 10

## 2015-03-13 MED ORDER — GLYCOPYRROLATE 0.2 MG/ML IJ SOLN: 0.2000 mg | Freq: Once | INTRAMUSCULAR | Status: DC | PRN

## 2015-03-13 MED ORDER — CHLORHEXIDINE GLUCONATE 4 % EX LIQD: 1.0000 "application " | Freq: Once | CUTANEOUS | Status: DC

## 2015-03-13 MED ORDER — LACTATED RINGERS IV SOLN
INTRAVENOUS | Status: DC | PRN
  Administered 2015-03-13: 500 mL via INTRAVENOUS

## 2015-03-13 MED ORDER — EPINEPHRINE HCL 1 MG/ML IJ SOLN
INTRAMUSCULAR | Status: DC | PRN
Start: 2015-03-13 — End: 2015-03-13
  Administered 2015-03-13: 1 mg

## 2015-03-13 MED ORDER — DEXAMETHASONE SODIUM PHOSPHATE 4 MG/ML IJ SOLN
INTRAMUSCULAR | Status: DC | PRN
  Administered 2015-03-13: 10 mg via INTRAVENOUS

## 2015-03-13 MED ORDER — SULFAMETHOXAZOLE-TRIMETHOPRIM 800-160 MG PO TABS
1.0000 | ORAL_TABLET | Freq: Two times a day (BID) | ORAL | Status: DC
Start: 2015-03-13 — End: 2015-07-13

## 2015-03-13 MED ORDER — SUCCINYLCHOLINE CHLORIDE 20 MG/ML IJ SOLN
INTRAMUSCULAR | Status: AC
  Filled 2015-03-13: qty 1

## 2015-03-13 MED ORDER — METHOCARBAMOL 500 MG PO TABS: 500.0000 mg | ORAL_TABLET | Freq: Three times a day (TID) | ORAL | Status: DC | PRN

## 2015-03-13 MED ORDER — PHENYLEPHRINE HCL 10 MG/ML IJ SOLN
INTRAMUSCULAR | Status: DC | PRN
  Administered 2015-03-13: 40 ug via INTRAVENOUS

## 2015-03-13 MED ORDER — OXYCODONE HCL 5 MG PO TABS
5.0000 mg | ORAL_TABLET | Freq: Once | ORAL | Status: AC | PRN
  Administered 2015-03-13: 5 mg via ORAL

## 2015-03-13 SURGICAL SUPPLY — 95 items
BAG DECANTER FOR FLEXI CONT (MISCELLANEOUS) ×4 IMPLANT
BINDER ABDOMINAL 10 UNV 27-48 (MISCELLANEOUS) ×2 IMPLANT
BINDER ABDOMINAL 12 SM 30-45 (SOFTGOODS) IMPLANT
BINDER BREAST LRG (GAUZE/BANDAGES/DRESSINGS) IMPLANT
BINDER BREAST MEDIUM (GAUZE/BANDAGES/DRESSINGS) IMPLANT
BINDER BREAST XLRG (GAUZE/BANDAGES/DRESSINGS) ×2 IMPLANT
BINDER BREAST XXLRG (GAUZE/BANDAGES/DRESSINGS) IMPLANT
BLADE SURG 10 STRL SS (BLADE) ×4 IMPLANT
BLADE SURG 11 STRL SS (BLADE) ×4 IMPLANT
BLADE SURG 15 STRL LF DISP TIS (BLADE) ×2 IMPLANT
BLADE SURG 15 STRL SS (BLADE) ×4
BNDG GAUZE ELAST 4 BULKY (GAUZE/BANDAGES/DRESSINGS) ×8 IMPLANT
CANISTER LIPO FAT HARVEST (MISCELLANEOUS) ×4 IMPLANT
CANISTER SUCT 1200ML W/VALVE (MISCELLANEOUS) ×8 IMPLANT
CHLORAPREP W/TINT 26ML (MISCELLANEOUS) ×4 IMPLANT
COMPRESSION GARMENT LG MOREWEL (MISCELLANEOUS) IMPLANT
COMPRESSION GARMENT MD MOREWEL (MISCELLANEOUS) IMPLANT
COMPRESSION GARMENT XL MOREWEL (MISCELLANEOUS) IMPLANT
COVER BACK TABLE 60X90IN (DRAPES) ×4 IMPLANT
COVER MAYO STAND STRL (DRAPES) ×4 IMPLANT
DECANTER SPIKE VIAL GLASS SM (MISCELLANEOUS) IMPLANT
DRAIN CHANNEL 15F RND FF W/TCR (WOUND CARE) ×6 IMPLANT
DRAPE LAPAROSCOPIC ABDOMINAL (DRAPES) ×4 IMPLANT
DRAPE U 20/CS (DRAPES) IMPLANT
DRSG PAD ABDOMINAL 8X10 ST (GAUZE/BANDAGES/DRESSINGS) ×12 IMPLANT
ELECT BLADE 4.0 EZ CLEAN MEGAD (MISCELLANEOUS) ×4
ELECT COATED BLADE 2.86 ST (ELECTRODE) ×4 IMPLANT
ELECT REM PT RETURN 9FT ADLT (ELECTROSURGICAL) ×4
ELECTRODE BLDE 4.0 EZ CLN MEGD (MISCELLANEOUS) ×2 IMPLANT
ELECTRODE REM PT RTRN 9FT ADLT (ELECTROSURGICAL) ×2 IMPLANT
EVACUATOR SILICONE 100CC (DRAIN) ×6 IMPLANT
FILTER LIPOSUCTION (MISCELLANEOUS) ×4 IMPLANT
GLOVE BIO SURGEON STRL SZ 6 (GLOVE) ×4 IMPLANT
GLOVE BIOGEL PI IND STRL 6.5 (GLOVE) IMPLANT
GLOVE BIOGEL PI IND STRL 7.0 (GLOVE) IMPLANT
GLOVE BIOGEL PI IND STRL 7.5 (GLOVE) IMPLANT
GLOVE BIOGEL PI INDICATOR 6.5 (GLOVE) ×2
GLOVE BIOGEL PI INDICATOR 7.0 (GLOVE) ×8
GLOVE BIOGEL PI INDICATOR 7.5 (GLOVE) ×4
GLOVE ECLIPSE 6.5 STRL STRAW (GLOVE) ×4 IMPLANT
GOWN STRL REUS W/ TWL LRG LVL3 (GOWN DISPOSABLE) ×4 IMPLANT
GOWN STRL REUS W/TWL LRG LVL3 (GOWN DISPOSABLE) ×8
IMPLANT BREAST SILICONE 500CC (Breast) ×2 IMPLANT
IMPLANT SILICONE HP TEXT 475CC ×2 IMPLANT
IV NS 500ML (IV SOLUTION)
IV NS 500ML BAXH (IV SOLUTION) IMPLANT
KIT FILL SYSTEM UNIVERSAL (SET/KITS/TRAYS/PACK) IMPLANT
LINER CANISTER 1000CC FLEX (MISCELLANEOUS) ×4 IMPLANT
LIQUID BAND (GAUZE/BANDAGES/DRESSINGS) ×6 IMPLANT
NDL HYPO 25X1 1.5 SAFETY (NEEDLE) IMPLANT
NDL SAFETY ECLIPSE 18X1.5 (NEEDLE) ×2 IMPLANT
NEEDLE HYPO 18GX1.5 SHARP (NEEDLE) ×4
NEEDLE HYPO 25X1 1.5 SAFETY (NEEDLE) IMPLANT
NS IRRIG 1000ML POUR BTL (IV SOLUTION) ×4 IMPLANT
PACK BASIN DAY SURGERY FS (CUSTOM PROCEDURE TRAY) ×4 IMPLANT
PACK UNIVERSAL I (CUSTOM PROCEDURE TRAY) IMPLANT
PAD ALCOHOL SWAB (MISCELLANEOUS) ×4 IMPLANT
PENCIL BUTTON HOLSTER BLD 10FT (ELECTRODE) ×4 IMPLANT
PIN SAFETY STERILE (MISCELLANEOUS) ×4 IMPLANT
SHEET MEDIUM DRAPE 40X70 STRL (DRAPES) ×10 IMPLANT
SIZER BREAST 450CC (SIZER) ×4
SIZER BREAST GEL REUSE 475CC (SIZER) ×4
SIZER BREAST GENERIC (SIZER) ×4 IMPLANT
SIZER BREAST HP REUSE 500CC (SIZER) ×4
SIZER BREAST REUSE 425CC (SIZER) ×4
SIZER BRST GEL REUSE 475CC (SIZER) IMPLANT
SIZER BRST HP REUSE 500CC (SIZER) IMPLANT
SIZER BRST P5.1XHI 450CC (SIZER) IMPLANT
SIZER BRST REUSE 425CC (SIZER) IMPLANT
SIZER GENERIC MENTOR (SIZER) ×4 IMPLANT
SLEEVE SCD COMPRESS KNEE MED (MISCELLANEOUS) ×4 IMPLANT
SPONGE LAP 18X18 X RAY DECT (DISPOSABLE) ×8 IMPLANT
STAPLER VISISTAT 35W (STAPLE) ×6 IMPLANT
SUT ETHILON 2 0 FS 18 (SUTURE) ×6 IMPLANT
SUT MNCRL AB 4-0 PS2 18 (SUTURE) ×8 IMPLANT
SUT PDS 3-0 CT2 (SUTURE)
SUT PDS AB 2-0 CT2 27 (SUTURE) ×2 IMPLANT
SUT PDS II 3-0 CT2 27 ABS (SUTURE) IMPLANT
SUT VIC AB 3-0 PS1 18 (SUTURE)
SUT VIC AB 3-0 PS1 18XBRD (SUTURE) IMPLANT
SUT VIC AB 3-0 SH 27 (SUTURE) ×8
SUT VIC AB 3-0 SH 27X BRD (SUTURE) ×4 IMPLANT
SUT VICRYL 4-0 PS2 18IN ABS (SUTURE) ×8 IMPLANT
SYR 50ML LL SCALE MARK (SYRINGE) ×10 IMPLANT
SYR BULB IRRIGATION 50ML (SYRINGE) ×4 IMPLANT
SYR CONTROL 10ML LL (SYRINGE) IMPLANT
SYR TB 1ML LL NO SAFETY (SYRINGE) ×4 IMPLANT
SYRINGE 10CC LL (SYRINGE) ×12 IMPLANT
TOWEL OR 17X24 6PK STRL BLUE (TOWEL DISPOSABLE) ×8 IMPLANT
TUBE CONNECTING 20'X1/4 (TUBING) ×1
TUBE CONNECTING 20X1/4 (TUBING) ×3 IMPLANT
TUBING INFILTRATION IT-10001 (TUBING) ×4 IMPLANT
TUBING SET GRADUATE ASPIR 12FT (MISCELLANEOUS) ×6 IMPLANT
UNDERPAD 30X30 (UNDERPADS AND DIAPERS) ×8 IMPLANT
YANKAUER SUCT BULB TIP NO VENT (SUCTIONS) ×4 IMPLANT

## 2015-03-13 NOTE — Op Note (Signed)
Operative Note   DATE OF OPERATION: 1.24.17  LOCATION: [place] outpatient  SURGICAL DIVISION: Plastic Surgery  PREOPERATIVE DIAGNOSES:  1. History breast cancer 2. Acquired absence bilateral breast   POSTOPERATIVE DIAGNOSES:  same  PROCEDURE:  1. Removal bilateral tissue expanders and placement silicone implants 2. Lipofilling to bilateral chest  SURGEON: Irene Limbo MD MBA  ASSISTANT: none  ANESTHESIA:  General.   EBL: 123XX123 ml  COMPLICATIONS: None immediate.   INDICATIONS FOR PROCEDURE:  The patient, Angie Jennings, is a 65 y.o. female born on Dec 23, 1950, is here for second stage reconstruction following nipple sparing mastectomies and immediate tissue expander and acellular dermis reconstruction.    FINDINGS: 110 ml fat infiltrated over right chest, 100 ml to left chest. Mentor Siltex High Profile Round implants, RIGHT 475 ml REF [id number] SN [id number] LEFT 500 ml REF [id number]. Well incorporated acellular dermis bilateral.   DESCRIPTION OF PROCEDURE:  The patient's operative site was marked with the patient in the preoperative area to mark sternal notch, chest midline, anterior axillary lines, inframammary folds and areas of depression marked for lipofilling. Area for fat harvest from supra and infra umbilical abdomen marked.The patient was taken to the operating room. SCDs were placed and IV antibiotics were given. A time out was performed and all information was confirmed to be correct. The patient's chest and abdomen were then prepped and draped in a sterile fashion.  Incision made in right chest inframammary fold mastectomy scar. Incision carried through to capsule and expander removed. Capsulotomy performed superiorly and submuscular dissection completed toward clavicle. Additional capsultomies performed medially and inferior to incision to desired level of inframammary fold. Sizer was placed, and I then directed attention to left breast. Incision  made in prior scar and expander removed. Capsulotomies performed superiorly and submuscular dissection completed toward clavicle.Similarly capsulotomy performed at inferior extent capsule to lower fold to desired position on chest wall. Sizer placed and patient brought to upright sitting position. A Mentor Siltex High Profile Round 475 ml implant selected for right breast and Siltex High Profile Round 500 ml implant selected for left breast. Lateral chest wall on left marked for liposuction and this was completed at this time.  Patient returned to supine position. Stab incision made over bilateral lateral abdomen and tumescent fluid infiltrated over supraumbilical and infra umbilical abdomen, total 500 ml tumescent infiltrated. Power assisted liposuction performed to endpoint symmetric contour and soft tissue thickness. The fat was then washed and prepared by gravity for infiltration. Harvested fat was then infiltrated in subcutaneous and intramuscular planes over superior medial poles, axilla, and throughout bilateral mastectomy flaps.   At this time bilateral cavities irrigated with solution containing Ancef, gentamicin, and bacitracin. 15 Fr drains placed in right and left breast cavity and secured with 2-0 nylon. The inferior capsule that had been incised earlier along with superficial fascia was then tacked to chest wall to set inframammary fold with interrupted 2-0 PDS sutures. Implant placed in each cavity. Closure was completed with running 3-0 vicryl for approximation of capsule. 4-0 vicryl was placed in dermis and running 4-0 monocryl was used to close skin. Tissue adhesive was applied to IMF incisions. The abdominal incisions were approximated with 4-0 monocryl. Dry dressing and abdominal and breast binder applied.   The patient was allowed to wake from anesthesia, extubated and taken to the recovery room in satisfactory condition.   SPECIMENS: none  DRAINS: 15 Fr JP in right and left  reconstructed breast  Irene Limbo, MD  [facility] Plastic & Reconstructive Surgery [phone]

## 2015-03-13 NOTE — Anesthesia Postprocedure Evaluation (Signed)
Anesthesia Post Note  Patient: Angie Jennings  Procedure(s) Performed: Procedure(s) (LRB): REMOVAL OF BILATERAL TISSUE EXPANDERS WITH PLACEMENT OF BILATERAL BREAST IMPLANTS (Bilateral) LIPO FILLING TO BILATERAL BREAST FROM ABDOMEN (Bilateral)  Patient location during evaluation: PACU Anesthesia Type: General Level of consciousness: awake and alert Pain management: pain level controlled Vital Signs Assessment: post-procedure vital signs reviewed and stable Respiratory status: spontaneous breathing, nonlabored ventilation, respiratory function stable and patient connected to nasal cannula oxygen Cardiovascular status: blood pressure returned to baseline and stable Postop Assessment: no signs of nausea or vomiting Anesthetic complications: no    Last Vitals:  Filed Vitals:   03/13/15 0645 03/13/15 1018  BP: 153/79 132/94  Pulse: 90 89  Temp: 36.8 C 36.9 C  Resp: 20 20    Last Pain:  Filed Vitals:   03/13/15 1044  PainSc: 0-No pain                 Zenaida Deed

## 2015-03-13 NOTE — Interval H&P Note (Signed)
History and Physical Interval Note:  03/13/2015 6:48 AM  Angie Jennings  has presented today for surgery, with the diagnosis of HISTORY OF BREAST CANCER, ACQUIRED ABSENCE OF BREAST  The various methods of treatment have been discussed with the patient and family. After consideration of risks, benefits and other options for treatment, the patient has consented to  Procedure(s): REMOVAL OF BILATERAL TISSUE EXPANDERS WITH PLACEMENT OF BILATERAL BREAST IMPLANTS (Bilateral) LIPO FILLING TO BILATERAL BREAST FROM ABDOMEN (Bilateral) as a surgical intervention .  The patient's history has been reviewed, patient examined, no change in status, stable for surgery.  I have reviewed the patient's chart and labs.  Questions were answered to the patient's satisfaction.     Tane Biegler

## 2015-03-13 NOTE — Anesthesia Procedure Notes (Signed)
Procedure Name: Intubation Date/Time: 03/13/2015 7:28 AM Performed by: Lieutenant Diego Pre-anesthesia Checklist: Patient identified, Emergency Drugs available, Suction available and Patient being monitored Patient Re-evaluated:Patient Re-evaluated prior to inductionOxygen Delivery Method: Circle System Utilized Preoxygenation: Pre-oxygenation with 100% oxygen Intubation Type: IV induction Ventilation: Mask ventilation without difficulty Laryngoscope Size: Miller and 1 Grade View: Grade I Tube type: Oral Tube size: 7.0 mm Number of attempts: 1 Airway Equipment and Method: Stylet and Oral airway Placement Confirmation: ETT inserted through vocal cords under direct vision,  positive ETCO2 and breath sounds checked- equal and bilateral Secured at: 23 cm Tube secured with: Tape Dental Injury: Teeth and Oropharynx as per pre-operative assessment

## 2015-03-13 NOTE — Anesthesia Preprocedure Evaluation (Addendum)
Anesthesia Evaluation  Patient identified by MRN, date of birth, ID band Patient awake    Reviewed: Allergy & Precautions, NPO status , Patient's Chart, lab work & pertinent test results  Airway Mallampati: II  TM Distance: >3 FB Neck ROM: Full    Dental no notable dental hx.    Pulmonary neg pulmonary ROS,    Pulmonary exam normal breath sounds clear to auscultation       Cardiovascular negative cardio ROS Normal cardiovascular exam Rhythm:Regular Rate:Normal     Neuro/Psych negative neurological ROS  negative psych ROS   GI/Hepatic Neg liver ROS, GERD  Medicated,  Endo/Other  negative endocrine ROS  Renal/GU negative Renal ROS  negative genitourinary   Musculoskeletal negative musculoskeletal ROS (+)   Abdominal   Peds negative pediatric ROS (+)  Hematology negative hematology ROS (+)   Anesthesia Other Findings   Reproductive/Obstetrics negative OB ROS                             Anesthesia Physical Anesthesia Plan  ASA: II  Anesthesia Plan: General   Post-op Pain Management:    Induction: Intravenous  Airway Management Planned: Oral ETT  Additional Equipment:   Intra-op Plan:   Post-operative Plan: Extubation in OR  Informed Consent: I have reviewed the patients History and Physical, chart, labs and discussed the procedure including the risks, benefits and alternatives for the proposed anesthesia with the patient or authorized representative who has indicated his/her understanding and acceptance.   Dental advisory given  Plan Discussed with: CRNA and Surgeon  Anesthesia Plan Comments:         Anesthesia Quick Evaluation  

## 2015-03-13 NOTE — Transfer of Care (Signed)
Immediate Anesthesia Transfer of Care Note  Patient: NOHEMI MUSACCHIO  Procedure(s) Performed: Procedure(s): REMOVAL OF BILATERAL TISSUE EXPANDERS WITH PLACEMENT OF BILATERAL BREAST IMPLANTS (Bilateral) LIPO FILLING TO BILATERAL BREAST FROM ABDOMEN (Bilateral)  Patient Location: PACU  Anesthesia Type:General  Level of Consciousness: awake  Airway & Oxygen Therapy: Patient Spontanous Breathing and Patient connected to face mask oxygen  Post-op Assessment: Report given to RN and Post -op Vital signs reviewed and stable  Post vital signs: Reviewed and stable  Last Vitals:  Filed Vitals:   03/13/15 0645  BP: 153/79  Pulse: 90  Temp: 36.8 C  Resp: 20    Complications: No apparent anesthesia complications

## 2015-03-13 NOTE — Brief Op Note (Signed)
03/13/2015  10:17 AM  PATIENT:  Angie Jennings  65 y.o. female  PRE-OPERATIVE DIAGNOSIS:  HISTORY OF BREAST CANCER, ACQUIRED ABSENCE OF BREAST  POST-OPERATIVE DIAGNOSIS:  HISTORY OF BREAST CANCER, AQUIRED ABSCENCE OF BREAST  PROCEDURE:  Procedure(s): REMOVAL OF BILATERAL TISSUE EXPANDERS WITH PLACEMENT OF BILATERAL BREAST IMPLANTS (Bilateral) LIPO FILLING TO BILATERAL BREAST FROM ABDOMEN (Bilateral)  SURGEON:  Surgeon(s) and Role:    * Irene Limbo, MD - Primary  PHYSICIAN ASSISTANT:   ASSISTANTS: none   ANESTHESIA:   general  EBL:  Total I/O In: 1700 [I.V.:1700] Out: - 100 ml  BLOOD ADMINISTERED:none  DRAINS: (15) Jackson-Pratt drain(s) with closed bulb suction in the right and left breast   LOCAL MEDICATIONS USED:  NONE  SPECIMEN:  No Specimen  DISPOSITION OF SPECIMEN:  N/A  COUNTS:  YES  TOURNIQUET:  * No tourniquets in log *  DICTATION: .Note written in EPIC  PLAN OF CARE: Discharge to home after PACU  PATIENT DISPOSITION:  PACU - hemodynamically stable.   Delay start of Pharmacological VTE agent (>24hrs) due to surgical blood loss or risk of bleeding: not applicable

## 2015-03-13 NOTE — Discharge Instructions (Signed)
°Post Anesthesia Home Care Instructions ° °Activity: °Get plenty of rest for the remainder of the day. A responsible adult should stay with you for 24 hours following the procedure.  °For the next 24 hours, DO NOT: °-Drive a car °-Operate machinery °-Drink alcoholic beverages °-Take any medication unless instructed by your physician °-Make any legal decisions or sign important papers. ° °Meals: °Start with liquid foods such as gelatin or soup. Progress to regular foods as tolerated. Avoid greasy, spicy, heavy foods. If nausea and/or vomiting occur, drink only clear liquids until the nausea and/or vomiting subsides. Call your physician if vomiting continues. ° °Special Instructions/Symptoms: °Your throat may feel dry or sore from the anesthesia or the breathing tube placed in your throat during surgery. If this causes discomfort, gargle with warm salt water. The discomfort should disappear within 24 hours. ° °If you had a scopolamine patch placed behind your ear for the management of post- operative nausea and/or vomiting: ° °1. The medication in the patch is effective for 72 hours, after which it should be removed.  Wrap patch in a tissue and discard in the trash. Wash hands thoroughly with soap and water. °2. You may remove the patch earlier than 72 hours if you experience unpleasant side effects which may include dry mouth, dizziness or visual disturbances. °3. Avoid touching the patch. Wash your hands with soap and water after contact with the patch. °  °About my Jackson-Pratt Bulb Drain ° °What is a Jackson-Pratt bulb? °A Jackson-Pratt is a soft, round device used to collect drainage. It is connected to a long, thin drainage catheter, which is held in place by one or two small stiches near your surgical incision site. When the bulb is squeezed, it forms a vacuum, forcing the drainage to empty into the bulb. ° °Emptying the Jackson-Pratt bulb- °To empty the bulb: °1. Release the plug on the top of the bulb. °2.  Pour the bulb's contents into a measuring container which your nurse will provide. °3. Record the time emptied and amount of drainage. Empty the drain(s) as often as your     doctor or nurse recommends. ° °Date                  Time                    Amount (Drain 1)                 Amount (Drain 2) ° °_____________________________________________________________________ ° °_____________________________________________________________________ ° °_____________________________________________________________________ ° °_____________________________________________________________________ ° °_____________________________________________________________________ ° °_____________________________________________________________________ ° °_____________________________________________________________________ ° °_____________________________________________________________________ ° °Squeezing the Jackson-Pratt Bulb- °To squeeze the bulb: °1. Make sure the plug at the top of the bulb is open. °2. Squeeze the bulb tightly in your fist. You will hear air squeezing from the bulb. °3. Replace the plug while the bulb is squeezed. °4. Use a safety pin to attach the bulb to your clothing. This will keep the catheter from     pulling at the bulb insertion site. ° °When to call your doctor- °Call your doctor if: °· Drain site becomes red, swollen or hot. °· You have a fever greater than 101 degrees F. °· There is oozing at the drain site. °· Drain falls out (apply a guaze bandage over the drain hole and secure it with tape). °· Drainage increases daily not related to activity patterns. (You will usually have more drainage when you are active than when you are resting.) °· Drainage has a bad   odor. ° ° °

## 2015-03-14 ENCOUNTER — Encounter (HOSPITAL_BASED_OUTPATIENT_CLINIC_OR_DEPARTMENT_OTHER): Payer: Self-pay | Admitting: Plastic Surgery

## 2015-05-09 ENCOUNTER — Ambulatory Visit (INDEPENDENT_AMBULATORY_CARE_PROVIDER_SITE_OTHER): Payer: BC Managed Care – PPO | Admitting: Gastroenterology

## 2015-05-09 ENCOUNTER — Encounter: Payer: Self-pay | Admitting: Gastroenterology

## 2015-05-09 VITALS — BP 144/84 | HR 95 | Temp 97.0°F | Ht 67.0 in | Wt 155.4 lb

## 2015-05-09 DIAGNOSIS — K219 Gastro-esophageal reflux disease without esophagitis: Secondary | ICD-10-CM

## 2015-05-09 MED ORDER — RABEPRAZOLE SODIUM 10 MG PO CPSP: 1.0000 | ORAL_CAPSULE | Freq: Every day | ORAL | Status: DC

## 2015-05-09 NOTE — Patient Instructions (Addendum)
CONTINUE TO MONITOR SYMPTOMS.  TAKE ACIPHEX EVERY DAY.  AVOID REFLUX TRIGGERS. SEE INFO BELOW.  FOLLOW UP IN 1 YEAR.     Lifestyle and home remedies TO HELP CONTROL HEARTBURN.  You may eliminate or reduce the frequency of heartburn by making the following lifestyle changes:  . Control your weight. Being overweight is a major risk factor for heartburn and GERD. Excess pounds put pressure on your abdomen, pushing up your stomach and causing acid to back up into your esophagus.   . Eat smaller meals. 4 TO 6 MEALS A DAY. This reduces pressure on the lower esophageal sphincter, helping to prevent the valve from opening and acid from washing back into your esophagus.   [name] your belt. Clothes that fit tightly around your waist put pressure on your abdomen and the lower esophageal sphincter.  .  . Eliminate heartburn triggers. Everyone has specific triggers. Common triggers such as fatty or fried foods, spicy food, tomato sauce, carbonated beverages, alcohol, chocolate, mint, garlic, onion, caffeine and nicotine may make heartburn worse.   [name] Kitchen Avoid stooping or bending. Tying your shoes is OK. Bending over for longer periods to weed your garden isn't, especially soon after eating.   . Don't lie down after a meal. Wait at least three to four hours after eating before going to bed, and don't lie down right after eating.   [name] Kitchen PLACE THE HEAD OF YOUR BED ON 6 INCH BLOCKS.  Alternative medicine . Several home remedies exist for treating GERD, but they provide only temporary relief. They include drinking baking soda (sodium bicarbonate) added to water or drinking other fluids such as baking soda mixed with cream of tartar and water. . Although these liquids create temporary relief by neutralizing, washing away or buffering acids, eventually they aggravate the situation by adding gas and fluid to your stomach, increasing pressure and causing more acid reflux. Further, adding more sodium to your diet  may increase your blood pressure and add stress to your heart, and excessive bicarbonate ingestion can alter the acid-base balance in your body.

## 2015-05-09 NOTE — Progress Notes (Signed)
Subjective:    Patient ID: Angie Jennings, female    DOB: [birth year], 65 y.o.   MRN: [id number]  [name], MD  HPI WONDERING IF ACIPHEX CAN CAUSE NEUROPATHY. GETTING CRAMPS IN FEET AND LEGS. GOES TO GYM COUPLE TIMES A WEEK. HAD BREAST CA BUT DID NOT NEED CHEMO. CURRENTLY SYMPTOMS ARE CONTROLLED. RETIRING HAS BEEN GOOD FOR HER. NEEDS NEW RX FOR ACIPHEX. NEVER REALLY NEEDED BACLOFEN AND SO NEVER REALLY TRIED. BMs: EVERY DAY AFTER A CUP OF COFFEE. OCCASIONAL DIARRHEA DUE TO STRESS. PT DENIES FEVER, CHILLS, HEMATOCHEZIA, nausea, vomiting, melena,  CHEST PAIN, SHORTNESS OF BREATH, CHANGE IN BOWEL IN HABITS, constipation, abdominal pain, problems swallowing, OR  heartburn or indigestion.   Past Medical History  Diagnosis Date  . GERD (gastroesophageal reflux disease)   . Anxiety   . Childhood asthma   . Radiation     as child for treatment of keloids on face and neck  . 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 Arthritis     OA    Past Surgical History  Procedure Laterality Date  . Esophagogastroduodenoscopy  04/20/09    mild gastritis  . Esophagogastroduodenoscopy  07/27/09    small internal hemorrhoids/rare sigmoid colon diverticula other wise no polyps  . Bravo capsule endoscopy  05/02/09    GERD with adequate acid suppression  . Abdominal hysterectomy  2006  . Esophagogastroduodenoscopy  11/21/2011    SLF: 1. the mucosa of the esophagus appeared normal 2. NOn-erosive gastritis (inflammation) was found multiple biopsies 3. the duodenal mucosa showed no abnormalitis.   Franki Monte ph study  11/21/2011    Procedure: BRAVO [place];  Surgeon: Danie Binder, MD;  Location: [facility];  Service: Endoscopy;  Laterality: N/A;  . Breast surgery  AUGUST 2015    BREAST BIOPSY   . Colonoscopy  11/21/2011  . Breast biopsy  10/05/14  . Back surgery  1995    fusion of L4 & L5  . Back surgery      lumbar fusion  . Nipple sparing mastectomy/sentinal lymph node  biopsy/reconstruction/placement of tissue expander Bilateral 11/01/2014    Procedure: LEFT NIPPLE SPARING MASTECTOMY WITH LEFT  SENTINAL LYMPH NODE BIOPSY AND RIGHT PROPYLACTIC NIPPLE SPARING MASTECTOMY;  Surgeon: Rolm Bookbinder, MD;  Location: [place];  Service: General;  Laterality: Bilateral;  . Breast reconstruction with placement of tissue expander and flex hd (acellular hydrated dermis) Bilateral 11/01/2014    Procedure: BILATERAL BREAST RECONSTRUCTION WITH PLACEMENT OF TISSUE EXPANDER AND FLEX HD (ACELLULAR HYDRATED DERMIS);  Surgeon: Irene Limbo, MD;  Location: [place];  Service: Plastics;  Laterality: Bilateral;  . Removal of bilateral tissue expanders with placement of bilateral breast implants Bilateral 03/13/2015    Procedure: REMOVAL OF BILATERAL TISSUE EXPANDERS WITH PLACEMENT OF BILATERAL BREAST IMPLANTS;  Surgeon: Irene Limbo, MD;  Location: [place];  Service: Plastics;  Laterality: Bilateral;  . Liposuction with lipofilling Bilateral 03/13/2015    Procedure: LIPO FILLING TO BILATERAL BREAST FROM ABDOMEN;  Surgeon: Irene Limbo, MD;  Location: [place];  Service: Plastics;  Laterality: Bilateral;   Allergies  Allergen Reactions  . Aspirin Other (See Comments)    Pt. states ASA makes her bleed.   . Ioxaglate Hives  . Ivp Dye [Iodinated Diagnostic Agents] Hives  . Kapidex [Dexlansoprazole] Diarrhea  . Lidocaine Nausea Only    Feels hot and flushing  . Metronidazole   . Nsaids   . Clarithromycin  Rash   Current Outpatient Prescriptions  Medication Sig Dispense Refill  . Calcium Citrate-Vitamin D (CITRACAL PETITES/VITAMIN D PO) Take 2 tablets by mouth daily.    . RABEprazole  10 MG tablet Take 10 mg by mouth.     . EFFEXOR 37.5 MG tablet TAKE ONE TABLET BY MOUTH DAILY.    . TYLENOL 500 MG tablet Take 500 mg by mouth every 6 (six) hours as needed.     [name] Kitchen ROBAXIN 500 MG tablet Take 1 tablet (500 mg  total) by mouth every 8 (eight) hours as needed for muscle spasms.    . NONFORMULARY OR COMPOUNDED ITEM Estradiol 0.02 % 59ml prefilled applicator Sig: apply twice a week    .      [name] Kitchen        Review of Systems PER HPI OTHERWISE ALL SYSTEMS ARE NEGATIVE.    Objective:   Physical Exam  Constitutional: She is oriented to person, place, and time. She appears well-developed and well-nourished. No distress.  HENT:  Head: Normocephalic and atraumatic.  Mouth/Throat: Oropharynx is clear and moist. No oropharyngeal exudate.  Eyes: Pupils are equal, round, and reactive to light. No scleral icterus.  Neck: Normal range of motion. Neck supple.  Cardiovascular: Normal rate, regular rhythm and normal heart sounds.   Pulmonary/Chest: Effort normal and breath sounds normal. No respiratory distress.  Abdominal: Soft. Bowel sounds are normal. She exhibits no distension. There is no tenderness.  Musculoskeletal: She exhibits no edema.  Lymphadenopathy:    She has no cervical adenopathy.  Neurological: She is alert and oriented to person, place, and time.  NO FOCAL DEFICITS  Psychiatric: She has a normal mood and affect.  Vitals reviewed.         Assessment & Plan:

## 2015-05-09 NOTE — Assessment & Plan Note (Signed)
SYMPTOMS CONTROLLED/RESOLVED.  AVOID REFLUX TRIGGERS. ACIPHEX 1 OR 2 DAILY FOLLOW UP IN 1 YEAR.

## 2015-05-10 NOTE — Progress Notes (Signed)
CC'ED TO PCP 

## 2015-05-10 NOTE — Progress Notes (Signed)
ON RECALL  °

## 2015-06-25 ENCOUNTER — Other Ambulatory Visit: Payer: Self-pay | Admitting: Anesthesiology

## 2015-06-25 DIAGNOSIS — Z1211 Encounter for screening for malignant neoplasm of colon: Secondary | ICD-10-CM

## 2015-07-04 ENCOUNTER — Encounter: Payer: Self-pay | Admitting: Anesthesiology

## 2015-07-11 NOTE — Patient Instructions (Signed)
Angie Jennings  07/11/2015     @PREFPERIOPPHARMACY @   Your procedure is scheduled on 07/23/2015.  Report to [facility] at 6:15 A.M.  Call this number if you have problems the morning of surgery:  [phone]   Remember:  Do not eat food or drink liquids after midnight.  Take these medicines the morning of surgery with A SIP OF WATER Robaxin, Effexor   Do not wear jewelry, make-up or nail polish.  Do not wear lotions, powders, or perfumes.  You may wear deodorant.  Do not shave 48 hours prior to surgery.  Men may shave face and neck.  Do not bring valuables to the hospital.  [facility] is not responsible for any belongings or valuables.  Contacts, dentures or bridgework may not be worn into surgery.  Leave your suitcase in the car.  After surgery it may be brought to your room.  For patients admitted to the hospital, discharge time will be determined by your treatment team.  Patients discharged the day of surgery will not be allowed to drive home.    Please read over the following fact sheets that you were given. Anesthesia Post-op Instructions     PATIENT INSTRUCTIONS POST-ANESTHESIA  IMMEDIATELY FOLLOWING SURGERY:  Do not drive or operate machinery for the first twenty four hours after surgery.  Do not make any important decisions for twenty four hours after surgery or while taking narcotic pain medications or sedatives.  If you develop intractable nausea and vomiting or a severe headache please notify your doctor immediately.  FOLLOW-UP:  Please make an appointment with your surgeon as instructed. You do not need to follow up with anesthesia unless specifically instructed to do so.  WOUND CARE INSTRUCTIONS (if applicable):  Keep a dry clean dressing on the anesthesia/puncture wound site if there is drainage.  Once the wound has quit draining you may leave it open to air.  Generally you should leave the bandage intact for twenty four hours unless there is drainage.  If  the epidural site drains for more than 36-48 hours please call the anesthesia department.  QUESTIONS?:  Please feel free to call your physician or the hospital operator if you have any questions, and they will be happy to assist you.       A cataract is a clouding of the lens of the eye. When a lens becomes cloudy, vision is reduced based on the degree and nature of the clouding. Surgery may be needed to improve vision. Surgery removes the cloudy lens and usually replaces it with a substitute lens (intraocular lens, IOL). LET YOUR EYE DOCTOR KNOW ABOUT:  Allergies to food or medicine.  Medicines taken including herbs, eye drops, over-the-counter medicines, and creams.  Use of steroids (by mouth or creams).  Previous problems with anesthetics or numbing medicine.  History of bleeding problems or blood clots.  Previous surgery.  Other health problems, including diabetes and kidney problems.  Possibility of pregnancy, if this applies. RISKS AND COMPLICATIONS  Infection.  Inflammation of the eyeball (endophthalmitis) that can spread to both eyes (sympathetic ophthalmia).  Poor wound healing.  If an IOL is inserted, it can later fall out of proper position. This is very uncommon.  Clouding of the part of your eye that holds an IOL in place. This is called an "after-cataract." These are uncommon but easily treated. BEFORE THE PROCEDURE  Do not eat or drink anything except small amounts of water for 8 to 12 before your surgery,  or as directed by your caregiver.  Unless you are told otherwise, continue any eye drops you have been prescribed.  Talk to your primary caregiver about all other medicines that you take (both prescription and nonprescription). In some cases, you may need to stop or change medicines near the time of your surgery. This is most important if you are taking blood-thinning medicine.Do not stop medicines unless you are told to do so.  Arrange for someone to  drive you to and from the procedure.  Do not put contact lenses in either eye on the day of your surgery. PROCEDURE There is more than one method for safely removing a cataract. Your doctor can explain the differences and help determine which is best for you. Phacoemulsification surgery is the most common form of cataract surgery.  An injection is given behind the eye or eye drops are given to make this a painless procedure.  A small cut (incision) is made on the edge of the clear, dome-shaped surface that covers the front of the eye (cornea).  A tiny probe is painlessly inserted into the eye. This device gives off ultrasound waves that soften and break up the cloudy center of the lens. This makes it easier for the cloudy lens to be removed by suction.  An IOL may be implanted.  The normal lens of the eye is covered by a clear capsule. Part of that capsule is intentionally left in the eye to support the IOL.  Your surgeon may or may not use stitches to close the incision. There are other forms of cataract surgery that require a larger incision and stitches to close the eye. This approach is taken in cases where the doctor feels that the cataract cannot be easily removed using phacoemulsification. AFTER THE PROCEDURE  When an IOL is implanted, it does not need care. It becomes a permanent part of your eye and cannot be seen or felt.  Your doctor will schedule follow-up exams to check on your progress.  Review your other medicines with your doctor to see which can be resumed after surgery.  Use eye drops or take medicine as prescribed by your doctor.   This information is not intended to replace advice given to you by your health care provider. Make sure you discuss any questions you have with your health care provider.   Document Released: 01/23/2011 Document Revised: 02/24/2014 Document Reviewed: 01/23/2011 Elsevier Interactive Patient Education [employer].

## 2015-07-13 ENCOUNTER — Encounter (HOSPITAL_COMMUNITY): Payer: Self-pay

## 2015-07-17 ENCOUNTER — Other Ambulatory Visit: Payer: Self-pay

## 2015-07-17 ENCOUNTER — Encounter (HOSPITAL_COMMUNITY)
Admission: RE | Admit: 2015-07-17 | Discharge: 2015-07-17 | Disposition: A | Discharge status: Home / Self Care | Payer: BC Managed Care – PPO | Source: Ambulatory Visit | Attending: Ophthalmology | Admitting: Ophthalmology

## 2015-07-17 ENCOUNTER — Encounter (HOSPITAL_COMMUNITY): Payer: Self-pay

## 2015-07-17 DIAGNOSIS — Z01812 Encounter for preprocedural laboratory examination: Secondary | ICD-10-CM | POA: Diagnosis present

## 2015-07-17 DIAGNOSIS — Z01810 Encounter for preprocedural cardiovascular examination: Secondary | ICD-10-CM | POA: Insufficient documentation

## 2015-07-17 LAB — BASIC METABOLIC PANEL
ANION GAP: 6 (ref 5–15)
BUN: 11 mg/dL (ref 6–20)
CALCIUM: 9.4 mg/dL (ref 8.9–10.3)
CO2: 28 mmol/L (ref 22–32)
CREATININE: 0.71 mg/dL (ref 0.44–1.00)
Chloride: 104 mmol/L (ref 101–111)
GLUCOSE: 95 mg/dL (ref 65–99)
Potassium: 4.6 mmol/L (ref 3.5–5.1)
Sodium: 138 mmol/L (ref 135–145)

## 2015-07-17 LAB — CBC
HCT: 44.9 % (ref 36.0–46.0)
Hemoglobin: 14.8 g/dL (ref 12.0–15.0)
MCH: 29.1 pg (ref 26.0–34.0)
MCHC: 33 g/dL (ref 30.0–36.0)
MCV: 88.4 fL (ref 78.0–100.0)
PLATELETS: 253 10*3/uL (ref 150–400)
RBC: 5.08 MIL/uL (ref 3.87–5.11)
RDW: 13.7 % (ref 11.5–15.5)
WBC: 7.5 10*3/uL (ref 4.0–10.5)

## 2015-07-23 ENCOUNTER — Ambulatory Visit (HOSPITAL_COMMUNITY)
Admission: RE | Admit: 2015-07-23 | Discharge: 2015-07-23 | Disposition: A | Discharge status: Home / Self Care | Payer: BC Managed Care – PPO | Source: Ambulatory Visit | Attending: Ophthalmology | Admitting: Ophthalmology

## 2015-07-23 ENCOUNTER — Ambulatory Visit (HOSPITAL_COMMUNITY): Payer: BC Managed Care – PPO | Admitting: Anesthesiology

## 2015-07-23 ENCOUNTER — Encounter (HOSPITAL_COMMUNITY)
Admission: RE | Disposition: A | Discharge status: Home / Self Care | Payer: Self-pay | Source: Ambulatory Visit | Attending: Ophthalmology

## 2015-07-23 DIAGNOSIS — K219 Gastro-esophageal reflux disease without esophagitis: Secondary | ICD-10-CM | POA: Insufficient documentation

## 2015-07-23 DIAGNOSIS — Z7982 Long term (current) use of aspirin: Secondary | ICD-10-CM | POA: Diagnosis not present

## 2015-07-23 DIAGNOSIS — H2511 Age-related nuclear cataract, right eye: Secondary | ICD-10-CM | POA: Insufficient documentation

## 2015-07-23 DIAGNOSIS — Z79899 Other long term (current) drug therapy: Secondary | ICD-10-CM | POA: Diagnosis not present

## 2015-07-23 DIAGNOSIS — F419 Anxiety disorder, unspecified: Secondary | ICD-10-CM | POA: Diagnosis not present

## 2015-07-23 HISTORY — PX: CATARACT EXTRACTION W/PHACO: SHX586

## 2015-07-23 SURGERY — PHACOEMULSIFICATION, CATARACT, WITH IOL INSERTION
Anesthesia: Monitor Anesthesia Care | Laterality: Right

## 2015-07-23 MED ORDER — TETRACAINE HCL 0.5 % OP SOLN
OPHTHALMIC | Status: AC
  Filled 2015-07-23: qty 4

## 2015-07-23 MED ORDER — MIDAZOLAM HCL 2 MG/2ML IJ SOLN
INTRAMUSCULAR | Status: DC | PRN
  Administered 2015-07-23: 1 mg via INTRAVENOUS
  Administered 2015-07-23 (×2): 0.5 mg via INTRAVENOUS

## 2015-07-23 MED ORDER — NA HYALUR & NA CHOND-NA HYALUR 0.55-0.5 ML IO KIT
PACK | INTRAOCULAR | Status: DC | PRN
  Administered 2015-07-23: 1 via OPHTHALMIC

## 2015-07-23 MED ORDER — LIDOCAINE HCL 3.5 % OP GEL
OPHTHALMIC | Status: AC
  Filled 2015-07-23: qty 1

## 2015-07-23 MED ORDER — LACTATED RINGERS IV SOLN
INTRAVENOUS | Status: DC
  Administered 2015-07-23: 07:00:00 via INTRAVENOUS

## 2015-07-23 MED ORDER — LIDOCAINE HCL 3.5 % OP GEL
OPHTHALMIC | Status: DC | PRN
  Administered 2015-07-23: 1 via OPHTHALMIC

## 2015-07-23 MED ORDER — MIDAZOLAM HCL 2 MG/2ML IJ SOLN
INTRAMUSCULAR | Status: AC
  Filled 2015-07-23: qty 2

## 2015-07-23 MED ORDER — TETRACAINE 0.5 % OP SOLN OPTIME - NO CHARGE
OPHTHALMIC | Status: DC | PRN
  Administered 2015-07-23: 2 [drp] via OPHTHALMIC

## 2015-07-23 MED ORDER — CYCLOPENTOLATE-PHENYLEPHRINE 0.2-1 % OP SOLN
1.0000 [drp] | OPHTHALMIC | Status: AC
  Administered 2015-07-23 (×3): 1 [drp] via OPHTHALMIC

## 2015-07-23 MED ORDER — PHENYLEPHRINE-KETOROLAC 1-0.3 % IO SOLN
INTRAOCULAR | Status: DC | PRN
  Administered 2015-07-23: 500 mL via OPHTHALMIC

## 2015-07-23 MED ORDER — LIDOCAINE HCL 3.5 % OP GEL
1.0000 "application " | Freq: Once | OPHTHALMIC | Status: AC
  Administered 2015-07-23: 1 via OPHTHALMIC

## 2015-07-23 MED ORDER — CYCLOPENTOLATE-PHENYLEPHRINE OP SOLN OPTIME - NO CHARGE
OPHTHALMIC | Status: AC
  Filled 2015-07-23: qty 2

## 2015-07-23 MED ORDER — TETRACAINE HCL 0.5 % OP SOLN
1.0000 [drp] | OPHTHALMIC | Status: AC
  Administered 2015-07-23 (×3): 1 [drp] via OPHTHALMIC

## 2015-07-23 MED ORDER — FENTANYL CITRATE (PF) 100 MCG/2ML IJ SOLN
25.0000 ug | INTRAMUSCULAR | Status: AC
  Administered 2015-07-23 (×2): 25 ug via INTRAVENOUS

## 2015-07-23 MED ORDER — BSS IO SOLN
INTRAOCULAR | Status: DC | PRN
  Administered 2015-07-23: 15 mL

## 2015-07-23 MED ORDER — PHENYLEPHRINE-KETOROLAC 1-0.3 % IO SOLN
INTRAOCULAR | Status: AC
  Filled 2015-07-23: qty 4

## 2015-07-23 MED ORDER — MIDAZOLAM HCL 2 MG/2ML IJ SOLN
1.0000 mg | INTRAMUSCULAR | Status: DC | PRN
  Administered 2015-07-23 (×2): 1 mg via INTRAVENOUS

## 2015-07-23 MED ORDER — POVIDONE-IODINE 5 % OP SOLN
OPHTHALMIC | Status: DC | PRN
  Administered 2015-07-23: 1 via OPHTHALMIC

## 2015-07-23 MED ORDER — FENTANYL CITRATE (PF) 100 MCG/2ML IJ SOLN
INTRAMUSCULAR | Status: AC
  Filled 2015-07-23: qty 2

## 2015-07-23 SURGICAL SUPPLY — 7 items
CLOTH BEACON ORANGE TIMEOUT ST (SAFETY) ×2 IMPLANT
GLOVE BIOGEL PI IND STRL 7.0 (GLOVE) IMPLANT
GLOVE BIOGEL PI INDICATOR 7.0 (GLOVE) ×4
INST SET CATARACT ~~LOC~~ (KITS) ×3 IMPLANT
LENS ALC ACRYL/TECN (Ophthalmic Related) ×3 IMPLANT
PAD ARMBOARD 7.5X6 YLW CONV (MISCELLANEOUS) ×2 IMPLANT
WATER STERILE IRR 250ML POUR (IV SOLUTION) ×2 IMPLANT

## 2015-07-23 NOTE — Anesthesia Procedure Notes (Signed)
Procedure Name: MAC Date/Time: 07/23/2015 7:34 AM Performed by: Vista Deck Pre-anesthesia Checklist: Patient identified, Emergency Drugs available, Suction available, Timeout performed and Patient being monitored Patient Re-evaluated:Patient Re-evaluated prior to inductionOxygen Delivery Method: Nasal Cannula

## 2015-07-23 NOTE — Transfer of Care (Signed)
Immediate Anesthesia Transfer of Care Note  Patient: Angie Jennings  Procedure(s) Performed: Procedure(s) (LRB): CATARACT EXTRACTION PHACO AND INTRAOCULAR LENS PLACEMENT RIGHT EYE CDE=1.72 (Right)  Patient Location: Shortstay  Anesthesia Type: MAC  Level of Consciousness: awake  Airway & Oxygen Therapy: Patient Spontanous Breathing   Post-op Assessment: Report given to PACU RN, Post -op Vital signs reviewed and stable and Patient moving all extremities  Post vital signs: Reviewed and stable  Complications: No apparent anesthesia complications

## 2015-07-23 NOTE — Op Note (Signed)
07/23/2015  10:19 AM  PATIENT:  Angie Jennings  65 y.o. female  PRE-OPERATIVE DIAGNOSIS:  nuclear cataract right eye  POST-OPERATIVE DIAGNOSIS:  nuclear cataract right eye  PROCEDURE:  Procedure(s): CATARACT EXTRACTION PHACO AND INTRAOCULAR LENS PLACEMENT RIGHT EYE CDE=1.72  SURGEON:  Surgeon(s): Williams Che, MD  ASSISTANTS:   Bonney Roussel, CST   ANESTHESIA STAFF: Anesthesiologist: Lerry Liner, MD CRNA: Vista Deck, CRNA  ANESTHESIA:   topical and MAC  REQUESTED LENS POWER: 14.0  LENS IMPLANT INFORMATION:   Alocn [id number]  S/n [id number]  Exp 05/2016  CUMULATIVE DISSIPATED ENERGY:1.72  INDICATIONS:see scanned office H&P for details  OP FINDINGS:mod NS w "lens milk"  COMPLICATIONS:None  PROCEDURE:  The patient was brought to the operating room in good condition.  The operative eye was prepped and draped in the usual fashion for intraocular surgery.  Lidocaine gel was dropped onto the eye.  A 2.4 mm 10 O'clock near clear corneal stepped incision and a 12 O'clock stab incision were created.  Viscoat was instilled into the anterior chamber.  The 5 mm anterior capsulorhexis was performed with a bent needle cystotome and Utrata forceps.  The lens was hydrodissected and hydrodelineated with a cannula and balanced salt solution and rotated with a Kuglen hook.  Phacoemulsification was perfomed in the divide and conquer technique.  The remaining cortex was removed with I&A and the capsular surfaces polished as necessary.  Provisc was placed into the capsular bag and the lens inserted with the Alcon inserter.  The viscoelastic was removed with I&A and the lens "rocked" into position.  The wounds were hydrated and te anterior chamber was refilled with balanced salt solution.  The wounds were checked for leakage and rehydrated as necessary.  The lid speculum and drapes were removed and the patient was transported to short stay in good condition.  PATIENT DISPOSITION:  Short Stay

## 2015-07-23 NOTE — Anesthesia Postprocedure Evaluation (Signed)
  Anesthesia Post-op Note  Patient: Angie Jennings  Procedure(s) Performed: Procedure(s) (LRB): CATARACT EXTRACTION PHACO AND INTRAOCULAR LENS PLACEMENT RIGHT EYE CDE=1.72 (Right)  Patient Location:  Short Stay  Anesthesia Type: MAC  Level of Consciousness: awake  Airway and Oxygen Therapy: Patient Spontanous Breathing  Post-op Pain: none  Post-op Assessment: Post-op Vital signs reviewed, Patient's Cardiovascular Status Stable, Respiratory Function Stable, Patent Airway, No signs of Nausea or vomiting and Pain level controlled  Post-op Vital Signs: Reviewed and stable  Complications: No apparent anesthesia complications

## 2015-07-23 NOTE — Discharge Instructions (Signed)

## 2015-07-23 NOTE — Brief Op Note (Signed)
07/23/2015  10:19 AM  PATIENT:  Angie Jennings  65 y.o. female  PRE-OPERATIVE DIAGNOSIS:  nuclear cataract right eye  POST-OPERATIVE DIAGNOSIS:  nuclear cataract right eye  PROCEDURE:  Procedure(s): CATARACT EXTRACTION PHACO AND INTRAOCULAR LENS PLACEMENT RIGHT EYE CDE=1.72  SURGEON:  Surgeon(s): Williams Che, MD  ASSISTANTS:   Bonney Roussel, CST   ANESTHESIA STAFF: Anesthesiologist: Lerry Liner, MD CRNA: Vista Deck, CRNA  ANESTHESIA:   topical and MAC  REQUESTED LENS POWER: 14.0  LENS IMPLANT INFORMATION:   Alocn [id number]  S/n [id number]  Exp 05/2016  CUMULATIVE DISSIPATED ENERGY:1.72  INDICATIONS:see scanned office H&P for details  OP FINDINGS:mod NS w "lens milk"  COMPLICATIONS:None  DICTATION #: none  PLAN OF CARE: as above  PATIENT DISPOSITION:  Short Stay

## 2015-07-23 NOTE — Anesthesia Preprocedure Evaluation (Signed)
Anesthesia Evaluation  Patient identified by MRN, date of birth, ID band Patient awake    Reviewed: Allergy & Precautions, NPO status , Patient's Chart, lab work & pertinent test results  Airway Mallampati: II  TM Distance: >3 FB Neck ROM: Full    Dental no notable dental hx.    Pulmonary neg pulmonary ROS, asthma (inactive) ,    Pulmonary exam normal breath sounds clear to auscultation       Cardiovascular negative cardio ROS Normal cardiovascular exam Rhythm:Regular Rate:Normal     Neuro/Psych Anxiety negative neurological ROS  negative psych ROS   GI/Hepatic Neg liver ROS, GERD  Medicated and Controlled,  Endo/Other  negative endocrine ROS  Renal/GU negative Renal ROS  negative genitourinary   Musculoskeletal negative musculoskeletal ROS (+)   Abdominal   Peds negative pediatric ROS (+)  Hematology negative hematology ROS (+)   Anesthesia Other Findings   Reproductive/Obstetrics negative OB ROS                             Anesthesia Physical Anesthesia Plan  ASA: II  Anesthesia Plan: MAC   Post-op Pain Management:    Induction: Intravenous  Airway Management Planned: Nasal Cannula  Additional Equipment:   Intra-op Plan:   Post-operative Plan:   Informed Consent: I have reviewed the patients History and Physical, chart, labs and discussed the procedure including the risks, benefits and alternatives for the proposed anesthesia with the patient or authorized representative who has indicated his/her understanding and acceptance.     Plan Discussed with:   Anesthesia Plan Comments:         Anesthesia Quick Evaluation

## 2015-07-23 NOTE — H&P (Signed)
I have reviewed the pre printed H&P, the patient was re-examined, and I have identified no significant interval changes in the patient's medical condition.  There is no change in the plan of care since the history and physical of record. 

## 2015-07-25 ENCOUNTER — Encounter (HOSPITAL_COMMUNITY): Payer: Self-pay | Admitting: Ophthalmology

## 2015-10-29 NOTE — Patient Instructions (Signed)
Angie Jennings  10/29/2015     @PREFPERIOPPHARMACY @   Your procedure is scheduled on 11/05/2015  Report to [facility] at 6:15 A.M.  Call this number if you have problems the morning of surgery:  [phone]   Remember:  Do not eat food or drink liquids after midnight.  Take these medicines the morning of surgery with A SIP OF WATER Effexor   Do not wear jewelry, make-up or nail polish.  Do not wear lotions, powders, or perfumes, or deoderant.  Do not shave 48 hours prior to surgery.  Men may shave face and neck.  Do not bring valuables to the hospital.  [facility] is not responsible for any belongings or valuables.  Contacts, dentures or bridgework may not be worn into surgery.  Leave your suitcase in the car.  After surgery it may be brought to your room.  For patients admitted to the hospital, discharge time will be determined by your treatment team.  Patients discharged the day of surgery will not be allowed to drive home.    Please read over the following fact sheets that you were given. Anesthesia Post-op Instructions     PATIENT INSTRUCTIONS POST-ANESTHESIA  IMMEDIATELY FOLLOWING SURGERY:  Do not drive or operate machinery for the first twenty four hours after surgery.  Do not make any important decisions for twenty four hours after surgery or while taking narcotic pain medications or sedatives.  If you develop intractable nausea and vomiting or a severe headache please notify your doctor immediately.  FOLLOW-UP:  Please make an appointment with your surgeon as instructed. You do not need to follow up with anesthesia unless specifically instructed to do so.  WOUND CARE INSTRUCTIONS (if applicable):  Keep a dry clean dressing on the anesthesia/puncture wound site if there is drainage.  Once the wound has quit draining you may leave it open to air.  Generally you should leave the bandage intact for twenty four hours unless there is drainage.  If the epidural site  drains for more than 36-48 hours please call the anesthesia department.  QUESTIONS?:  Please feel free to call your physician or the hospital operator if you have any questions, and they will be happy to assist you.       A cataract is a clouding of the lens of the eye. When a lens becomes cloudy, vision is reduced based on the degree and nature of the clouding. Surgery may be needed to improve vision. Surgery removes the cloudy lens and usually replaces it with a substitute lens (intraocular lens, IOL). LET YOUR EYE DOCTOR KNOW ABOUT:  Allergies to food or medicine.  Medicines taken including herbs, eye drops, over-the-counter medicines, and creams.  Use of steroids (by mouth or creams).  Previous problems with anesthetics or numbing medicine.  History of bleeding problems or blood clots.  Previous surgery.  Other health problems, including diabetes and kidney problems.  Possibility of pregnancy, if this applies. RISKS AND COMPLICATIONS  Infection.  Inflammation of the eyeball (endophthalmitis) that can spread to both eyes (sympathetic ophthalmia).  Poor wound healing.  If an IOL is inserted, it can later fall out of proper position. This is very uncommon.  Clouding of the part of your eye that holds an IOL in place. This is called an "after-cataract." These are uncommon but easily treated. BEFORE THE PROCEDURE  Do not eat or drink anything except small amounts of water for 8 to 12 before your surgery, or as directed by  your caregiver.  Unless you are told otherwise, continue any eye drops you have been prescribed.  Talk to your primary caregiver about all other medicines that you take (both prescription and nonprescription). In some cases, you may need to stop or change medicines near the time of your surgery. This is most important if you are taking blood-thinning medicine.Do not stop medicines unless you are told to do so.  Arrange for someone to drive you to and from  the procedure.  Do not put contact lenses in either eye on the day of your surgery. PROCEDURE There is more than one method for safely removing a cataract. Your doctor can explain the differences and help determine which is best for you. Phacoemulsification surgery is the most common form of cataract surgery.  An injection is given behind the eye or eye drops are given to make this a painless procedure.  A small cut (incision) is made on the edge of the clear, dome-shaped surface that covers the front of the eye (cornea).  A tiny probe is painlessly inserted into the eye. This device gives off ultrasound waves that soften and break up the cloudy center of the lens. This makes it easier for the cloudy lens to be removed by suction.  An IOL may be implanted.  The normal lens of the eye is covered by a clear capsule. Part of that capsule is intentionally left in the eye to support the IOL.  Your surgeon may or may not use stitches to close the incision. There are other forms of cataract surgery that require a larger incision and stitches to close the eye. This approach is taken in cases where the doctor feels that the cataract cannot be easily removed using phacoemulsification. AFTER THE PROCEDURE  When an IOL is implanted, it does not need care. It becomes a permanent part of your eye and cannot be seen or felt.  Your doctor will schedule follow-up exams to check on your progress.  Review your other medicines with your doctor to see which can be resumed after surgery.  Use eye drops or take medicine as prescribed by your doctor.   This information is not intended to replace advice given to you by your health care provider. Make sure you discuss any questions you have with your health care provider.   Document Released: 01/23/2011 Document Revised: 02/24/2014 Document Reviewed: 01/23/2011 Elsevier Interactive Patient Education [employer].

## 2015-10-30 ENCOUNTER — Encounter (HOSPITAL_COMMUNITY): Payer: Self-pay

## 2015-10-30 ENCOUNTER — Encounter (HOSPITAL_COMMUNITY)
Admission: RE | Admit: 2015-10-30 | Discharge: 2015-10-30 | Disposition: A | Discharge status: Home / Self Care | Payer: BC Managed Care – PPO | Source: Ambulatory Visit | Attending: Ophthalmology | Admitting: Ophthalmology

## 2015-10-30 DIAGNOSIS — Z01812 Encounter for preprocedural laboratory examination: Secondary | ICD-10-CM | POA: Diagnosis present

## 2015-10-30 DIAGNOSIS — H2512 Age-related nuclear cataract, left eye: Secondary | ICD-10-CM | POA: Diagnosis not present

## 2015-10-30 LAB — BASIC METABOLIC PANEL
ANION GAP: 10 (ref 5–15)
BUN: 16 mg/dL (ref 6–20)
CALCIUM: 9.6 mg/dL (ref 8.9–10.3)
CO2: 25 mmol/L (ref 22–32)
CREATININE: 0.92 mg/dL (ref 0.44–1.00)
Chloride: 104 mmol/L (ref 101–111)
Glucose, Bld: 80 mg/dL (ref 65–99)
Potassium: 4.9 mmol/L (ref 3.5–5.1)
Sodium: 139 mmol/L (ref 135–145)

## 2015-10-30 LAB — CBC
HEMATOCRIT: 45.1 % (ref 36.0–46.0)
Hemoglobin: 15.1 g/dL — ABNORMAL HIGH (ref 12.0–15.0)
MCH: 30.2 pg (ref 26.0–34.0)
MCHC: 33.5 g/dL (ref 30.0–36.0)
MCV: 90.2 fL (ref 78.0–100.0)
PLATELETS: 208 10*3/uL (ref 150–400)
RBC: 5 MIL/uL (ref 3.87–5.11)
RDW: 13.7 % (ref 11.5–15.5)
WBC: 7.4 10*3/uL (ref 4.0–10.5)

## 2015-11-02 MED ORDER — CYCLOPENTOLATE-PHENYLEPHRINE 0.2-1 % OP SOLN
OPHTHALMIC | Status: AC
Start: 2015-11-02 — End: 2015-11-02
  Filled 2015-11-02: qty 2

## 2015-11-02 MED ORDER — TETRACAINE HCL 0.5 % OP SOLN
OPHTHALMIC | Status: AC
  Filled 2015-11-02: qty 4

## 2015-11-02 MED ORDER — LIDOCAINE HCL 3.5 % OP GEL
OPHTHALMIC | Status: AC
  Filled 2015-11-02: qty 1

## 2015-11-02 MED ORDER — CYCLOPENTOLATE-PHENYLEPHRINE 0.2-1 % OP SOLN
OPHTHALMIC | Status: AC
  Filled 2015-11-02: qty 2

## 2015-11-05 ENCOUNTER — Ambulatory Visit (HOSPITAL_COMMUNITY): Payer: BC Managed Care – PPO | Admitting: Anesthesiology

## 2015-11-05 ENCOUNTER — Ambulatory Visit (HOSPITAL_COMMUNITY)
Admission: RE | Admit: 2015-11-05 | Discharge: 2015-11-05 | Disposition: A | Discharge status: Home / Self Care | Payer: BC Managed Care – PPO | Source: Ambulatory Visit | Attending: Ophthalmology | Admitting: Ophthalmology

## 2015-11-05 ENCOUNTER — Encounter (HOSPITAL_COMMUNITY)
Admission: RE | Disposition: A | Discharge status: Home / Self Care | Payer: Self-pay | Source: Ambulatory Visit | Attending: Ophthalmology

## 2015-11-05 ENCOUNTER — Encounter (HOSPITAL_COMMUNITY): Payer: Self-pay | Admitting: *Deleted

## 2015-11-05 DIAGNOSIS — H269 Unspecified cataract: Secondary | ICD-10-CM | POA: Insufficient documentation

## 2015-11-05 HISTORY — PX: CATARACT EXTRACTION W/PHACO: SHX586

## 2015-11-05 SURGERY — PHACOEMULSIFICATION, CATARACT, WITH IOL INSERTION
Anesthesia: Monitor Anesthesia Care | Site: Eye | Laterality: Left

## 2015-11-05 MED ORDER — LIDOCAINE HCL 3.5 % OP GEL
OPHTHALMIC | Status: DC | PRN
  Administered 2015-11-05: 1 via OPHTHALMIC

## 2015-11-05 MED ORDER — LIDOCAINE HCL 3.5 % OP GEL: 1.0000 "application " | Freq: Once | OPHTHALMIC | Status: DC

## 2015-11-05 MED ORDER — CYCLOPENTOLATE-PHENYLEPHRINE 0.2-1 % OP SOLN
1.0000 [drp] | OPHTHALMIC | Status: AC
  Administered 2015-11-05 (×3): 1 [drp] via OPHTHALMIC

## 2015-11-05 MED ORDER — BSS IO SOLN
INTRAOCULAR | Status: DC | PRN
  Administered 2015-11-05: 15 mL via INTRAOCULAR

## 2015-11-05 MED ORDER — POVIDONE-IODINE 5 % OP SOLN
OPHTHALMIC | Status: DC | PRN
  Administered 2015-11-05: 1 via OPHTHALMIC

## 2015-11-05 MED ORDER — TETRACAINE 0.5 % OP SOLN OPTIME - NO CHARGE
OPHTHALMIC | Status: DC | PRN
  Administered 2015-11-05: 2 [drp] via OPHTHALMIC

## 2015-11-05 MED ORDER — TETRACAINE HCL 0.5 % OP SOLN
1.0000 [drp] | OPHTHALMIC | Status: AC
  Administered 2015-11-05 (×3): 1 [drp] via OPHTHALMIC

## 2015-11-05 MED ORDER — LIDOCAINE HCL (PF) 1 % IJ SOLN
INTRAMUSCULAR | Status: AC
  Filled 2015-11-05: qty 2

## 2015-11-05 MED ORDER — PHENYLEPHRINE-KETOROLAC 1-0.3 % IO SOLN
INTRAOCULAR | Status: AC
  Filled 2015-11-05: qty 4

## 2015-11-05 MED ORDER — FENTANYL CITRATE (PF) 100 MCG/2ML IJ SOLN
25.0000 ug | INTRAMUSCULAR | Status: AC | PRN
Start: 2015-11-05 — End: 2015-11-05
  Administered 2015-11-05 (×2): 25 ug via INTRAVENOUS
  Filled 2015-11-05: qty 2

## 2015-11-05 MED ORDER — MIDAZOLAM HCL 2 MG/2ML IJ SOLN
1.0000 mg | INTRAMUSCULAR | Status: DC | PRN
  Administered 2015-11-05 (×2): 2 mg via INTRAVENOUS
  Filled 2015-11-05 (×2): qty 2

## 2015-11-05 MED ORDER — NA HYALUR & NA CHOND-NA HYALUR 0.55-0.5 ML IO KIT
PACK | INTRAOCULAR | Status: DC | PRN
  Administered 2015-11-05: 1 via OPHTHALMIC

## 2015-11-05 MED ORDER — LACTATED RINGERS IV SOLN
INTRAVENOUS | Status: DC
  Administered 2015-11-05: 07:00:00 via INTRAVENOUS

## 2015-11-05 MED ORDER — PHENYLEPHRINE-KETOROLAC 1-0.3 % IO SOLN
INTRAOCULAR | Status: DC | PRN
  Administered 2015-11-05: 500 mL via OPHTHALMIC

## 2015-11-05 SURGICAL SUPPLY — 20 items
CAPSULAR TENSION RING-AMO (OPHTHALMIC RELATED) IMPLANT
CLOTH BEACON ORANGE TIMEOUT ST (SAFETY) ×2 IMPLANT
GLOVE BIOGEL PI IND STRL 7.0 (GLOVE) IMPLANT
GLOVE BIOGEL PI INDICATOR 7.0 (GLOVE) ×2
GLOVE EXAM NITRILE LRG STRL (GLOVE) IMPLANT
GLOVE EXAM NITRILE MD LF STRL (GLOVE) ×2 IMPLANT
GLOVE SKINSENSE NS SZ6.5 (GLOVE)
GLOVE SKINSENSE STRL SZ6.5 (GLOVE) IMPLANT
INST SET CATARACT ~~LOC~~ (KITS) ×3 IMPLANT
KIT VITRECTOMY (OPHTHALMIC RELATED) IMPLANT
LENS ALC ACRYL/TECN (Ophthalmic Related) ×3 IMPLANT
PAD ARMBOARD 7.5X6 YLW CONV (MISCELLANEOUS) ×2 IMPLANT
PROC W NO LENS (INTRAOCULAR LENS)
PROC W SPEC LENS (INTRAOCULAR LENS)
PROCESS W NO LENS (INTRAOCULAR LENS) IMPLANT
PROCESS W SPEC LENS (INTRAOCULAR LENS) IMPLANT
RETRACTOR IRIS SIGHTPATH (OPHTHALMIC RELATED) IMPLANT
RING MALYGIN (MISCELLANEOUS) IMPLANT
VISCOELASTIC ADDITIONAL (OPHTHALMIC RELATED) IMPLANT
WATER STERILE IRR 250ML POUR (IV SOLUTION) ×2 IMPLANT

## 2015-11-05 NOTE — Discharge Instructions (Signed)
·  °  SEEK IMMEDIATE MEDICAL CARE IF:   You have a worsening or sudden vision loss.  You notice redness, swelling, or increasing pain in the eye.  You have a fever.

## 2015-11-05 NOTE — Op Note (Signed)
11/05/2015  8:33 AM  PATIENT:  Angie Jennings  65 y.o. female  PRE-OPERATIVE DIAGNOSIS:  nuclear cataract left eye  POST-OPERATIVE DIAGNOSIS:  nuclear cataract left eye  PROCEDURE:  Procedure(s): CATARACT EXTRACTION PHACO AND INTRAOCULAR LENS PLACEMENT; CDE:  3.17  SURGEON:  Surgeon(s): Williams Che, MD  ASSISTANTS:  Cleda Clarks, CST   ANESTHESIA STAFF: Anesthesiologist: Lerry Liner, MD CRNA: Ollen Bowl, CRNA  ANESTHESIA:   topical and MAC  REQUESTED LENS POWER: 16.5  LENS IMPLANT INFORMATION: @ORIMPLANT @  CUMULATIVE DISSIPATED ENERGY:3.17  INDICATIONS:see scanned office H&P for details  OP FINDINGS:mod dense NS  COMPLICATIONS:None PROCEDURE:  The patient was brought to the operating room in good condition.  The operative eye was prepped and draped in the usual fashion for intraocular surgery.  Lidocaine gel was dropped onto the eye.  A 2.4 mm 10 O'clock near clear corneal stepped incision and a 12 O'clock stab incision were created.  Viscoat was instilled into the anterior chamber.  The 5 mm anterior capsulorhexis was performed with a bent needle cystotome and Utrata forceps.  The lens was hydrodissected and hydrodelineated with a cannula and balanced salt solution and rotated with a Kuglen hook.  Phacoemulsification was perfomed in the divide and conquer technique.  The remaining cortex was removed with I&A and the capsular surfaces polished as necessary.  Provisc was placed into the capsular bag and the lens inserted with the Alcon inserter.  The viscoelastic was removed with I&A and the lens "rocked" into position.  The wounds were hydrated and te anterior chamber was refilled with balanced salt solution.  The wounds were checked for leakage and rehydrated as necessary.  The lid speculum and drapes were removed and the patient was transported to short stay in good condition.PROCEDURE:  The patient was brought to the operating room in good condition.  The  operative eye was prepped and draped in the usual fashion for intraocular surgery.  Lidocaine gel was dropped onto the eye.  A 2.4 mm 10 O'clock near clear corneal stepped incision and a 12 O'clock stab incision were created.  Viscoat was instilled into the anterior chamber.  The 5 mm anterior capsulorhexis was performed with a bent needle cystotome and Utrata forceps.  The lens was hydrodissected and hydrodelineated with a cannula and balanced salt solution and rotated with a Kuglen hook.  Phacoemulsification was perfomed in the divide and conquer technique.  The remaining cortex was removed with I&A and the capsular surfaces polished as necessary.  Provisc was placed into the capsular bag and the lens inserted with the Alcon inserter.  The viscoelastic was removed with I&A and the lens "rocked" into position.  The wounds were hydrated and te anterior chamber was refilled with balanced salt solution.  The wounds were checked for leakage and rehydrated as necessary.  The lid speculum and drapes were removed and the patient was transported to short stay in good condition.  PATIENT DISPOSITION:  Short Stay

## 2015-11-05 NOTE — Transfer of Care (Signed)
Immediate Anesthesia Transfer of Care Note  Patient: Angie Jennings  Procedure(s) Performed: Procedure(s): CATARACT EXTRACTION PHACO AND INTRAOCULAR LENS PLACEMENT; CDE:  3.17 (Left)  Patient Location: Short Stay  Anesthesia Type:MAC  Level of Consciousness: awake  Airway & Oxygen Therapy: Patient Spontanous Breathing  Post-op Assessment: Report given to RN  Post vital signs: Reviewed  Last Vitals:  Vitals:   11/05/15 0720 11/05/15 0730  BP: 116/67 121/70  Pulse:    Resp: 16 18  Temp:      Last Pain:  Vitals:   11/05/15 0637  TempSrc: Oral      Patients Stated Pain Goal: 8 ([id number])  Complications: No apparent anesthesia complications

## 2015-11-05 NOTE — Brief Op Note (Signed)
11/05/2015  8:33 AM  PATIENT:  Angie Jennings  65 y.o. female  PRE-OPERATIVE DIAGNOSIS:  nuclear cataract left eye  POST-OPERATIVE DIAGNOSIS:  nuclear cataract left eye  PROCEDURE:  Procedure(s): CATARACT EXTRACTION PHACO AND INTRAOCULAR LENS PLACEMENT; CDE:  3.17  SURGEON:  Surgeon(s): Williams Che, MD  ASSISTANTS:  Cleda Clarks, CST   ANESTHESIA STAFF: Anesthesiologist: Lerry Liner, MD CRNA: Ollen Bowl, CRNA  ANESTHESIA:   topical and MAC  REQUESTED LENS POWER: 16.5  LENS IMPLANT INFORMATION: @ORIMPLANT @  CUMULATIVE DISSIPATED ENERGY:3.17  INDICATIONS:see scanned office H&P for details  OP FINDINGS:mod dense NS  COMPLICATIONS:None  DICTATION #: none  PLAN OF CARE: as above  PATIENT DISPOSITION:  Short Stay

## 2015-11-05 NOTE — Anesthesia Preprocedure Evaluation (Signed)
Anesthesia Evaluation  Patient identified by MRN, date of birth, ID band Patient awake    Reviewed: Allergy & Precautions, NPO status , Patient's Chart, lab work & pertinent test results  Airway Mallampati: II  TM Distance: >3 FB Neck ROM: Full    Dental no notable dental hx.    Pulmonary neg pulmonary ROS, asthma (inactive) ,    Pulmonary exam normal breath sounds clear to auscultation       Cardiovascular negative cardio ROS Normal cardiovascular exam Rhythm:Regular Rate:Normal     Neuro/Psych Anxiety negative neurological ROS  negative psych ROS   GI/Hepatic Neg liver ROS, GERD  Medicated and Controlled,  Endo/Other  negative endocrine ROS  Renal/GU negative Renal ROS  negative genitourinary   Musculoskeletal negative musculoskeletal ROS (+)   Abdominal   Peds negative pediatric ROS (+)  Hematology negative hematology ROS (+)   Anesthesia Other Findings   Reproductive/Obstetrics negative OB ROS                             Anesthesia Physical Anesthesia Plan  ASA: II  Anesthesia Plan: MAC   Post-op Pain Management:    Induction: Intravenous  Airway Management Planned: Nasal Cannula  Additional Equipment:   Intra-op Plan:   Post-operative Plan:   Informed Consent: I have reviewed the patients History and Physical, chart, labs and discussed the procedure including the risks, benefits and alternatives for the proposed anesthesia with the patient or authorized representative who has indicated his/her understanding and acceptance.     Plan Discussed with:   Anesthesia Plan Comments:         Anesthesia Quick Evaluation

## 2015-11-05 NOTE — H&P (Signed)
I have reviewed the pre printed H&P, the patient was re-examined, and I have identified no significant interval changes in the patient's medical condition.  There is no change in the plan of care since the history and physical of record. 

## 2015-11-05 NOTE — Anesthesia Postprocedure Evaluation (Signed)
Anesthesia Post Note  Patient: Angie Jennings  Procedure(s) Performed: Procedure(s) (LRB): CATARACT EXTRACTION PHACO AND INTRAOCULAR LENS PLACEMENT; CDE:  3.17 (Left)  Patient location during evaluation: Short Stay Anesthesia Type: MAC Level of consciousness: awake and alert Pain management: pain level controlled Vital Signs Assessment: post-procedure vital signs reviewed and stable Respiratory status: spontaneous breathing Cardiovascular status: blood pressure returned to baseline Postop Assessment: no signs of nausea or vomiting Anesthetic complications: no    Last Vitals:  Vitals:   11/05/15 0720 11/05/15 0730  BP: 116/67 121/70  Pulse:    Resp: 16 18  Temp:      Last Pain:  Vitals:   11/05/15 0637  TempSrc: Oral                 Grantham Hippert

## 2015-11-09 ENCOUNTER — Encounter (HOSPITAL_COMMUNITY): Payer: Self-pay | Admitting: Ophthalmology

## 2015-12-12 ENCOUNTER — Encounter (HOSPITAL_COMMUNITY): Payer: BC Managed Care – PPO | Attending: Hematology & Oncology | Admitting: Oncology

## 2015-12-12 VITALS — BP 104/75 | HR 60 | Resp 20 | Ht 67.5 in | Wt 133.5 lb

## 2015-12-12 DIAGNOSIS — Z23 Encounter for immunization: Secondary | ICD-10-CM | POA: Diagnosis not present

## 2015-12-12 DIAGNOSIS — C50912 Malignant neoplasm of unspecified site of left female breast: Secondary | ICD-10-CM

## 2015-12-12 DIAGNOSIS — Z171 Estrogen receptor negative status [ER-]: Secondary | ICD-10-CM

## 2015-12-12 DIAGNOSIS — D0512 Intraductal carcinoma in situ of left breast: Secondary | ICD-10-CM | POA: Diagnosis not present

## 2015-12-12 MED ORDER — INFLUENZA VAC SPLIT QUAD 0.5 ML IM SUSY
0.5000 mL | PREFILLED_SYRINGE | Freq: Once | INTRAMUSCULAR | Status: AC
  Administered 2015-12-12: 0.5 mL via INTRAMUSCULAR
  Filled 2015-12-12: qty 0.5

## 2015-12-12 NOTE — Progress Notes (Signed)
Angie Jennings, Angie Jennings [place] [address]  Need for prophylactic vaccination and inoculation against influenza  Malignant neoplasm of left breast in female, estrogen receptor negative, unspecified site of breast ([place])  CURRENT THERAPY: Surveillance per NCCN guidelines.  INTERVAL HISTORY: Angie Jennings 65 y.o. 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 Donne Hazel on 11/01/2014.  This is followed by reconstruction by Dr. Irene Limbo.    Breast cancer ([place])   09/27/2014 Mammogram    Left breast: new area of calcifications      10/05/2014 Breast US    Left breast: developing grouped linear calcifications following a ductal pattern spanning an area of 2.7 cm.      10/05/2014 Initial Biopsy    Left breast core needle bx: high grade DCIS, ER- (0%), PR- (0%)      10/12/2014 Breast MRI    Left breast: nonmass enhancement in LOQ greater than that visualized on mammography; contiguous enhancement posterior and medial to the biopsied calcifications which does not have a mammographic correlate.      10/12/2014 Clinical Stage    Stage 0: Tis N0      10/16/2014 Procedure    Genetic testing: Breast/Ovarian panel ([facility]) revealed no clinically significant variant at ATM, BARD1, BRCA1, BRCA2, BRIP1, CDH1, CHEK2, EPCAM, FANCC, MLH1, MSH2, MSH6, NBN, PALB2, PMS2, PTEN, RAD51C, RAD51D, TP53, and XRCC2.      11/01/2014 Definitive Surgery    Bilateral mastectomy / SLNB with immediate reconstruction.  RIGHT: negative for malignancy.  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 1 LN removed and negative (0/1)      11/01/2014 Pathologic Stage    Stage 0: Tis N0      11/30/2014 Survivorship    Survivorship visit completed and copy of care plan provided to patient      03/13/2015 Procedure    1. Removal bilateral  tissue expanders and placement silicone implants 2. Lipofilling to bilateral chest  SURGEON: Irene Limbo MD MBA       She is doing very well.  She denies any new complaints.  She is losing weight intentionally as she is involved in a family weight loss program/challange.  Her husband is also down ~20 lbs.  She denies any breast related complaints.  Review of Systems  Constitutional: Negative.  Negative for chills, fever and weight loss.  HENT: Negative.   Eyes: Negative.  Negative for blurred vision.  Respiratory: Negative.  Negative for cough.   Cardiovascular: Negative.  Negative for chest pain.  Gastrointestinal: Negative.  Negative for abdominal pain, nausea and vomiting.  Genitourinary: Negative.   Musculoskeletal: Negative.   Skin: Negative.   Neurological: Negative.  Negative for headaches.  Endo/Heme/Allergies: Negative.   Psychiatric/Behavioral: Negative.     Past Medical History:  Diagnosis Date  . Anxiety   . Arthritis    OA  . Breast cancer ([place]) 09/2014   ER-/PR- DCIS  . Childhood asthma   . GERD (gastroesophageal reflux disease)   . Radiation    as child for treatment of keloids on face and neck    Past Surgical History:  Procedure Laterality Date  . ABDOMINAL HYSTERECTOMY  2006  . back surgery  1995   fusion of L4 & L5  . BACK SURGERY     lumbar fusion  . bravo capsule endoscopy  05/02/09   GERD with adequate acid suppression  . BRAVO [place] STUDY  11/21/2011   Procedure: BRAVO [place];  Surgeon: Danie Binder, MD;  [facility];  Service: Endoscopy;  Laterality: N/A;  . BREAST BIOPSY  10/05/14  . BREAST RECONSTRUCTION WITH PLACEMENT OF TISSUE EXPANDER AND FLEX HD (ACELLULAR HYDRATED DERMIS) Bilateral 11/01/2014   Procedure: BILATERAL BREAST RECONSTRUCTION WITH PLACEMENT OF TISSUE EXPANDER AND FLEX HD (ACELLULAR HYDRATED DERMIS);  Surgeon: Irene Limbo, MD;  Location: [facility];  Service: Plastics;  Laterality:  Bilateral;  . BREAST SURGERY  AUGUST 2015   BREAST BIOPSY   . CATARACT EXTRACTION W/PHACO Right 07/23/2015   Procedure: CATARACT EXTRACTION PHACO AND INTRAOCULAR LENS PLACEMENT RIGHT EYE CDE=1.72;  Surgeon: Williams Che, MD;  Location: [facility];  Service: Ophthalmology;  Laterality: Right;  . CATARACT EXTRACTION W/PHACO Left 11/05/2015   Procedure: CATARACT EXTRACTION PHACO AND INTRAOCULAR LENS PLACEMENT; CDE:  3.17;  Surgeon: Williams Che, MD;  Location: [facility];  Service: Ophthalmology;  Laterality: Left;  . COLONOSCOPY  11/21/2011  . ESOPHAGOGASTRODUODENOSCOPY  04/20/09   mild gastritis  . ESOPHAGOGASTRODUODENOSCOPY  07/27/09   small internal hemorrhoids/rare sigmoid colon diverticula other wise no polyps  . ESOPHAGOGASTRODUODENOSCOPY  11/21/2011   SLF: 1. the mucosa of the esophagus appeared normal 2. NOn-erosive gastritis (inflammation) was found multiple biopsies 3. the duodenal mucosa showed no abnormalitis.   [name] Kitchen LIPOSUCTION WITH LIPOFILLING Bilateral 03/13/2015   Procedure: LIPO FILLING TO BILATERAL BREAST FROM ABDOMEN;  Surgeon: Irene Limbo, MD;  Location: [facility];  Service: Plastics;  Laterality: Bilateral;  . NIPPLE SPARING MASTECTOMY/SENTINAL LYMPH NODE BIOPSY/RECONSTRUCTION/PLACEMENT OF TISSUE EXPANDER Bilateral 11/01/2014   Procedure: LEFT NIPPLE SPARING MASTECTOMY WITH LEFT  SENTINAL LYMPH NODE BIOPSY AND RIGHT PROPYLACTIC NIPPLE SPARING MASTECTOMY;  Surgeon: Rolm Bookbinder, MD;  Location: [facility];  Service: General;  Laterality: Bilateral;  . REMOVAL OF BILATERAL TISSUE EXPANDERS WITH PLACEMENT OF BILATERAL BREAST IMPLANTS Bilateral 03/13/2015   Procedure: REMOVAL OF BILATERAL TISSUE EXPANDERS WITH PLACEMENT OF BILATERAL BREAST IMPLANTS;  Surgeon: Irene Limbo, MD;  Location: [facility];  Service: Plastics;  Laterality: Bilateral;    Family History  Problem Relation Age of Onset  . Dementia Mother   . Esophageal cancer  Mother 67  . Hypertension Father   . Lung cancer Father 26    smoker  . Prostate cancer Brother 13  . Dementia Maternal Aunt   . Bladder Cancer Maternal Uncle 62    former smoker  . Heart attack Maternal Grandmother   . Dementia Maternal Grandfather   . Esophageal cancer Maternal Aunt 72    non-smoker  . Brain cancer Maternal Uncle 63    Meningioma - benign  . Esophageal cancer Maternal Uncle 61    Social History   Social History  . Marital status: Married    Spouse name: N/A  . Number of children: 3  . Years of education: N/A   Social History Main Topics  . Smoking status: Never Smoker  . Smokeless tobacco: Never Used  . Alcohol use 0.0 oz/week     Comment: social  . Drug use: No  . Sexual activity: Yes   Other Topics Concern  . Not on file   Social History Narrative  . No narrative on file     PHYSICAL EXAMINATION  ECOG PERFORMANCE STATUS: 0 - Asymptomatic  Vitals:   12/12/15 1300  BP: 104/75  Pulse: 60  Resp: 20  GENERAL:alert, no distress, well nourished, well developed, comfortable, cooperative, smiling and unaccomapnied SKIN: skin color, texture, turgor are normal, no rashes or significant lesions HEAD: Normocephalic, No masses, lesions, tenderness or abnormalities EYES: normal, EOMI, Conjunctiva are pink and non-injected EARS: External ears normal OROPHARYNX:lips, buccal mucosa, and tongue normal and mucous membranes are moist  NECK: supple, no adenopathy, trachea midline LYMPH:  no palpable lymphadenopathy BREAST:bilateral implants, no palpable abnormalities otherwise LUNGS: clear to auscultation and percussion HEART: regular rate & rhythm, no murmurs, no gallops, S1 normal and S2 normal ABDOMEN:abdomen soft, non-tender and normal bowel sounds BACK: Back symmetric, no curvature., No CVA tenderness EXTREMITIES:less then 2 second capillary refill, no joint deformities, effusion, or inflammation, no edema, no skin discoloration, no clubbing, no  cyanosis  NEURO: alert & oriented x 3 with fluent speech, no focal motor/sensory deficits, gait normal   LABORATORY DATA: CBC    Component Value Date/Time   WBC 7.4 10/30/2015 1130   RBC 5.00 10/30/2015 1130   HGB 15.1 (H) 10/30/2015 1130   HCT 45.1 10/30/2015 1130   PLT 208 10/30/2015 1130   MCV 90.2 10/30/2015 1130   MCH 30.2 10/30/2015 1130   MCHC 33.5 10/30/2015 1130   RDW 13.7 10/30/2015 1130   LYMPHSABS 2.3 10/27/2014 1145   MONOABS 0.8 10/27/2014 1145   EOSABS 0.2 10/27/2014 1145   BASOSABS 0.0 10/27/2014 1145      Chemistry      Component Value Date/Time   NA 139 10/30/2015 1130   K 4.9 10/30/2015 1130   CL 104 10/30/2015 1130   CO2 25 10/30/2015 1130   BUN 16 10/30/2015 1130   CREATININE 0.92 10/30/2015 1130      Component Value Date/Time   CALCIUM 9.6 10/30/2015 1130   ALKPHOS 108 04/20/2009 0500   AST 40 (H) 04/20/2009 0500   ALT 31 04/20/2009 0500   BILITOT 0.8 04/20/2009 0500        PENDING LABS:   RADIOGRAPHIC STUDIES:  No results found.   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 Donne Hazel on 11/01/2014.  This is followed by reconstruction by Dr. Irene Limbo.  Oncology history is updated.  No oncology role for labs today.  Chart is reviewed.  She is S/P cataract extraction and intraocular lens placement by Dr. Iona Hansen on 11/05/2015.  She has also seen Dr. Oneida Alar for her ongoing GERD management.  She is reminded of the following information: I discussed the overall thoughts of vaginal estrogen use in breast cancer patients. Her major complaint is recurrent/frequent UTI's and not necessarily vaginal dryness. Typically in patients with ER+ breast cancer we can use vaginal estrogen (it has been shown to have minimal systemic absorption but does have some) but those patients are typically on  tamoxifen. Her DCIS was ER-.   From up to date: There are few data regarding the safety of vaginal estrogen therapy in women with breast cancer. No increase in the risk of recurrence was found in a prospective cohort study of 19 women with breast cancer included 37 women treated with vaginal estrogen for an average of one year (range 0.1 to 5 years). The study did not report recurrence rates by stage or hormone receptor status  It is reasonable for her to use low dose therapy but I advised her to use it only 3 to 4 times weekly.  She  has weight loss, ~ 20 lbs, but this is intentional.  Her family is involved in a weight loss challenge.  She looks great.  Influenza vaccine today.  She will return in 12 months for follow-up with ongoing follow-up with Dr. Donne Hazel in March 2018 (per patient).   ORDERS PLACED FOR THIS ENCOUNTER: No orders of the defined types were placed in this encounter.   MEDICATIONS PRESCRIBED THIS ENCOUNTER: Meds ordered this encounter  Medications  . Influenza vac split quadrivalent PF (FLUARIX) injection 0.5 mL    THERAPY PLAN:  NCCN guidelines recommends the following surveillance for DCIS of breast post-operatively (2.2017):  1. Interval history and physical exam every 6-12 months for 5 years, then annually.  2. Mammogram every 12 months (and 6-12 months postradiation therapy if breast conserved Category 2B).  3. If treated with Tamoxifen, monitor per NCCN guidelines for Breast Cancer Risk Reduction.  Risk reduction therapy for ipsilateral breast folling breast-conserving surgery (2.2017):  1. Consider endocrine therapy for 5 years for:   A. Patients treated with breast conserving therapy (lumpectomy) and radiation therapy (category 1), especially for those with ER positive DCIS.   B. Benefit of endocrine therapy for ER negative DCIS is uncertain.   C. Patients treated with excision alone.  2. Endocrine therapy:   A. Tamoxifen for premenopausal  patients.   B. Tamoxifen or aromatase inhibitor for postmenopausal patients with some advantage for aromatase inhibitor therapy in patients less than 51 years old or with concerns for thromboembolism  3. Risk reduction therapy for contralateral breast:   A. Counseling regarding risk reduction. See NCCN guidelines for breast cancer risk reduction.   All questions were answered. The patient knows to call the clinic with any problems, questions or concerns. We can certainly see the patient much sooner if necessary.  Patient and plan discussed with Dr. Ancil Linsey and she is in agreement with the aforementioned.   This note is electronically signed by: Robynn Pane, PA-C 12/12/2015 6:00 PM

## 2015-12-12 NOTE — Progress Notes (Signed)
Pt given flu shot. Pt tolerated well. Pt stable and discharged home ambulatory.   

## 2015-12-12 NOTE — Patient Instructions (Addendum)
[facility] at [facility] Discharge Instructions  RECOMMENDATIONS MADE BY THE CONSULTANT AND ANY TEST RESULTS WILL BE SENT TO YOUR REFERRING PHYSICIAN.  You were seen today by Kirby Crigler PA-C. Flu shot was given. Follow up in 1 year with Dr. Whitney Muse   Thank you for choosing [facility] at [facility] to provide your oncology and hematology care.  To afford each patient quality time with our provider, please arrive at least 15 minutes before your scheduled appointment time.   Beginning January 23rd 2017 lab work for the [facility] will be done in the  Main lab at [facility] on 1st floor. If you have a lab appointment with the [facility] please come in thru the  Main Entrance and check in at the main information desk  You need to re-schedule your appointment should you arrive 10 or more minutes late.  We strive to give you quality time with our providers, and arriving late affects you and other patients whose appointments are after yours.  Also, if you no show three or more times for appointments you may be dismissed from the clinic at the providers discretion.     Again, thank you for choosing [facility].  Our hope is that these requests will decrease the amount of time that you wait before being seen by our physicians.       _____________________________________________________________  Should you have questions after your visit to [facility], please contact our office at [phone] between the hours of 8:30 a.m. and 4:30 p.m.  Voicemails left after 4:30 p.m. will not be returned until the following business day.  For prescription refill requests, have your pharmacy contact our office.         Resources For Cancer Patients and their Caregivers ? American Cancer Society: Can assist with transportation, wigs, general needs, runs Look Good Feel Better.        947-409-9831 ? Cancer Care: Provides  financial assistance, online support groups, medication/co-pay assistance.  1-800-813-HOPE (902)698-0919) ? [place] Assists [place] cancer patients and their families through emotional , educational and financial support.  760-281-0861 ? [place] DSS Where to apply for food stamps, Medicaid and utility assistance. 272-477-4741 ? [facility]: Transportation to medical appointments. (562)670-8920 ? Social Security Administration: May apply for disability if have a Stage IV cancer. 430 678 8472 6054402842 ? [employer], Disability and Transit Services: Assists with nutrition, care and transit needs. [place] Support Programs: @10RELATIVEDAYS @ > Cancer Support Group  2nd Tuesday of the month 1pm-2pm, Journey Room  > Creative Journey  3rd Tuesday of the month 1130am-1pm, Journey Room  > Look Good Feel Better  1st Wednesday of the month 10am-12 noon, Journey Room (Call [facility] to register [phone])

## 2015-12-12 NOTE — Assessment & Plan Note (Addendum)
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 Donne Hazel on 11/01/2014.  This is followed by reconstruction by Dr. Irene Limbo.  Oncology history is updated.  No oncology role for labs today.  Chart is reviewed.  She is S/P cataract extraction and intraocular lens placement by Dr. Iona Hansen on 11/05/2015.  She has also seen Dr. Oneida Alar for her ongoing GERD management.  She is reminded of the following information: I discussed the overall thoughts of vaginal estrogen use in breast cancer patients. Her major complaint is recurrent/frequent UTI's and not necessarily vaginal dryness. Typically in patients with ER+ breast cancer we can use vaginal estrogen (it has been shown to have minimal systemic absorption but does have some) but those patients are typically on tamoxifen. Her DCIS was ER-.   From up to date: There are few data regarding the safety of vaginal estrogen therapy in women with breast cancer. No increase in the risk of recurrence was found in a prospective cohort study of 81 women with breast cancer included 55 women treated with vaginal estrogen for an average of one year (range 0.1 to 5 years). The study did not report recurrence rates by stage or hormone receptor status  It is reasonable for her to use low dose therapy but I advised her to use it only 3 to 4 times weekly.  She has weight loss, ~ 20 lbs, but this is intentional.  Her family is involved in a weight loss challenge.  She looks great.  Influenza vaccine today.  She will return in 12 months for follow-up with ongoing follow-up with Dr. Donne Hazel in March 2018 (per patient).

## 2016-02-21 ENCOUNTER — Telehealth: Payer: Self-pay | Admitting: Obstetrics and Gynecology

## 2016-02-21 MED ORDER — AZITHROMYCIN 250 MG PO TABS: ORAL_TABLET | ORAL | 1 refills | Status: DC

## 2016-02-21 NOTE — Telephone Encounter (Signed)
Patient with persistent cough and pharyngeal irritation x 5 days, progressing now to bloody nasal discharge. Low grade fever last night. No respiratory difficulties. Had flu shot earlier this fall. IMP: Pharyngitis, worsening. PLan: Rx Azithromycin, has antitussive.           To Pcmd if unimproved 48 hours..           Humidifier.

## 2016-03-07 ENCOUNTER — Other Ambulatory Visit: Payer: Self-pay | Admitting: Gynecology

## 2016-03-25 ENCOUNTER — Encounter: Payer: Self-pay | Admitting: Gastroenterology

## 2016-04-14 ENCOUNTER — Telehealth: Payer: Self-pay | Admitting: *Deleted

## 2016-04-14 MED ORDER — VENLAFAXINE HCL 37.5 MG PO TABS: 37.5000 mg | ORAL_TABLET | Freq: Every day | ORAL | 0 refills | Status: DC

## 2016-04-14 NOTE — Telephone Encounter (Signed)
Yes: One-month supply with 11 refills

## 2016-04-14 NOTE — Telephone Encounter (Signed)
Rx sent with only 1 month supply, since pt is coming on 04/22/16

## 2016-04-14 NOTE — Telephone Encounter (Signed)
Pt has annual scheduled on 04/22/16 needs refill on Effexor 37.5 mg. Okay to refill?

## 2016-04-22 ENCOUNTER — Ambulatory Visit (INDEPENDENT_AMBULATORY_CARE_PROVIDER_SITE_OTHER): Payer: BC Managed Care – PPO | Admitting: Gynecology

## 2016-04-22 ENCOUNTER — Encounter: Payer: Self-pay | Admitting: Gynecology

## 2016-04-22 ENCOUNTER — Telehealth: Payer: Self-pay | Admitting: *Deleted

## 2016-04-22 VITALS — BP 128/80 | Ht 67.0 in | Wt 140.0 lb

## 2016-04-22 DIAGNOSIS — M8580 Other specified disorders of bone density and structure, unspecified site: Secondary | ICD-10-CM

## 2016-04-22 DIAGNOSIS — Z853 Personal history of malignant neoplasm of breast: Secondary | ICD-10-CM | POA: Diagnosis not present

## 2016-04-22 DIAGNOSIS — N63 Unspecified lump in unspecified breast: Secondary | ICD-10-CM

## 2016-04-22 DIAGNOSIS — Z01411 Encounter for gynecological examination (general) (routine) with abnormal findings: Secondary | ICD-10-CM

## 2016-04-22 MED ORDER — NONFORMULARY OR COMPOUNDED ITEM: 4 refills | Status: DC

## 2016-04-22 MED ORDER — VENLAFAXINE HCL 37.5 MG PO TABS: 37.5000 mg | ORAL_TABLET | Freq: Every day | ORAL | 11 refills | Status: DC

## 2016-04-22 NOTE — Telephone Encounter (Signed)
Orders placed pt scheduled on 05/07/15 @ 12:45 at breast center left message for pt calll

## 2016-04-22 NOTE — Progress Notes (Signed)
Angie Jennings [birth year] [id number]   History:    66 y.o.  for annual gyn exam with no complaints today. Review of patient's record indicated the following: Patient has been followed by the medical oncologist as well as the general surgeon because of her history of ductal carcinoma in situ. Her history as described by her medical oncologist Dr. 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 ER negative, PR negative Abnormal screening mammogram of the left breast on 09/14/2013 Biopsy of the left breast on 09/26/2013 showing benign breast tissue, negative for atypia or malignancy, calcifications identified R prophylactic nipple sparing mastectomy, L nipple sparing mastectomy with L axillary sentinel node biopsy 11/01/2014 Dr. Donne Hazel Bilateral breast reconstruction with placement of tissue expanders 11/01/2014 Dr. Iran Planas  Patient stated she had discussed with her medical oncologist that she's had no problem with her using vaginal estrogen twice a week. Patient did not require any chemoradiation therapy. Patient's last bone density study in 2015 demonstrated the lowest T score was -1.8 at the right femoral neck with a normal Frax analysis. Patient had a colonoscopy in 2013 which was negative and she is on a 10 year recall. Patient does have family history of esophageal cancer who are smokers and every 5 years is having an EGD. Patient prior to her hysterectomy did not have any history of any abnormal Pap smears. Patient had a total abdominal hysterectomy with bilateral salpingo-oophorectomy in 2006.   Past medical history,surgical history, family history and social history were all reviewed and documented in the EPIC chart.  Gynecologic History No LMP recorded. Patient has had a hysterectomy. Contraception: status post hysterectomy Last Pap:  2015. Results were: normal Last mammogram: 2016. Results were: See above  Obstetric History OB History  Gravida Para Term Preterm AB Living  5 3     2 3   SAB TAB Ectopic Multiple Live Births  2            # Outcome Date GA Lbr Len/2nd Weight Sex Delivery Anes PTL Lv  5 SAB           4 SAB           3 Para           2 Para           1 Para                ROS: A ROS was performed and pertinent positives and negatives are included in the history.  GENERAL: No fevers or chills. HEENT: No change in vision, no earache, sore throat or sinus congestion. NECK: No pain or stiffness. CARDIOVASCULAR: No chest pain or pressure. No palpitations. PULMONARY: No shortness of breath, cough or wheeze. GASTROINTESTINAL: No abdominal pain, nausea, vomiting or diarrhea, melena or bright red blood per rectum. GENITOURINARY: No urinary frequency, urgency, hesitancy or dysuria. MUSCULOSKELETAL: No joint or muscle pain, no back pain, no recent trauma. DERMATOLOGIC: No rash, no itching, no lesions. ENDOCRINE: No polyuria, polydipsia, no heat or cold intolerance. No recent change in weight. HEMATOLOGICAL: No anemia or easy bruising or bleeding. NEUROLOGIC: No headache, seizures, numbness, tingling or weakness. PSYCHIATRIC: No depression, no loss of interest in normal activity or change in sleep pattern.     Exam: chaperone present  BP 128/80   Ht 5\' 7"  (1.702 m)   Wt 140 lb (63.5 kg)  BMI 21.93 kg/m   Body mass index is 21.93 kg/m.  General appearance : Well developed well nourished female. No acute distress HEENT: Eyes: no retinal hemorrhage or exudates,  Neck supple, trachea midline, no carotid bruits, no thyroidmegaly Lungs: Clear to auscultation, no rhonchi or wheezes, or rib retractions  Heart: Regular rate and rhythm, no murmurs or gallops Breast:Examined in sitting and supine position were symmetrical in appearance, no palpable masses or tenderness, on left breast no skin retraction, no nipple  inversion, no nipple discharge, no skin discoloration, no axillary or supraclavicular lymphadenopathy right breast nodularity noted 2 cm in size firm 4 finger breast from the areolar region. Abdomen: no palpable masses or tenderness, no rebound or guarding Extremities: no edema or skin discoloration or tenderness  Pelvic:  Bartholin, Urethra, Skene Glands: Within normal limits             Vagina: No gross lesions or discharge  Cervix: Absent  Uterus  absent  Adnexa  Without masses or tenderness  Anus and perineum  normal   Rectovaginal  normal sphincter tone without palpated masses or tenderness             Hemoccult cards provided     Assessment/Plan:  66 y.o. female for annual exam with past history of breast cancer as described above. Incidental finding of right breast nodularity. I spoke with the radiologist the he would recommend starting off with an ultrasound of the area first and decide of the biopsy and/or MRI indicated after the ultrasound. Patient also to schedule here in the office for bone density study in the next few weeks. Her PCP has been doing her blood work her vaccines are up-to-date. She no longer needs Pap smear. Prescription refill for vaginal estrogen to use twice a week for vaginal atrophy provided as well as refill for her Effexor. We discussed importance of calcium vitamin D and weightbearing exercise for osteoporosis prevention.   Terrance Mass MD, 3:00 PM 04/22/2016

## 2016-04-22 NOTE — Telephone Encounter (Signed)
-----   Message from Terrance Mass, MD sent at 04/22/2016  3:11 PM EST ----- Anderson Malta, please make an ultrasound appointment for this patient had the breast center. Patient with history of ductal carcinoma in situ has had bilateral mastectomy and silicon implants was found to have a nodular area right breast 4 finger breast from the areolar region. I spoke with the radiologist he would recommend starting off with an ultrasound.

## 2016-04-24 NOTE — Telephone Encounter (Signed)
Pt informed with time and date.  

## 2016-04-29 ENCOUNTER — Ambulatory Visit (INDEPENDENT_AMBULATORY_CARE_PROVIDER_SITE_OTHER): Payer: BC Managed Care – PPO

## 2016-04-29 ENCOUNTER — Other Ambulatory Visit: Payer: Self-pay | Admitting: Gynecology

## 2016-04-29 DIAGNOSIS — Z13820 Encounter for screening for osteoporosis: Secondary | ICD-10-CM

## 2016-04-29 DIAGNOSIS — M8580 Other specified disorders of bone density and structure, unspecified site: Secondary | ICD-10-CM

## 2016-04-29 DIAGNOSIS — Z780 Asymptomatic menopausal state: Secondary | ICD-10-CM

## 2016-04-29 DIAGNOSIS — M8588 Other specified disorders of bone density and structure, other site: Secondary | ICD-10-CM

## 2016-05-06 ENCOUNTER — Ambulatory Visit
Admission: RE | Admit: 2016-05-06 | Discharge: 2016-05-06 | Disposition: A | Discharge status: Home / Self Care | Payer: BC Managed Care – PPO | Source: Ambulatory Visit | Attending: Gynecology | Admitting: Gynecology

## 2016-05-06 DIAGNOSIS — N63 Unspecified lump in unspecified breast: Secondary | ICD-10-CM

## 2016-05-29 ENCOUNTER — Encounter: Payer: Self-pay | Admitting: Gastroenterology

## 2016-05-29 ENCOUNTER — Ambulatory Visit (INDEPENDENT_AMBULATORY_CARE_PROVIDER_SITE_OTHER): Payer: BC Managed Care – PPO | Admitting: Gastroenterology

## 2016-05-29 VITALS — BP 127/76 | HR 82 | Temp 97.9°F | Ht 67.5 in | Wt 142.0 lb

## 2016-05-29 DIAGNOSIS — Z1159 Encounter for screening for other viral diseases: Secondary | ICD-10-CM | POA: Diagnosis not present

## 2016-05-29 DIAGNOSIS — K219 Gastro-esophageal reflux disease without esophagitis: Secondary | ICD-10-CM | POA: Diagnosis not present

## 2016-05-29 DIAGNOSIS — Z1211 Encounter for screening for malignant neoplasm of colon: Secondary | ICD-10-CM

## 2016-05-29 LAB — HEPATITIS B SURFACE ANTIBODY,QUALITATIVE: HEP B S AB: NEGATIVE

## 2016-05-29 LAB — HEPATITIS A ANTIBODY, TOTAL: HEP A TOTAL AB: NONREACTIVE

## 2016-05-29 LAB — HEPATITIS C ANTIBODY: HCV Ab: NEGATIVE

## 2016-05-29 MED ORDER — RABEPRAZOLE SODIUM 20 MG PO TBEC: 20.0000 mg | DELAYED_RELEASE_TABLET | Freq: Every day | ORAL | 3 refills | Status: DC

## 2016-05-29 NOTE — Assessment & Plan Note (Addendum)
SYMPTOMS CONTROLLED/RESOLVED ON ACIPHEX 10 MG DAILY. LAST EGD 2013: NO BARETT'S.  ACIPHEX 10-20 MG DAILY.  AVOID REFLUX TRIGGERS DRINK WATER EAT FIBER NO INDICATION FOR ENDOSCOPY AT THIS TIME. FOLLOW UP IN 1 YEAR.

## 2016-05-29 NOTE — Assessment & Plan Note (Addendum)
AVERAGE RISK-NEXT COLONOSCOPY JUN 2011.  NO BLOOD TRANSFUION OR TATTOES. SCREEN FOR HEP  [facility].

## 2016-05-29 NOTE — Patient Instructions (Signed)
COMPLETE LABS.  TAKE ACIPHEX DAILY.  AVOID REFLUX TRIGGERS. SEE INFO BELOW.  FOLLOW UP IN 1 YEAR.     Lifestyle and home remedies TO CONTROL HEARTBURN You may eliminate or reduce the frequency of heartburn by making the following lifestyle changes:  . Control your weight. Being overweight is a major risk factor for heartburn and GERD. Excess pounds put pressure on your abdomen, pushing up your stomach and causing acid to back up into your esophagus.   . Eat smaller meals. 4 TO 6 MEALS A DAY. This reduces pressure on the lower esophageal sphincter, helping to prevent the valve from opening and acid from washing back into your esophagus.   [name] your belt. Clothes that fit tightly around your waist put pressure on your abdomen and the lower esophageal sphincter.  . Eliminate heartburn triggers. Everyone has specific triggers.  .  Common triggers such as fatty or fried foods, spicy food, tomato sauce, carbonated beverages, alcohol, chocolate, mint, garlic, onion, caffeine and nicotine may make heartburn worse.   [name] Kitchen Avoid stooping or bending. Tying your shoes is OK. Bending over for longer periods to weed your garden isn't, especially soon after eating.   . Don't lie down after a meal. Wait at least three to four hours after eating before going to bed, and don't lie down right after eating.   Alternative medicine . Several home remedies exist for treating GERD, but they provide only temporary relief. They include drinking baking soda (sodium bicarbonate) added to water or drinking other fluids such as baking soda mixed with cream of tartar and water. . Although these liquids create temporary relief by neutralizing, washing away or buffering acids, eventually they aggravate the situation by adding gas and fluid to your stomach, increasing pressure and causing more acid reflux. Further, adding more sodium to your diet may increase your blood pressure and add stress to your heart, and  excessive bicarbonate ingestion can alter the acid-base balance in your body.

## 2016-05-29 NOTE — Progress Notes (Signed)
CC'D TO PCP °

## 2016-05-29 NOTE — Progress Notes (Signed)
ON RECALL  °

## 2016-05-29 NOTE — Progress Notes (Signed)
Subjective:    Patient ID: Angie Jennings, female    DOB: 06-Oct-1950, 66 y.o.   MRN: [id number]   Angie Noble, MD  HPI Lost weight to 135 lbs. NOW TRYING TO KEEP LAST 5-7 LBS IS A STRUGGLE. NOT GOING TO GYM CONSISTENTLY. HEARTBURN CONTROLLED WITH ACIPHEX 10 MG DAILY. APPETITE: TOO GOOD. ENERGY LEVEL: VARIES.   PT DENIES FEVER, CHILLS, HEMATOCHEZIA, HEMATEMESIS, nausea, vomiting, melena, diarrhea, CHEST PAIN, SHORTNESS OF BREATH,  CHANGE IN BOWEL IN HABITS, constipation, abdominal pain, problems swallowing, problems with sedation, heartburn or indigestion.  Past Medical History:  Diagnosis Date  . Anxiety   . Arthritis    OA  . 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 Childhood asthma   . GERD (gastroesophageal reflux disease)   . Radiation    as child for treatment of keloids on face and neck   Past Surgical History:  Procedure Laterality Date  . ABDOMINAL HYSTERECTOMY  2006  . back surgery  1995   fusion of L4 & L5  . BACK SURGERY     lumbar fusion  . bravo capsule endoscopy  05/02/09   GERD with adequate acid suppression  . BRAVO [place] STUDY  11/21/2011   Procedure: BRAVO [place];  Surgeon: Danie Binder, MD;  [facility];  Service: Endoscopy;  Laterality: N/A;  . BREAST BIOPSY  10/05/14  . BREAST RECONSTRUCTION WITH PLACEMENT OF TISSUE EXPANDER AND FLEX HD (ACELLULAR HYDRATED DERMIS) Bilateral 11/01/2014   Procedure: BILATERAL BREAST RECONSTRUCTION WITH PLACEMENT OF TISSUE EXPANDER AND FLEX HD (ACELLULAR HYDRATED DERMIS);  Surgeon: Irene Limbo, MD;  Location: [facility];  Service: Plastics;  Laterality: Bilateral;  . BREAST SURGERY  AUGUST 2015   BREAST BIOPSY   . CATARACT EXTRACTION W/PHACO Right 07/23/2015   Procedure: CATARACT EXTRACTION PHACO AND INTRAOCULAR LENS PLACEMENT RIGHT EYE CDE=1.72;  Surgeon: Williams Che, MD;  Location: [facility];  Service: Ophthalmology;  Laterality: Right;  . CATARACT EXTRACTION W/PHACO Left 11/05/2015   Procedure: CATARACT EXTRACTION PHACO AND INTRAOCULAR LENS PLACEMENT; CDE:  3.17;  Surgeon: Williams Che, MD;  [facility];  Service: Ophthalmology;  Laterality: Left;  . COLONOSCOPY  11/21/2011  . ESOPHAGOGASTRODUODENOSCOPY  04/20/09   mild gastritis  . ESOPHAGOGASTRODUODENOSCOPY  07/27/09   small internal hemorrhoids/rare sigmoid colon diverticula other wise no polyps  . ESOPHAGOGASTRODUODENOSCOPY  11/21/2011   SLF: 1. the mucosa of the esophagus appeared normal 2. NOn-erosive gastritis (inflammation) was found multiple biopsies 3. the duodenal mucosa showed no abnormalitis.   [name] Kitchen LIPOSUCTION WITH LIPOFILLING Bilateral 03/13/2015   Procedure: LIPO FILLING TO BILATERAL BREAST FROM ABDOMEN;  Surgeon: Irene Limbo, MD;  Location: [facility];  Service: Plastics;  Laterality: Bilateral;  . NIPPLE SPARING MASTECTOMY/SENTINAL LYMPH NODE BIOPSY/RECONSTRUCTION/PLACEMENT OF TISSUE EXPANDER Bilateral 11/01/2014   Procedure: LEFT NIPPLE SPARING MASTECTOMY WITH LEFT  SENTINAL LYMPH NODE BIOPSY AND RIGHT PROPYLACTIC NIPPLE SPARING MASTECTOMY;  Surgeon: Rolm Bookbinder, MD;  Location: [facility];  Service: General;  Laterality: Bilateral;  . REMOVAL OF BILATERAL TISSUE EXPANDERS WITH PLACEMENT OF BILATERAL BREAST IMPLANTS Bilateral 03/13/2015   Procedure: REMOVAL OF BILATERAL TISSUE EXPANDERS WITH PLACEMENT OF BILATERAL BREAST IMPLANTS;  Surgeon: Irene Limbo, MD;  Location: [facility];  Service: Plastics;  Laterality: Bilateral;   Allergies  Allergen Reactions  . Aspirin Other (See Comments)    Pt. states ASA makes her bleed.   . Ioxaglate Hives  . Ivp Dye [Iodinated Diagnostic Agents]  Hives  . Kapidex [Dexlansoprazole] Diarrhea  . Lidocaine Nausea Only    Feels hot and flushing  . Metronidazole   . Nsaids   . Clarithromycin Rash   Current Outpatient Prescriptions  Medication Sig Dispense Refill  . acetaminophen (TYLENOL) 500 MG tablet Take 500  mg by mouth every 6 (six) hours as needed. Reported on 05/09/2015    . Calcium Citrate-Vitamin D (CITRACAL PETITES/VITAMIN D PO) Take 2 tablets by mouth daily.    . NONFORMULARY OR COMPOUNDED ITEM Estradiol 0.02 % 39ml prefilled applicator Sig: apply twice a week    . RABEprazole Sodium 10 MG CPSP Take 1 tablet by mouth daily. Reported on 05/09/2015    . venlafaxine (EFFEXOR) 37.5 MG tablet Take 1 tablet (37.5 mg total) by mouth daily.     Review of Systems PER HPI OTHERWISE ALL SYSTEMS ARE NEGATIVE.    Objective:   Physical Exam  Constitutional: She is oriented to person, place, and time. She appears well-developed and well-nourished. No distress.  HENT:  Head: Normocephalic and atraumatic.  Mouth/Throat: Oropharynx is clear and moist. No oropharyngeal exudate.  Eyes: Pupils are equal, round, and reactive to light. No scleral icterus.  Neck: Normal range of motion. Neck supple.  Cardiovascular: Normal rate, regular rhythm and normal heart sounds.   Pulmonary/Chest: Effort normal and breath sounds normal. No respiratory distress.  Abdominal: Soft. Bowel sounds are normal. She exhibits no distension. There is no tenderness.  Musculoskeletal: She exhibits no edema.  Lymphadenopathy:    She has no cervical adenopathy.  Neurological: She is alert and oriented to person, place, and time.  Psychiatric: She has a normal mood and affect.  Vitals reviewed.     Assessment & Plan:

## 2016-05-30 NOTE — Progress Notes (Signed)
Results information sent via MY Chart!

## 2016-07-02 ENCOUNTER — Encounter: Payer: Self-pay | Admitting: Gynecology

## 2016-09-13 IMAGING — CT CT RENAL STONE PROTOCOL
2 of 4 series · 16 of 46 positions shown, 18 images · non-contrast
Comparison: Abdomen ultrasound 04/20/2009.

CLINICAL DATA: 63-year-old female with severe right flank pain
since yesterday. Initial encounter.

EXAM:
CT ABDOMEN AND PELVIS WITHOUT CONTRAST
TECHNIQUE: Multidetector CT imaging of the abdomen and pelvis was performed
following the standard protocol without IV contrast.

[Series 2: standard/full over (age)lbs 5.0 · axial · 0.62mm/px · z∈[-488,-48]mm · 13 of 98 slices shown, 15 images]
[im 5/98  soft-tissue]
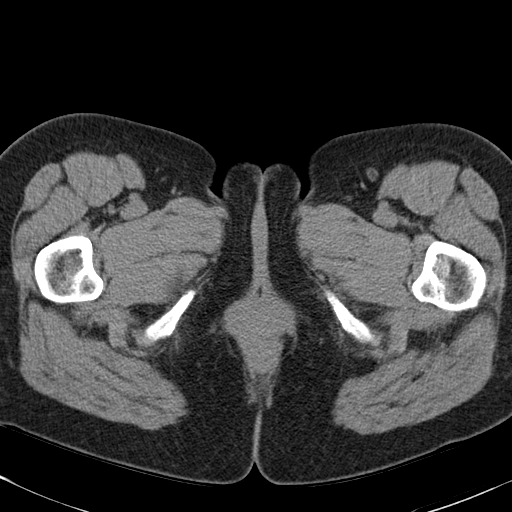
[im 5/98  bone]
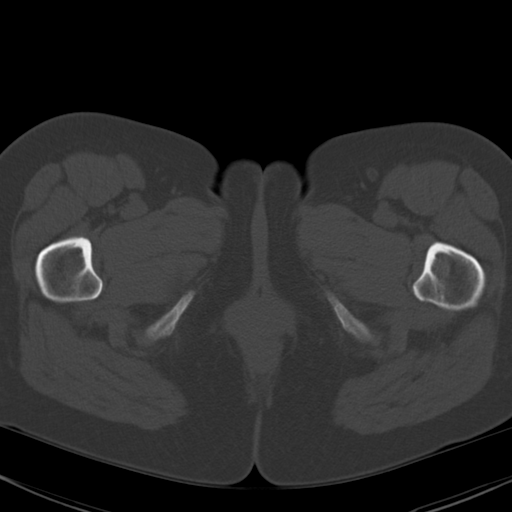
[im 13/98  soft-tissue]
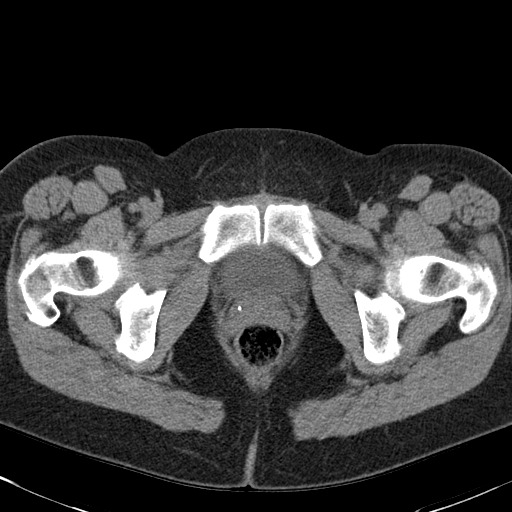
[im 22/98  soft-tissue]
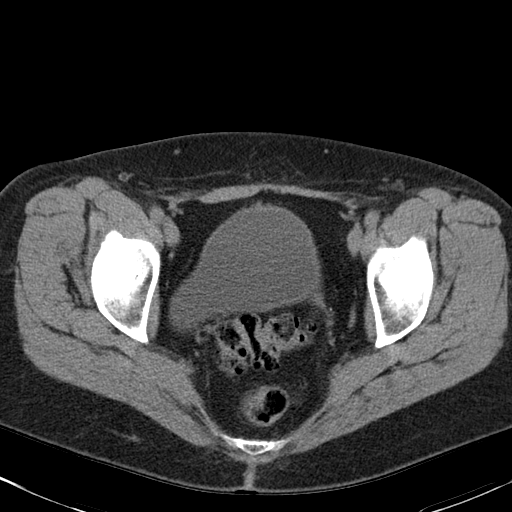
[im 26/98  soft-tissue]
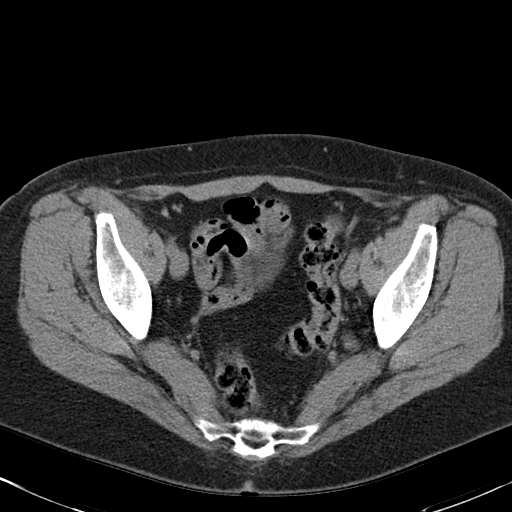
[im 34/98  soft-tissue]
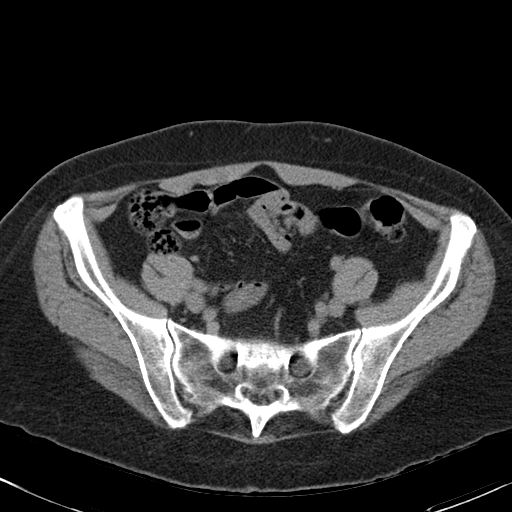
[im 43/98  soft-tissue]
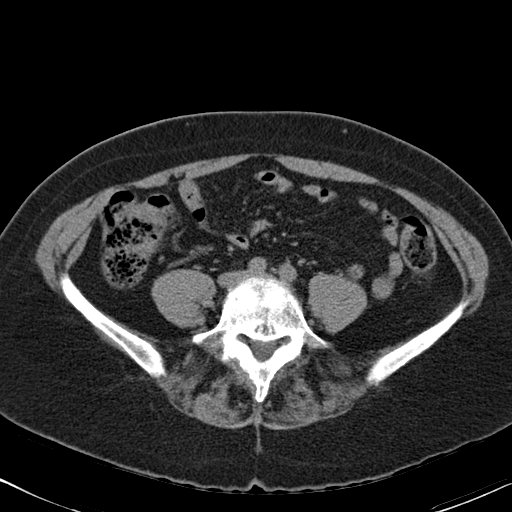
[im 51/98  soft-tissue]
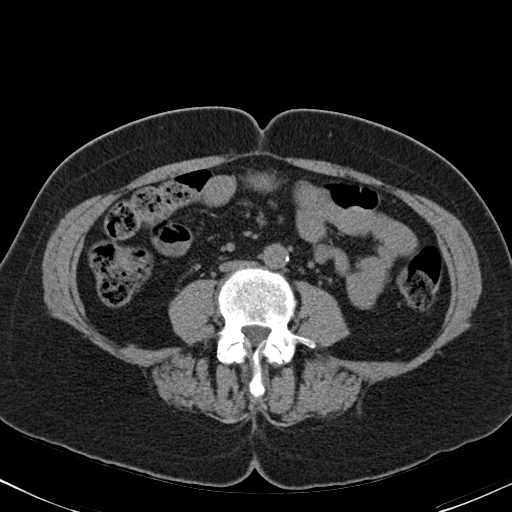
[im 55/98  soft-tissue]
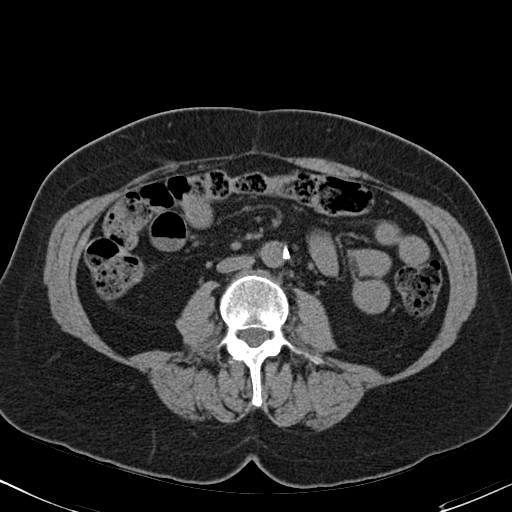
[im 64/98  soft-tissue]
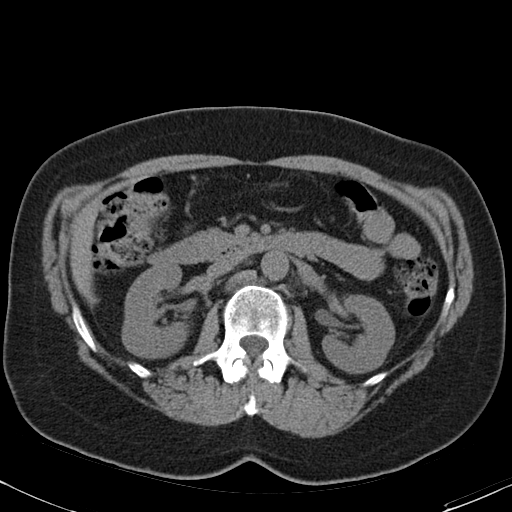
[im 64/98  bone]
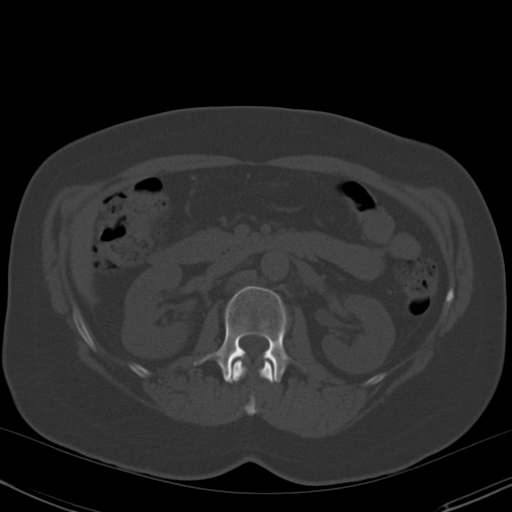
[im 72/98  soft-tissue]
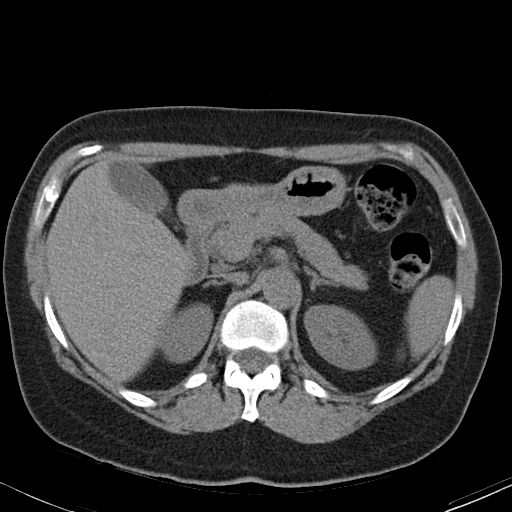
[im 76/98  soft-tissue]
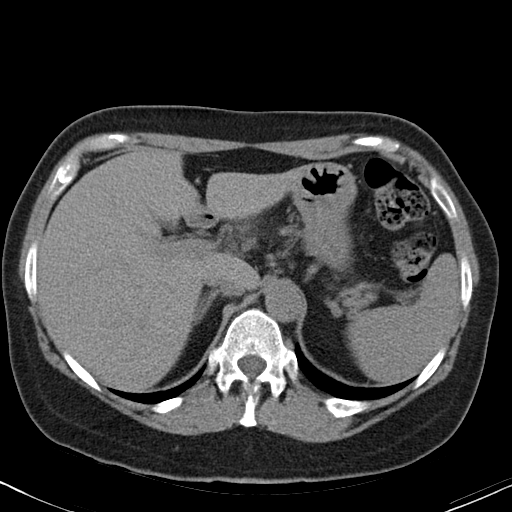
[im 85/98  soft-tissue]
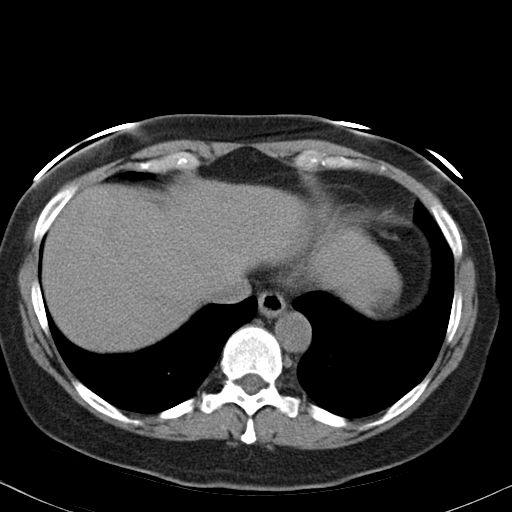
[im 93/98  soft-tissue]
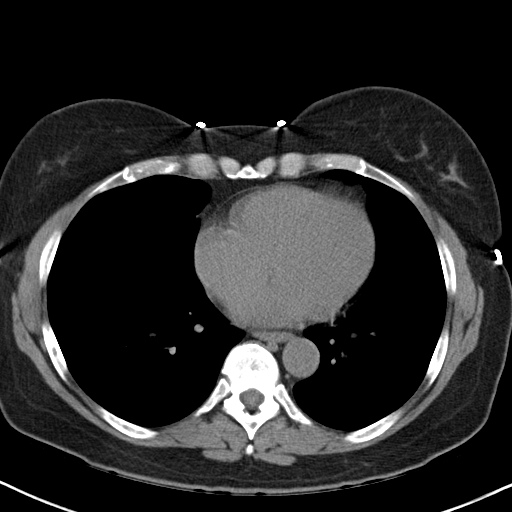

[Series 4: mpr coronal · coronal · 0.65mm/px · 3 of 76 slices shown]
[im 26/76  soft-tissue]
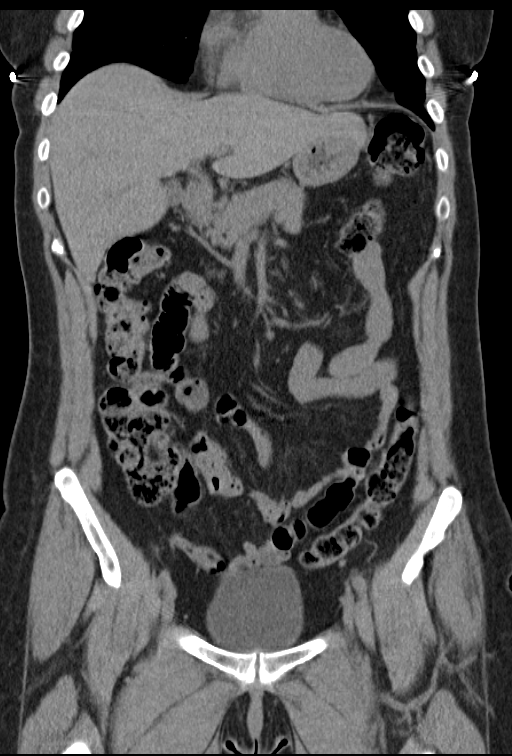
[im 34/76  soft-tissue]
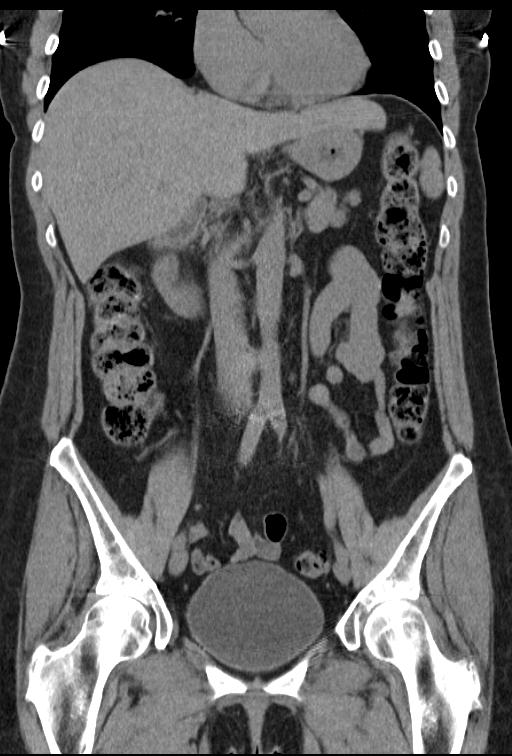
[im 42/76  soft-tissue]
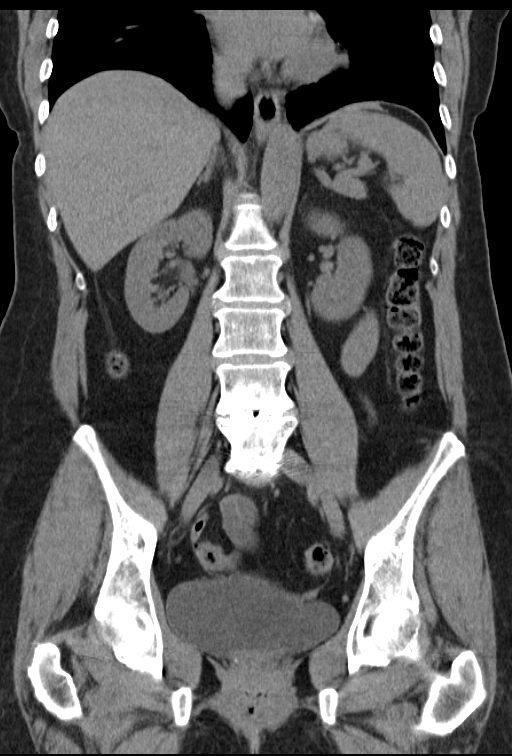

[16 of 46 positions shown; findings below may reference images not displayed]

FINDINGS: Negative lung bases.  No pericardial or pleural effusion.

Previous lower lumbar interbody fusion sequelae. Chronic appearing
T9 superior endplate deformity. Moderate to severe lumbar facet
degeneration on the right at L2-L3. No acute osseous abnormality
identified.

No pelvic free fluid.  Uterus surgically absent.  Negative rectum.

Mild to moderate diverticulosis of the sigmoid colon, no active
inflammation identified. Negative left colon, transverse colon,
right colon and appendix. No dilated small bowel.

Negative non contrast stomach, duodenum, liver, gallbladder, spleen,
pancreas, and adrenal glands.

No perinephric stranding. No nephrolithiasis. No hydronephrosis. No
right hydroureter or periureteral stranding. Negative course of the
right ureter. Unremarkable bladder. Tiny pelvic phleboliths. No
abdominal free fluid. Mild calcified atherosclerosis of the
abdominal aorta. No lymphadenopathy.
IMPRESSION: 1. No acute or inflammatory process identified in the abdomen or
pelvis. No urologic calculus. Normal appendix.
2. Degenerative and postoperative changes in the lumbar spine.

## 2016-12-12 ENCOUNTER — Encounter (HOSPITAL_COMMUNITY): Payer: Self-pay | Admitting: Adult Health

## 2016-12-12 ENCOUNTER — Encounter (HOSPITAL_COMMUNITY): Payer: Medicare Other | Attending: Hematology & Oncology | Admitting: Adult Health

## 2016-12-12 VITALS — BP 156/65 | HR 76 | Temp 98.2°F | Resp 18 | Wt 151.2 lb

## 2016-12-12 DIAGNOSIS — Z23 Encounter for immunization: Secondary | ICD-10-CM

## 2016-12-12 DIAGNOSIS — C50912 Malignant neoplasm of unspecified site of left female breast: Secondary | ICD-10-CM

## 2016-12-12 DIAGNOSIS — Z86000 Personal history of in-situ neoplasm of breast: Secondary | ICD-10-CM | POA: Diagnosis not present

## 2016-12-12 DIAGNOSIS — Z171 Estrogen receptor negative status [ER-]: Principal | ICD-10-CM

## 2016-12-12 DIAGNOSIS — M8580 Other specified disorders of bone density and structure, unspecified site: Secondary | ICD-10-CM | POA: Diagnosis not present

## 2016-12-12 MED ORDER — INFLUENZA VAC SPLIT QUAD 0.5 ML IM SUSY: 0.5000 mL | PREFILLED_SYRINGE | Freq: Once | INTRAMUSCULAR | Status: DC

## 2016-12-12 MED ORDER — INFLUENZA VAC SPLIT HIGH-DOSE 0.5 ML IM SUSY
0.5000 mL | PREFILLED_SYRINGE | Freq: Once | INTRAMUSCULAR | Status: AC
  Administered 2016-12-12: 0.5 mL via INTRAMUSCULAR

## 2016-12-12 MED ORDER — INFLUENZA VAC SPLIT HIGH-DOSE 0.5 ML IM SUSY
0.5000 mL | PREFILLED_SYRINGE | INTRAMUSCULAR | Status: AC
  Filled 2016-12-12: qty 0.5

## 2016-12-12 NOTE — Progress Notes (Signed)
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      10/05/2014 Initial Biopsy    Left breast core needle bx: high grade DCIS, ER- (0%), PR- (0%)      10/12/2014 Breast MRI    Left breast: nonmass enhancement in LOQ greater than that visualized on mammography; contiguous enhancement posterior and medial to the biopsied calcifications which does not have a mammographic correlate.      10/12/2014 Clinical Stage    Stage 0: Tis N0      10/16/2014 Procedure    Genetic testing: Breast/Ovarian panel ([facility]) revealed no clinically significant variant at ATM, BARD1, BRCA1, BRCA2, BRIP1, CDH1, CHEK2, EPCAM, FANCC, MLH1, MSH2, MSH6, NBN, PALB2, PMS2, PTEN, RAD51C, RAD51D, TP53, and XRCC2.      11/01/2014 Definitive Surgery    Bilateral mastectomy / SLNB with immediate reconstruction.  RIGHT: negative for malignancy.  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 1 LN removed and negative (0/1)      11/01/2014 Pathologic Stage    Stage 0: Tis N0      11/30/2014 Survivorship    Survivorship visit completed and copy of care plan provided to patient      03/13/2015 Procedure    1. Removal bilateral tissue expanders and placement silicone implants 2. Lipofilling to bilateral chest  SURGEON: Irene Limbo MD MBA         INTERVAL HISTORY:  Angie Jennings 66 y.o. female returns for routine follow-up for h/o left breast DCIS.    Overall, she tells me she has been feeling very well.  Appetite and energy levels both 100%.  She denies any pain.  Continues to take calcium/vitamin D supplementation.  Denies any breast concerns today.  Does share that she recently met with her plastic surgeon, Dr. Iran Planas, who told her she has (L) breast encapsulation.  As such, her breasts feel "very tight and hard. It feels like I always have a bra on even when I don't."    Her PCP is Dr. Willey Blade; she does not see him "unless I need him."  She sees Dr. Oneida Alar with GI periodically d/t history of GERD and family history of esophageal cancer.  She saw her OB/GYN in 04/2016 who sent her for ultrasound of her (R) breast/chest wall and she was found to have an oil cyst.  Remains on Effexor which is helpful for her hot flashes.   Shares with me that she was exercising and eating better for quite some time and lost some weight intentionally "and now I'm gaining some weight back so I have to get back to the gym!"    She is requesting her flu shot here today. She would like to have "high-dose" vaccine today.   Otherwise, she is largely without complaints today.    REVIEW OF SYSTEMS:  Review of Systems  Constitutional: Negative.  Negative for chills, fatigue and fever.  HENT:  Negative.  Negative for lump/mass and nosebleeds.   Eyes: Negative.   Respiratory: Negative.  Negative for cough and shortness of breath.   Cardiovascular: Negative.  Negative for chest pain and leg swelling.  Gastrointestinal: Negative.  Negative for abdominal pain, blood in stool, constipation, diarrhea, nausea and vomiting.  Endocrine: Negative.   Genitourinary: Negative.  Negative for dysuria and hematuria.   Musculoskeletal: Positive for arthralgias (occasional arthritis pain which has been chronic and unchanged).  Skin: Negative.  Negative for rash.  Neurological: Negative.  Negative for dizziness and headaches.  Hematological: Negative.  Negative for adenopathy.  Does not bruise/bleed easily.  Psychiatric/Behavioral: Negative.  Negative for depression and sleep disturbance. The patient is not nervous/anxious.      PAST MEDICAL/SURGICAL HISTORY:  Past Medical History:  Diagnosis Date  . Anxiety   . Arthritis    OA  . Breast cancer ([facility]) 09/2014   ER-/PR- DCIS  . Childhood asthma   . GERD (gastroesophageal reflux disease)   . Radiation    as child for treatment of keloids on face and neck   Past Surgical History:  Procedure Laterality Date  . ABDOMINAL HYSTERECTOMY  2006  . back surgery  1995   fusion of L4 & L5  . BACK SURGERY     lumbar fusion  . bravo capsule endoscopy  05/02/09   GERD with adequate acid suppression  . BRAVO [place] STUDY  11/21/2011   Procedure: BRAVO [place];  Surgeon: Danie Binder, MD;  [facility];  Service: Endoscopy;  Laterality: N/A;  . BREAST BIOPSY  10/05/14  . BREAST RECONSTRUCTION WITH PLACEMENT OF TISSUE EXPANDER AND FLEX HD (ACELLULAR HYDRATED DERMIS) Bilateral 11/01/2014   Procedure: BILATERAL BREAST RECONSTRUCTION WITH PLACEMENT OF TISSUE EXPANDER AND FLEX HD (ACELLULAR HYDRATED DERMIS);  Surgeon: Irene Limbo, MD;  Location: [facility];  Service: Plastics;  Laterality: Bilateral;  . BREAST SURGERY  AUGUST 2015   BREAST BIOPSY   . CATARACT EXTRACTION W/PHACO Right 07/23/2015   Procedure: CATARACT EXTRACTION PHACO AND INTRAOCULAR LENS PLACEMENT RIGHT EYE CDE=1.72;  Surgeon: Williams Che, MD;  [facility];  Service: Ophthalmology;  Laterality: Right;  . CATARACT EXTRACTION W/PHACO Left 11/05/2015   Procedure: CATARACT EXTRACTION PHACO AND INTRAOCULAR LENS PLACEMENT; CDE:  3.17;  Surgeon: Williams Che, MD;  [facility];  Service: Ophthalmology;  Laterality: Left;  . COLONOSCOPY  11/21/2011  . ESOPHAGOGASTRODUODENOSCOPY  04/20/09   mild gastritis  . ESOPHAGOGASTRODUODENOSCOPY  07/27/09   small internal hemorrhoids/rare sigmoid colon diverticula other wise no polyps  .  ESOPHAGOGASTRODUODENOSCOPY  11/21/2011   SLF: 1. the mucosa of the esophagus appeared normal 2. NOn-erosive gastritis (inflammation) was found multiple biopsies 3. the duodenal mucosa showed no abnormalitis.   [name] Kitchen LIPOSUCTION WITH LIPOFILLING Bilateral 03/13/2015   Procedure: LIPO FILLING TO BILATERAL BREAST FROM ABDOMEN;  Surgeon: Irene Limbo, MD;  Location: [facility];  Service: Plastics;  Laterality: Bilateral;  . NIPPLE SPARING MASTECTOMY/SENTINAL LYMPH NODE BIOPSY/RECONSTRUCTION/PLACEMENT OF TISSUE EXPANDER Bilateral 11/01/2014   Procedure: LEFT NIPPLE SPARING MASTECTOMY WITH LEFT  SENTINAL LYMPH NODE BIOPSY AND RIGHT PROPYLACTIC NIPPLE SPARING MASTECTOMY;  Surgeon: Rolm Bookbinder, MD;  Location: [facility];  Service: General;  Laterality: Bilateral;  . REMOVAL OF BILATERAL TISSUE EXPANDERS WITH PLACEMENT OF BILATERAL BREAST IMPLANTS Bilateral 03/13/2015   Procedure: REMOVAL OF BILATERAL TISSUE EXPANDERS WITH PLACEMENT OF BILATERAL BREAST IMPLANTS;  Surgeon: Irene Limbo, MD;  Location: [facility];  Service: Plastics;  Laterality: Bilateral;     SOCIAL HISTORY:  Social History   Social History  . Marital status: Married    Spouse name: N/A  . Number of children: 3  . Years of education: N/A   Occupational History  . Not on file.   Social History Main Topics  . Smoking status: Never Smoker  . Smokeless tobacco: Never Used  . Alcohol use 0.0 oz/week     Comment: social  . Drug use: No  . Sexual activity: Yes   Other Topics Concern  . Not on file   Social History Narrative  . No narrative on file    FAMILY HISTORY:  Family History  Problem Relation Age of Onset  . Dementia Mother   . Esophageal cancer Mother 41  . Hypertension Father   . Lung cancer Father 55       smoker  . Prostate cancer Brother 54  . Dementia Maternal Aunt   . Bladder Cancer Maternal Uncle 32       former smoker  . Heart attack Maternal  Grandmother   . Dementia Maternal Grandfather   . Esophageal cancer Maternal Aunt 72       non-smoker  . Brain cancer Maternal Uncle 63       Meningioma - benign  . Esophageal cancer Maternal Uncle 61    CURRENT MEDICATIONS:  Outpatient Encounter Prescriptions as of 12/12/2016  Medication Sig  . acetaminophen (TYLENOL) 500 MG tablet Take 500 mg by mouth every 6 (six) hours as needed. Reported on 05/09/2015  . Calcium Citrate-Vitamin D (CITRACAL PETITES/VITAMIN D PO) Take 2 tablets by mouth daily.  . NONFORMULARY OR COMPOUNDED ITEM Estradiol 0.02 % 90m prefilled applicator Sig: apply twice a week  . RABEprazole (ACIPHEX) 20 MG tablet Take 1 tablet (20 mg total) by mouth daily.  .[name] Kitchenvenlafaxine (EFFEXOR) 37.5 MG tablet Take 1 tablet (37.5 mg total) by mouth daily.   Facility-Administered Encounter Medications as of 12/12/2016  Medication  . Influenza vac split quadrivalent PF (FLUARIX) injection 0.5 mL  . [START ON 12/13/2016] Influenza vac split quadrivalent PF (FLUZONE HIGH-DOSE) injection 0.5 mL  . [COMPLETED] Influenza vac split quadrivalent PF (FLUZONE HIGH-DOSE) injection 0.5 mL    ALLERGIES:  Allergies  Allergen Reactions  . Aspirin Other (See Comments)    Pt. states ASA makes her bleed.   . Ioxaglate Hives  . Ivp Dye [Iodinated Diagnostic Agents] Hives  . Kapidex [Dexlansoprazole] Diarrhea  . Lidocaine Nausea Only    Feels hot and flushing  . Metronidazole   . Nsaids   . Clarithromycin Rash     PHYSICAL EXAM:  ECOG Performance status: 0 - Asymptomatic   Vitals:   12/12/16 1244  BP: (!) 156/65  Pulse: 76  Resp: 18  Temp: 98.2 F (36.8 C)  SpO2: 100%   Filed Weights   12/12/16 1244  Weight: 151 lb 3.2 oz (68.6 kg)    Physical Exam  Constitutional: She is oriented to person, place, and time and well-developed, well-nourished, and in no distress.  HENT:  Head: Normocephalic.  Mouth/Throat: Oropharynx is clear and moist. No oropharyngeal exudate.  Eyes:  Pupils are equal, round, and reactive to light. Conjunctivae are normal. No scleral icterus.  Neck: Normal range of motion. Neck supple.  Cardiovascular: Normal rate, regular rhythm and normal heart sounds.   Pulmonary/Chest: Effort normal and breath sounds normal. No respiratory distress.    Abdominal: Soft. Bowel sounds are normal. There is no tenderness.  Musculoskeletal: Normal range  of motion. She exhibits no edema.  Lymphadenopathy:    She has no cervical adenopathy.       Right: No supraclavicular adenopathy present.       Left: No supraclavicular adenopathy present.  Neurological: She is alert and oriented to person, place, and time. No cranial nerve deficit. Gait normal.  Skin: Skin is warm and dry. No rash noted.  Psychiatric: Mood, memory, affect and judgment normal.  Nursing note and vitals reviewed.    LABORATORY DATA:  I have reviewed the labs as listed.  CBC    Component Value Date/Time   WBC 7.4 10/30/2015 1130   RBC 5.00 10/30/2015 1130   HGB 15.1 (H) 10/30/2015 1130   HCT 45.1 10/30/2015 1130   PLT 208 10/30/2015 1130   MCV 90.2 10/30/2015 1130   MCH 30.2 10/30/2015 1130   MCHC 33.5 10/30/2015 1130   RDW 13.7 10/30/2015 1130   LYMPHSABS 2.3 10/27/2014 1145   MONOABS 0.8 10/27/2014 1145   EOSABS 0.2 10/27/2014 1145   BASOSABS 0.0 10/27/2014 1145   CMP Latest Ref Rng & Units 10/30/2015 07/17/2015 10/27/2014  Glucose 65 - 99 mg/dL 80 95 99  BUN 6 - 20 mg/dL '16 11 12  '$ Creatinine 0.44 - 1.00 mg/dL 0.92 0.71 0.81  Sodium 135 - 145 mmol/L 139 138 137  Potassium 3.5 - 5.1 mmol/L 4.9 4.6 4.2  Chloride 101 - 111 mmol/L 104 104 102  CO2 22 - 32 mmol/L '25 28 28  '$ Calcium 8.9 - 10.3 mg/dL 9.6 9.4 9.2  Total Protein 6.0 - 8.3 g/dL - - -  Total Bilirubin 0.3 - 1.2 mg/dL - - -  Alkaline Phos 39 - 117 U/L - - -  AST 0 - 37 U/L - - -  ALT 0 - 35 U/L - - -    PENDING LABS:    DIAGNOSTIC IMAGING:    PATHOLOGY:  Bilateral mastectomy surgical path:  11/01/14           ASSESSMENT & PLAN:   Stage 0 DCIS of (L) breast; ER-/PR-:  -Diagnosed in 09/2014. Underwent bilateral mastectomy by Dr. Donne Hazel.  Breast reconstruction done with Dr. Iran Planas.  Genetic testing negative for any clinically significant variant.  -No role for anti-estrogen therapy given tumor was ER-.  -No role for mammogram given bilateral mastectomies.  -Clinical breast exam performed today and benign; no clinical evidence of recurrent disease.  -Return to cancer center in 1 year for continued follow-up; no labs necessary.   Bone health:  -Last DEXA scan done on 04/29/16 revealed osteopenia. (per[facility]) -Since she did not require anti-estrogen therapy, will defer any future bone mineral density imaging to her PCP as they deem clinically indicated.  -Recommended calcium/vitamin D supplementation and increase weight-bearing exercises as tolerated.   Health maintenance/Wellness promotion:  -Reportedly up-to-date with her cancer screenings including colonoscopy and pap smears.  She has a PCP who she does not see regularly; encouraged her to reconnect with her PCP for health maintenance and management of other comorbid conditions as they deem clinically appropriate.  -Encouraged continued healthy diet and exercise.  -High-dose flu vaccine given to patient today by nursing.      Dispo:  -Return to cancer center in 1 year for continued follow-up; no labs necessary.     All questions were answered to patient's stated satisfaction. Encouraged patient to call with any new concerns or questions before her next visit to the cancer center and we can certain see her  sooner, if needed.    Plan of care discussed with Dr. Oliva Bustard, who agrees with the above aforementioned.    Orders placed this encounter:  No orders of the defined types were placed in this encounter.     Mike Craze, NP [facility] [phone]

## 2016-12-12 NOTE — Patient Instructions (Signed)
[place] at [facility] Discharge Instructions  RECOMMENDATIONS MADE BY THE CONSULTANT AND ANY TEST RESULTS WILL BE SENT TO YOUR REFERRING PHYSICIAN.  You saw Mike Craze, NP, today See Andee Poles at checkout for appointments.   Thank you for choosing [place] at [facility] to provide your oncology and hematology care.  To afford each patient quality time with our provider, please arrive at least 15 minutes before your scheduled appointment time.    If you have a lab appointment with the [facility] please come in thru the  Main Entrance and check in at the main information desk  You need to re-schedule your appointment should you arrive 10 or more minutes late.  We strive to give you quality time with our providers, and arriving late affects you and other patients whose appointments are after yours.  Also, if you no show three or more times for appointments you may be dismissed from the clinic at the providers discretion.     Again, thank you for choosing [facility].  Our hope is that these requests will decrease the amount of time that you wait before being seen by our physicians.       _____________________________________________________________  Should you have questions after your visit to [facility], please contact our office at [phone] between the hours of 8:30 a.m. and 4:30 p.m.  Voicemails left after 4:30 p.m. will not be returned until the following business day.  For prescription refill requests, have your pharmacy contact our office.       Resources For Cancer Patients and their Caregivers ? American Cancer Society: Can assist with transportation, wigs, general needs, runs Look Good Feel Better.        207-224-7678 ? Cancer Care: Provides financial assistance, online support groups, medication/co-pay assistance.  1-800-813-HOPE 708-717-3732) ? [place] Assists  [place] cancer patients and their families through emotional , educational and financial support.  (786)127-6936 ? [place] DSS Where to apply for food stamps, Medicaid and utility assistance. 586-256-7141 ? [facility]: Transportation to medical appointments. (623)543-0708 ? Social Security Administration: May apply for disability if have a Stage IV cancer. 623-886-1262 (574)587-1502 ? [employer], Disability and Transit Services: Assists with nutrition, care and transit needs. [place] Support Programs: @10RELATIVEDAYS @ > Cancer Support Group  2nd Tuesday of the month 1pm-2pm, Journey Room  > Creative Journey  3rd Tuesday of the month 1130am-1pm, Journey Room  > Look Good Feel Better  1st Wednesday of the month 10am-12 noon, Journey Room (Call [place] to register [phone])

## 2017-04-15 ENCOUNTER — Encounter: Payer: Self-pay | Admitting: Gastroenterology

## 2017-05-05 ENCOUNTER — Telehealth: Payer: Self-pay | Admitting: Adult Health

## 2017-05-05 MED ORDER — VENLAFAXINE HCL 37.5 MG PO TABS: 37.5000 mg | ORAL_TABLET | Freq: Every day | ORAL | 1 refills | Status: DC

## 2017-05-05 NOTE — Telephone Encounter (Signed)
Refill done.  

## 2017-05-07 ENCOUNTER — Encounter: Payer: Self-pay | Admitting: Gastroenterology

## 2017-05-07 ENCOUNTER — Other Ambulatory Visit: Payer: Self-pay

## 2017-05-07 ENCOUNTER — Ambulatory Visit: Payer: Medicare Other | Admitting: Gastroenterology

## 2017-05-07 DIAGNOSIS — R1013 Epigastric pain: Secondary | ICD-10-CM

## 2017-05-07 DIAGNOSIS — K219 Gastro-esophageal reflux disease without esophagitis: Secondary | ICD-10-CM

## 2017-05-07 MED ORDER — RABEPRAZOLE SODIUM 20 MG PO TBEC: 20.0000 mg | DELAYED_RELEASE_TABLET | Freq: Two times a day (BID) | ORAL | 11 refills | Status: DC

## 2017-05-07 NOTE — Assessment & Plan Note (Addendum)
SYMPTOMS NOT CONTROLLED AND LIKELY DUE TO UNCONTROLLED GERD. DIFFERENTIAL DIAGNOSIS INCLUDES: H PYLORI GASTRITIS, UNCONTROLLED GERD, LESS LIKELY GE JUNCTION TUMOR, GASTRIC  CA.   DRINK WATER TO KEEP YOUR URINE LIGHT YELLOW. AVOID REFLUX TRIGGERS.  HANDOUT GIVEN. FOLLOW A LOW FAT DIET.  HANDOUT GIVEN. INCREASE ACIPHEX TO BID. COMPLETE UPPER ENDOSCOPY IN 2-3 WEEKS. NOW ON EFFEXOR. PHENERGAN 12.5 MG IV IN PREOP.   FOLLOW UP IN 4 MOS.

## 2017-05-07 NOTE — Progress Notes (Signed)
CC'ED TO PCP 

## 2017-05-07 NOTE — Progress Notes (Signed)
Subjective:    Patient ID: Angie Jennings, female    DOB: Jul 13, 1950, 67 y.o.   MRN: [id number]  [name], MD  HPI LAST OPV 2017. MID-FEB 2019 WENT TO A WINE TASTING AND GERD FLARED. HAD BEEN NOTICING IN AM THROAT IS SORE, HOARSE, EARS SORE. THINKS ACIPHEX NOT WORKING. CAN'T TAKE IT ON AN EMPTY STOMACH. STARTED TRYING TO TAKE IT AT LUNCH AND NOT MAKING ANY DIFFERENCE. NOT INCREASED THE DOSE. SYMPTOMS:  HOARSE, RAW THROAT, FEELS LIKE A STAKE THROUGH THE MIDDLE OF THE CHEST, NAUSEA. HAVING SYMPTOMS EVERY DAY SINCE FEB 2019 AND THE FLARE WAS IT'S WORSE BUT NOW NOT BACK TO BASELINE. BETTER: FOOD. PUTS FOOD IN HER STOMACH BECAUSE IT MAKES HER STOMACH FEELS BETTER. CAN'T TAKE ASA BECAUSE IT MAKES HER BLEED. BMs: 2 USU, SOFT/LOOSE. EPISODIC LOOSE BUT NOT WATERY. FEELS SORE IN THE EPIGASTRIUM AND FEELS LIKE A PULLED MUSCLE IN R FLANK AND THINKS IT MAY BE DUE TO BACK PROBLEMS. DIDN'T LIKE BACLOFEN AND HESITANT TO RE-START. ON EFFEXOR SINCE BREAST SURGERY. ONCE A DAY ACIPHEX SINCE 2013??. NEVER SMOKED BECAUSE SHE HAD ASTHMA.  PT DENIES FEVER, CHILLS, HEMATOCHEZIA, HEMATEMESIS, vomiting, melena, diarrhea, SHORTNESS OF BREATH, CHANGE IN BOWEL IN HABITS, constipation, problems swallowing, OR problems with sedation.  Past Medical History:  Diagnosis Date  . Anxiety   . Arthritis    OA  . 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 Childhood asthma   . GERD (gastroesophageal reflux disease)   . Radiation    as child for treatment of keloids on face and neck   Past Surgical History:  Procedure Laterality Date  . ABDOMINAL HYSTERECTOMY  2006  . back surgery  1995   fusion of L4 & L5  . BACK SURGERY     lumbar fusion  . bravo capsule endoscopy  05/02/09   GERD with adequate acid suppression  . BRAVO [place] STUDY  11/21/2011   Procedure: BRAVO [place];  Surgeon: Danie Binder, MD;  [facility];  Service: Endoscopy;  Laterality: N/A;  . BREAST BIOPSY  10/05/14  . BREAST RECONSTRUCTION  WITH PLACEMENT OF TISSUE EXPANDER AND FLEX HD (ACELLULAR HYDRATED DERMIS) Bilateral 11/01/2014   Procedure: BILATERAL BREAST RECONSTRUCTION WITH PLACEMENT OF TISSUE EXPANDER AND FLEX HD (ACELLULAR HYDRATED DERMIS);  Surgeon: Irene Limbo, MD;  Location: [facility];  Service: Plastics;  Laterality: Bilateral;  . BREAST SURGERY  AUGUST 2015   BREAST BIOPSY   . CATARACT EXTRACTION W/PHACO Right 07/23/2015   Procedure: CATARACT EXTRACTION PHACO AND INTRAOCULAR LENS PLACEMENT RIGHT EYE CDE=1.72;  Surgeon: Williams Che, MD;  Location: [facility];  Service: Ophthalmology;  Laterality: Right;  . CATARACT EXTRACTION W/PHACO Left 11/05/2015   Procedure: CATARACT EXTRACTION PHACO AND INTRAOCULAR LENS PLACEMENT; CDE:  3.17;  Surgeon: Williams Che, MD;  Location: [facility];  Service: Ophthalmology;  Laterality: Left;  . COLONOSCOPY  11/21/2011  . ESOPHAGOGASTRODUODENOSCOPY  04/20/09   mild gastritis  . ESOPHAGOGASTRODUODENOSCOPY  07/27/09   small internal hemorrhoids/rare sigmoid colon diverticula other wise no polyps  . ESOPHAGOGASTRODUODENOSCOPY  11/21/2011   SLF: 1. the mucosa of the esophagus appeared normal 2. NOn-erosive gastritis (inflammation) was found multiple biopsies 3. the duodenal mucosa showed no abnormalitis.   [name] Kitchen LIPOSUCTION WITH LIPOFILLING Bilateral 03/13/2015   Procedure: LIPO FILLING TO BILATERAL BREAST FROM ABDOMEN;  Surgeon: Irene Limbo, MD;  Location: [facility];  Service: Plastics;  Laterality: Bilateral;  . NIPPLE SPARING MASTECTOMY/SENTINAL LYMPH  NODE BIOPSY/RECONSTRUCTION/PLACEMENT OF TISSUE EXPANDER Bilateral 11/01/2014   Procedure: LEFT NIPPLE SPARING MASTECTOMY WITH LEFT  SENTINAL LYMPH NODE BIOPSY AND RIGHT PROPYLACTIC NIPPLE SPARING MASTECTOMY;  Surgeon: Rolm Bookbinder, MD;  Location: [facility];  Service: General;  Laterality: Bilateral;  . REMOVAL OF BILATERAL TISSUE EXPANDERS WITH PLACEMENT OF BILATERAL BREAST IMPLANTS Bilateral  03/13/2015   Procedure: REMOVAL OF BILATERAL TISSUE EXPANDERS WITH PLACEMENT OF BILATERAL BREAST IMPLANTS;  Surgeon: Irene Limbo, MD;  Location: [facility];  Service: Plastics;  Laterality: Bilateral;    Allergies  Allergen Reactions  . Aspirin Other (See Comments)    Pt. states ASA makes her bleed.   . Ioxaglate Hives  . Ivp Dye [Iodinated Diagnostic Agents] Hives  . Kapidex [Dexlansoprazole] Diarrhea  . Lidocaine Nausea Only    Feels hot and flushing  . Metronidazole   . Nsaids   . Clarithromycin Rash   Current Outpatient Medications  Medication Sig    . acetaminophen (TYLENOL) 500 MG tablet Take 500 mg by mouth every 6 (six) hours as needed. Reported on 05/09/2015    . Calcium Citrate-Vitamin D (CITRACAL PETITES/VITAMIN D PO) Take 2 tablets by mouth daily.    . NONFORMULARY OR COMPOUNDED ITEM Estradiol 0.02 % 76ml prefilled applicator Sig: apply twice a week    . RABEprazole (ACIPHEX) 20 MG tablet Take 1 tablet (20 mg total) by mouth daily.    [name] Kitchen venlafaxine (EFFEXOR) 37.5 MG tablet Take 1 tablet (37.5 mg total) by mouth daily.     Review of Systems PER HPI OTHERWISE ALL SYSTEMS ARE NEGATIVE.    Objective:   Physical Exam  Constitutional: She is oriented to person, place, and time. She appears well-developed and well-nourished. No distress.  HENT:  Head: Normocephalic and atraumatic.  Mouth/Throat: Oropharynx is clear and moist. No oropharyngeal exudate.  Eyes: Pupils are equal, round, and reactive to light. No scleral icterus.  Neck: Normal range of motion. Neck supple.  Cardiovascular: Normal rate, regular rhythm and normal heart sounds.  Pulmonary/Chest: Effort normal and breath sounds normal. No respiratory distress.  Abdominal: Soft. Bowel sounds are normal. She exhibits no distension. There is tenderness. There is no rebound and no guarding.  MILD RUQ TTP  Musculoskeletal: She exhibits no edema.  Lymphadenopathy:    She has no cervical adenopathy.    Neurological: She is alert and oriented to person, place, and time.  NO  NEW FOCAL DEFICITS  Psychiatric:  SLIGHTLY ANXIOUS MOOD, NL AFFECT  Vitals reviewed.         Assessment & Plan:

## 2017-05-07 NOTE — Assessment & Plan Note (Signed)
SYMPTOMS NOT IDEALLY CONTROLLED ON ACIPHEX QD.  REVIEWED RECORDS FROM 2013 TO PRESENT. INCREASE ACIPHEX TO BID. AVOID TRIGGERS LOW FTA DIET FOLLOW UP IN 4 MOS.

## 2017-05-07 NOTE — Patient Instructions (Addendum)
DRINK WATER TO KEEP YOUR URINE LIGHT YELLOW.  AVOID REFLUX TRIGGERS. SEE INFO BELOW.  FOLLOW A LOW FAT DIET. SEE INFO BELOW.  INCREASE ACIPHEX TO TWICE DAILY WITH BREAKFAST AND SUPPER.  COMPLETE UPPER ENDOSCOPY IN 2 TO 3 WEEKS.   FOLLOW UP IN 4 MOS.     Lifestyle and home remedies TO CONTROL HEARTBURN/REFLUX You may eliminate or reduce the frequency of heartburn by making the following lifestyle changes:  . Control your weight. Being overweight is a major risk factor for heartburn and GERD. Excess pounds put pressure on your abdomen, pushing up your stomach and causing acid to back up into your esophagus.   . Eat smaller meals. 4 TO 6 MEALS A DAY. This reduces pressure on the lower esophageal sphincter, helping to prevent the valve from opening and acid from washing back into your esophagus.   [name] your belt. Clothes that fit tightly around your waist put pressure on your abdomen and the lower esophageal sphincter.  .  . Eliminate heartburn triggers. Everyone has specific triggers. Common triggers such as fatty or fried foods, spicy food, tomato sauce, carbonated beverages, alcohol, chocolate, mint, garlic, onion, caffeine and nicotine may make heartburn worse.   [name] Kitchen Avoid stooping or bending. Tying your shoes is OK. Bending over for longer periods to weed your garden isn't, especially soon after eating.   . Don't lie down after a meal. Wait at least three to four hours after eating before going to bed, and don't lie down right after eating.   Alternative medicine . Several home remedies exist for treating GERD, but they provide only temporary relief. They include drinking baking soda (sodium bicarbonate) added to water or drinking other fluids such as baking soda mixed with cream of tartar and water. . Although these liquids create temporary relief by neutralizing, washing away or buffering acids, eventually they aggravate the situation by adding gas and fluid to your stomach,  increasing pressure and causing more acid reflux. Further, adding more sodium to your diet may increase your blood pressure and add stress to your heart, and excessive bicarbonate ingestion can alter the acid-base balance in your body.   Low-Fat Diet BREADS, CEREALS, PASTA, RICE, DRIED PEAS, AND BEANS These products are high in carbohydrates and most are low in fat. Therefore, they can be increased in the diet as substitutes for fatty foods. They too, however, contain calories and should not be eaten in excess. Cereals can be eaten for snacks as well as for breakfast.  Include foods that contain fiber (fruits, vegetables, whole grains, and legumes). Research shows that fiber may lower blood cholesterol levels, especially the water-soluble fiber found in fruits, vegetables, oat products, and legumes. FRUITS AND VEGETABLES It is good to eat fruits and vegetables. Besides being sources of fiber, both are rich in vitamins and some minerals. They help you get the daily allowances of these nutrients. Fruits and vegetables can be used for snacks and desserts. MEATS Limit lean meat, chicken, [place], and fish to no more than 6 ounces per day. Beef, Pork, and Lamb Use lean cuts of beef, pork, and lamb. Lean cuts include:  Extra-lean ground beef.  Arm roast.  Sirloin tip.  Center-cut ham.  Round steak.  Loin chops.  Rump roast.  Tenderloin.  Trim all fat off the outside of meats before cooking. It is not necessary to severely decrease the intake of red meat, but lean choices should be made. Lean meat is rich in protein and contains a  highly absorbable form of iron. Premenopausal women, in particular, should avoid reducing lean red meat because this could increase the risk for low red blood cells (iron-deficiency anemia).  Chicken and [place] These are good sources of protein. The fat of poultry can be reduced by removing the skin and underlying fat layers before cooking. Chicken and [place] can be  substituted for lean red meat in the diet. Poultry should not be fried or covered with high-fat sauces. Fish and Shellfish Fish is a good source of protein. Shellfish contain cholesterol, but they usually are low in saturated fatty acids. The preparation of fish is important. Like chicken and [place], they should not be fried or covered with high-fat sauces. EGGS Egg whites contain no fat or cholesterol. They can be eaten often. Try 1 to 2 egg whites instead of whole eggs in recipes or use egg substitutes that do not contain yolk.  MILK AND DAIRY PRODUCTS Use skim or 1% milk instead of 2% or whole milk. Decrease whole milk, natural, and processed cheeses. Use nonfat or low-fat (2%) cottage cheese or low-fat cheeses made from vegetable oils. Choose nonfat or low-fat (1 to 2%) yogurt. Experiment with evaporated skim milk in recipes that call for heavy cream. Substitute low-fat yogurt or low-fat cottage cheese for sour cream in dips and salad dressings. Have at least 2 servings of low-fat dairy products, such as 2 glasses of skim (or 1%) milk each day to help get your daily calcium intake.  FATS AND OILS Butterfat, lard, and beef fats are high in saturated fat and cholesterol. These should be avoided.Vegetable fats do not contain cholesterol. AVOID coconut oil, palm oil, and palm kernel oil, WHICH are very high in saturated fats. These should be limited. These fats are often used in bakery goods, processed foods, popcorn, oils, and nondairy creamers. Vegetable shortenings and some peanut butters contain hydrogenated oils, which are also saturated fats. Read the labels on these foods and check for saturated vegetable oils.  Desirable liquid vegetable oils are corn oil, cottonseed oil, olive oil, canola oil, safflower oil, soybean oil, and sunflower oil. Peanut oil is not as good, but small amounts are acceptable. Buy a heart-healthy tub margarine that has no partially hydrogenated oils in the ingredients.  AVOID Mayonnaise and salad dressings often are made from unsaturated fats.  OTHER EATING TIPS Snacks  Most sweets should be limited as snacks. They tend to be rich in calories and fats, and their caloric content outweighs their nutritional value. Some good choices in snacks are graham crackers, melba toast, soda crackers, bagels (no egg), English muffins, fruits, and vegetables. These snacks are preferable to snack crackers, [place] fries, and chips. Popcorn should be air-popped or cooked in small amounts of liquid vegetable oil.  Desserts Eat fruit, low-fat yogurt, and fruit ices instead of pastries, cake, and cookies. Sherbet, angel food cake, gelatin dessert, frozen low-fat yogurt, or other frozen products that do not contain saturated fat (pure fruit juice bars, frozen ice pops) are also acceptable.   COOKING METHODS Choose those methods that use little or no fat. They include: Poaching.  Braising.  Steaming.  Grilling.  Baking.  Stir-frying.  Broiling.  Microwaving.  Foods can be cooked in a nonstick pan without added fat, or use a nonfat cooking spray in regular cookware. Limit fried foods and avoid frying in saturated fat. Add moisture to lean meats by using water, broth, cooking wines, and other nonfat or low-fat sauces along with the cooking methods mentioned above.  Soups and stews should be chilled after cooking. The fat that forms on top after a few hours in the refrigerator should be skimmed off. When preparing meals, avoid using excess salt. Salt can contribute to raising blood pressure in some people.  EATING AWAY FROM HOME Order entres, potatoes, and vegetables without sauces or butter. When meat exceeds the size of a deck of cards (3 to 4 ounces), the rest can be taken home for another meal. Choose vegetable or fruit salads and ask for low-calorie salad dressings to be served on the side. Use dressings sparingly. Limit high-fat toppings, such as bacon, crumbled eggs, cheese,  sunflower seeds, and olives. Ask for heart-healthy tub margarine instead of butter.

## 2017-05-07 NOTE — Progress Notes (Signed)
ON RECALL  °

## 2017-05-22 ENCOUNTER — Other Ambulatory Visit: Payer: BC Managed Care – PPO | Admitting: Adult Health

## 2017-05-28 ENCOUNTER — Ambulatory Visit: Payer: Medicare Other | Admitting: Adult Health

## 2017-05-28 ENCOUNTER — Encounter: Payer: Self-pay | Admitting: Adult Health

## 2017-05-28 VITALS — BP 142/60 | HR 76 | Ht 66.5 in | Wt 153.0 lb

## 2017-05-28 DIAGNOSIS — R252 Cramp and spasm: Secondary | ICD-10-CM | POA: Diagnosis not present

## 2017-05-28 DIAGNOSIS — Z13220 Encounter for screening for lipoid disorders: Secondary | ICD-10-CM | POA: Diagnosis not present

## 2017-05-28 DIAGNOSIS — N3941 Urge incontinence: Secondary | ICD-10-CM

## 2017-05-28 DIAGNOSIS — Z1212 Encounter for screening for malignant neoplasm of rectum: Secondary | ICD-10-CM

## 2017-05-28 DIAGNOSIS — Z1329 Encounter for screening for other suspected endocrine disorder: Secondary | ICD-10-CM | POA: Diagnosis not present

## 2017-05-28 DIAGNOSIS — K219 Gastro-esophageal reflux disease without esophagitis: Secondary | ICD-10-CM

## 2017-05-28 DIAGNOSIS — Z01419 Encounter for gynecological examination (general) (routine) without abnormal findings: Secondary | ICD-10-CM | POA: Diagnosis not present

## 2017-05-28 DIAGNOSIS — Z1211 Encounter for screening for malignant neoplasm of colon: Secondary | ICD-10-CM | POA: Diagnosis not present

## 2017-05-28 DIAGNOSIS — Z131 Encounter for screening for diabetes mellitus: Secondary | ICD-10-CM | POA: Insufficient documentation

## 2017-05-28 DIAGNOSIS — Z1321 Encounter for screening for nutritional disorder: Secondary | ICD-10-CM | POA: Diagnosis not present

## 2017-05-28 LAB — HEMOCCULT GUIAC POC 1CARD (OFFICE): FECAL OCCULT BLD: NEGATIVE

## 2017-05-28 MED ORDER — VENLAFAXINE HCL 37.5 MG PO TABS: 37.5000 mg | ORAL_TABLET | Freq: Every day | ORAL | 3 refills | Status: DC

## 2017-05-28 NOTE — Progress Notes (Signed)
Patient ID: Angie Jennings, female   DOB: 04-Aug-1950, 67 y.o.   MRN: [id number] History of Present Illness: Angie Jennings is a 67 year old white female, married, sp hysterectomy in for a well woman gyn exam. PCP is Dr Willey Blade.   Current Medications, Allergies, Past Medical History, Past Surgical History, Family History and Social History were reviewed in [employer] record.     Review of Systems: Patient denies any headaches, hearing loss, fatigue, blurred vision, shortness of breath, chest pain, abdominal pain, problems with bowel movements, or intercourse. No joint pain or mood swings(on effexor). Has some urge incontinence at times, usually  with frequency,has estrogen cream that she uses at times. +leg cramps  +reflux,getting EGD in May    Physical Exam:BP (!) 142/60 (BP Location: Right Arm, Patient Position: Sitting, Cuff Size: Normal)   Pulse 76   Ht 5' 6.5" (1.689 m)   Wt 153 lb (69.4 kg)   BMI 24.32 kg/m  General:  Well developed, well nourished, no acute distress Skin:  Warm and dry Neck:  Midline trachea, normal thyroid, good ROM, no lymphadenopathy,no carotid bruits heard Lungs; Clear to auscultation bilaterally Breast:absent,has bilateral implants, left is a little harder than right Cardiovascular: Regular rate and rhythm Abdomen:  Soft, non tender, no hepatosplenomegaly Pelvic:  External genitalia is normal in appearance, no lesions.  The vagina is pale with loss of moisture and rugae. Urethra has no lesions or masses. The cervix and uterus are absent. No adnexal masses or tenderness noted.Bladder is non tender, no masses felt. Rectal: Good sphincter tone, no polyps, or hemorrhoids felt.  Hemoccult negative. Extremities/musculoskeletal:  No swelling or varicosities noted, no clubbing or cyanosis Psych:  No mood changes, alert and cooperative,seems happy PHQ 2 score 0.  Impression: 1. Well woman exam with routine gynecological exam   2. Screening for  colorectal cancer   3. Screening cholesterol level   4. Screening for thyroid disorder   5. Screening for diabetes mellitus   6. Cramps, extremity   7. Encounter for vitamin deficiency screening       Plan: Check CBC,CMP,TSH and lipids,A1c and vitamin D and magnesium  Get pneumonia shot DEXA 2020 in F/U Colonoscopy per GI Meds ordered this encounter  Medications  . venlafaxine (EFFEXOR) 37.5 MG tablet    Sig: Take 1 tablet (37.5 mg total) by mouth daily.    Dispense:  90 tablet    Refill:  3    Order Specific Question:   Supervising Provider    Answer:   Tania Ade H [[id number]]  She has had shingles shot, and had shingles 3 x too. Use vaginal estrogen cream 1-2 x weekly

## 2017-06-22 ENCOUNTER — Other Ambulatory Visit: Payer: Self-pay

## 2017-06-22 ENCOUNTER — Encounter (HOSPITAL_COMMUNITY)
Admission: RE | Disposition: A | Discharge status: Home / Self Care | Payer: Self-pay | Source: Ambulatory Visit | Attending: Gastroenterology

## 2017-06-22 ENCOUNTER — Ambulatory Visit (HOSPITAL_COMMUNITY)
Admission: RE | Admit: 2017-06-22 | Discharge: 2017-06-22 | Disposition: A | Discharge status: Home / Self Care | Payer: Medicare Other | Source: Ambulatory Visit | Attending: Gastroenterology | Admitting: Gastroenterology

## 2017-06-22 ENCOUNTER — Encounter (HOSPITAL_COMMUNITY): Payer: Self-pay

## 2017-06-22 DIAGNOSIS — K317 Polyp of stomach and duodenum: Secondary | ICD-10-CM

## 2017-06-22 DIAGNOSIS — K2970 Gastritis, unspecified, without bleeding: Secondary | ICD-10-CM | POA: Diagnosis not present

## 2017-06-22 DIAGNOSIS — M1990 Unspecified osteoarthritis, unspecified site: Secondary | ICD-10-CM | POA: Insufficient documentation

## 2017-06-22 DIAGNOSIS — F419 Anxiety disorder, unspecified: Secondary | ICD-10-CM | POA: Diagnosis not present

## 2017-06-22 DIAGNOSIS — J45909 Unspecified asthma, uncomplicated: Secondary | ICD-10-CM | POA: Insufficient documentation

## 2017-06-22 DIAGNOSIS — K2960 Other gastritis without bleeding: Secondary | ICD-10-CM | POA: Insufficient documentation

## 2017-06-22 DIAGNOSIS — Z800 Family history of malignant neoplasm of digestive organs: Secondary | ICD-10-CM | POA: Insufficient documentation

## 2017-06-22 DIAGNOSIS — K219 Gastro-esophageal reflux disease without esophagitis: Secondary | ICD-10-CM | POA: Diagnosis not present

## 2017-06-22 DIAGNOSIS — Z853 Personal history of malignant neoplasm of breast: Secondary | ICD-10-CM | POA: Insufficient documentation

## 2017-06-22 DIAGNOSIS — Z8249 Family history of ischemic heart disease and other diseases of the circulatory system: Secondary | ICD-10-CM | POA: Diagnosis not present

## 2017-06-22 DIAGNOSIS — Z9013 Acquired absence of bilateral breasts and nipples: Secondary | ICD-10-CM | POA: Insufficient documentation

## 2017-06-22 DIAGNOSIS — R1013 Epigastric pain: Secondary | ICD-10-CM

## 2017-06-22 HISTORY — PX: ESOPHAGOGASTRODUODENOSCOPY: SHX5428

## 2017-06-22 SURGERY — EGD (ESOPHAGOGASTRODUODENOSCOPY)
Anesthesia: Moderate Sedation

## 2017-06-22 MED ORDER — BACLOFEN 10 MG PO TABS: ORAL_TABLET | ORAL | 11 refills | Status: DC

## 2017-06-22 MED ORDER — STERILE WATER FOR IRRIGATION IR SOLN
Status: DC | PRN
  Administered 2017-06-22: 100 mL

## 2017-06-22 MED ORDER — PROMETHAZINE HCL 25 MG/ML IJ SOLN
INTRAMUSCULAR | Status: AC
  Administered 2017-06-22: 12.5 mg
  Filled 2017-06-22: qty 1

## 2017-06-22 MED ORDER — LIDOCAINE VISCOUS 2 % MT SOLN
OROMUCOSAL | Status: AC
  Filled 2017-06-22: qty 15

## 2017-06-22 MED ORDER — MEPERIDINE HCL 100 MG/ML IJ SOLN
INTRAMUSCULAR | Status: AC
  Filled 2017-06-22: qty 2

## 2017-06-22 MED ORDER — MEPERIDINE HCL 100 MG/ML IJ SOLN
INTRAMUSCULAR | Status: DC | PRN
  Administered 2017-06-22 (×2): 25 mg via INTRAVENOUS

## 2017-06-22 MED ORDER — PROMETHAZINE HCL 25 MG/ML IJ SOLN: 12.5000 mg | Freq: Four times a day (QID) | INTRAMUSCULAR | Status: DC | PRN

## 2017-06-22 MED ORDER — MIDAZOLAM HCL 5 MG/5ML IJ SOLN
INTRAMUSCULAR | Status: DC | PRN
  Administered 2017-06-22 (×2): 2 mg via INTRAVENOUS

## 2017-06-22 MED ORDER — SODIUM CHLORIDE 0.9% FLUSH
INTRAVENOUS | Status: AC
  Filled 2017-06-22: qty 10

## 2017-06-22 MED ORDER — SODIUM CHLORIDE 0.9 % IV SOLN
INTRAVENOUS | Status: DC
  Administered 2017-06-22: 13:00:00 via INTRAVENOUS

## 2017-06-22 MED ORDER — LIDOCAINE VISCOUS 2 % MT SOLN
OROMUCOSAL | Status: DC | PRN
  Administered 2017-06-22: 2 mL via OROMUCOSAL

## 2017-06-22 MED ORDER — MIDAZOLAM HCL 5 MG/5ML IJ SOLN
INTRAMUSCULAR | Status: AC
  Filled 2017-06-22: qty 10

## 2017-06-22 NOTE — Discharge Instructions (Signed)
You have gastritis, and benign stomach POLYPS. I biopsied your stomach & DUODENUM.   DRINK WATER TO KEEP YOUR URINE LIGHT YELLOW.  AVOID REFLUX TRIGGERS. SEE INFO BELOW.  FOLLOW A LOW FAT/HIGH FIBER DIET. SEE INFO BELOW.   CONTINUE ACIPHEX TWICE DAILY.  ADD BACLOFEN 30 MINS PRIOR TO BREAKFAST, LUNCH, & DINNER. BACLOFEN MAY CAUSE FATIGUE, NAUSEA, DIZZINESS, OR CONSTIPATION.   YOUR BIOPSY WILL BE BACK IN 7 DAYS.   FOLLOW UP IN AUG 2019 WITH DR. FIELDS.  (Dr Oneida Alar' office will call with appointment date and time)   UPPER ENDOSCOPY AFTER CARE Read the instructions outlined below and refer to this sheet in the next week. These discharge instructions provide you with general information on caring for yourself after you leave the hospital. While your treatment has been planned according to the most current medical practices available, unavoidable complications occasionally occur. If you have any problems or questions after discharge, call DR. FIELDS, [phone].  ACTIVITY  You may resume your regular activity, but move at a slower pace for the next 24 hours.   Take frequent rest periods for the next 24 hours.   Walking will help get rid of the air and reduce the bloated feeling in your belly (abdomen).   No driving for 24 hours (because of the medicine (anesthesia) used during the test).   You may shower.   Do not sign any important legal documents or operate any machinery for 24 hours (because of the anesthesia used during the test).    NUTRITION  Drink plenty of fluids.   You may resume your normal diet as instructed by your doctor.   Begin with a light meal and progress to your normal diet. Heavy or fried foods are harder to digest and may make you feel sick to your stomach (nauseated).   Avoid alcoholic beverages for 24 hours or as instructed.    MEDICATIONS  You may resume your normal medications.   WHAT YOU CAN EXPECT TODAY  Some feelings of bloating in the  abdomen.   Passage of more gas than usual.    IF YOU HAD A BIOPSY TAKEN DURING THE UPPER ENDOSCOPY:  Eat a soft diet IF YOU HAVE NAUSEA, BLOATING, ABDOMINAL PAIN, OR VOMITING.    FINDING OUT THE RESULTS OF YOUR TEST Not all test results are available during your visit. DR. Oneida Alar WILL CALL YOU WITHIN 14 DAYS OF YOUR PROCEDUE WITH YOUR RESULTS. Do not assume everything is normal if you have not heard from DR. FIELDS IN 14 DAYS, CALL HER OFFICE AT [phone].  SEEK IMMEDIATE MEDICAL ATTENTION AND CALL THE OFFICE: [phone] IF:  You have more than a spotting of blood in your stool.   Your belly is swollen (abdominal distention).   You are nauseated or vomiting.   You have a temperature over 101F.   You have abdominal pain or discomfort that is severe or gets worse throughout the day.   Gastritis  Gastritis is an inflammation (the body's way of reacting to injury and/or infection) of the stomach. It is often caused by bacterial (germ) infections. It can also be caused BY ASPIRIN, BC/GOODY POWDER'S, (IBUPROFEN) MOTRIN, OR ALEVE (NAPROXEN), chemicals (including alcohol), SPICY FOODS, and medications. This illness may be associated with generalized malaise (feeling tired, not well), UPPER ABDOMINAL STOMACH cramps, and fever. One common bacterial cause of gastritis is an organism known as H. Pylori. This can be treated with antibiotics.     REFLUX   TREATMENT There are a  number of medicines used to treat reflux including: Antacids.  ZANTAC Proton-pump inhibitors: ACIPHEX PEPCID OR ZANTAC   Lifestyle and home remedies TO CONTROL HEARTBURN You may eliminate or reduce the frequency of heartburn by making the following lifestyle changes:   Control your weight. Being overweight is a major risk factor for heartburn and GERD. Excess pounds put pressure on your abdomen, pushing up your stomach and causing acid to back up into your esophagus.    Eat smaller meals. 4 TO 6 MEALS A  DAY. This reduces pressure on the lower esophageal sphincter, helping to prevent the valve from opening and acid from washing back into your esophagus.    Loosen your belt. Clothes that fit tightly around your waist put pressure on your abdomen and the lower esophageal sphincter.    Eliminate heartburn triggers. Everyone has specific triggers. Common triggers such as fatty or fried foods, spicy food, tomato sauce, carbonated beverages, alcohol, chocolate, mint, garlic, onion, caffeine and nicotine may make heartburn worse.    Avoid stooping or bending. Tying your shoes is OK. Bending over for longer periods to weed your garden isn't, especially soon after eating.    Don't lie down after a meal. Wait at least three to four hours after eating before going to bed, and don't lie down right after eating.   Alternative medicine  Several home remedies exist for treating GERD, but they provide only temporary relief. They include drinking baking soda (sodium bicarbonate) added to water or drinking other fluids such as baking soda mixed with cream of tartar and water.  Although these liquids create temporary relief by neutralizing, washing away or buffering acids, eventually they aggravate the situation by adding gas and fluid to your stomach, increasing pressure and causing more acid reflux. Further, adding more sodium to your diet may increase your blood pressure and add stress to your heart, and excessive bicarbonate ingestion can alter the acid-base balance in your body.

## 2017-06-22 NOTE — H&P (Signed)
Primary Care Physician:  [name], MD Primary Gastroenterologist:  Dr. Oneida Alar  Pre-Procedure History & Physical: HPI:  Angie Jennings is a 67 y.o. female here for DYSPEPSIA.  Past Medical History:  Diagnosis Date  . Anxiety   . Arthritis    OA  . 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 Breast disorder   . Childhood asthma   . GERD (gastroesophageal reflux disease)   . Radiation    as child for treatment of keloids on face and neck    Past Surgical History:  Procedure Laterality Date  . ABDOMINAL HYSTERECTOMY  2006  . back surgery  1995   fusion of L4 & L5  . bravo capsule endoscopy  05/02/09   GERD with adequate acid suppression  . BRAVO [place] STUDY  11/21/2011   Procedure: BRAVO [place];  Surgeon: Danie Binder, MD;  [facility];  Service: Endoscopy;  Laterality: N/A;  . BREAST BIOPSY  10/05/14  . BREAST RECONSTRUCTION WITH PLACEMENT OF TISSUE EXPANDER AND FLEX HD (ACELLULAR HYDRATED DERMIS) Bilateral 11/01/2014   Procedure: BILATERAL BREAST RECONSTRUCTION WITH PLACEMENT OF TISSUE EXPANDER AND FLEX HD (ACELLULAR HYDRATED DERMIS);  Surgeon: Irene Limbo, MD;  Location: [facility];  Service: Plastics;  Laterality: Bilateral;  . BREAST SURGERY  AUGUST 2015   BREAST BIOPSY   . CATARACT EXTRACTION W/PHACO Right 07/23/2015   Procedure: CATARACT EXTRACTION PHACO AND INTRAOCULAR LENS PLACEMENT RIGHT EYE CDE=1.72;  Surgeon: Williams Che, MD;  Location: [facility];  Service: Ophthalmology;  Laterality: Right;  . CATARACT EXTRACTION W/PHACO Left 11/05/2015   Procedure: CATARACT EXTRACTION PHACO AND INTRAOCULAR LENS PLACEMENT; CDE:  3.17;  Surgeon: Williams Che, MD;  Location: [facility];  Service: Ophthalmology;  Laterality: Left;  . COLONOSCOPY  11/21/2011  . ESOPHAGOGASTRODUODENOSCOPY  04/20/09   mild gastritis  . ESOPHAGOGASTRODUODENOSCOPY  07/27/09   small internal hemorrhoids/rare sigmoid colon diverticula other wise no polyps  .  ESOPHAGOGASTRODUODENOSCOPY  11/21/2011   SLF: 1. the mucosa of the esophagus appeared normal 2. NOn-erosive gastritis (inflammation) was found multiple biopsies 3. the duodenal mucosa showed no abnormalitis.   [name] Kitchen LIPOSUCTION WITH LIPOFILLING Bilateral 03/13/2015   Procedure: LIPO FILLING TO BILATERAL BREAST FROM ABDOMEN;  Surgeon: Irene Limbo, MD;  Location: [facility];  Service: Plastics;  Laterality: Bilateral;  . NIPPLE SPARING MASTECTOMY/SENTINAL LYMPH NODE BIOPSY/RECONSTRUCTION/PLACEMENT OF TISSUE EXPANDER Bilateral 11/01/2014   Procedure: LEFT NIPPLE SPARING MASTECTOMY WITH LEFT  SENTINAL LYMPH NODE BIOPSY AND RIGHT PROPYLACTIC NIPPLE SPARING MASTECTOMY;  Surgeon: Rolm Bookbinder, MD;  Location: [facility];  Service: General;  Laterality: Bilateral;  . REMOVAL OF BILATERAL TISSUE EXPANDERS WITH PLACEMENT OF BILATERAL BREAST IMPLANTS Bilateral 03/13/2015   Procedure: REMOVAL OF BILATERAL TISSUE EXPANDERS WITH PLACEMENT OF BILATERAL BREAST IMPLANTS;  Surgeon: Irene Limbo, MD;  Location: [facility];  Service: Plastics;  Laterality: Bilateral;    Prior to Admission medications   Medication Sig Start Date End Date Taking? Authorizing Provider  acetaminophen (TYLENOL) 500 MG tablet Take 500 mg by mouth every 6 (six) hours as needed. Reported on 05/09/2015   Yes [provider]  Calcium Citrate-Vitamin D (CITRACAL PETITES/VITAMIN D) 200-250 MG-UNIT TABS Take 2 tablets by mouth daily.   Yes [provider]  Multiple Vitamins-Minerals (MULTIVITAMIN WITH MINERALS) tablet Take 1 tablet by mouth daily.   Yes [provider]  NONFORMULARY OR COMPOUNDED ITEM Estradiol 0.02 % 63ml prefilled applicator Sig: apply twice a week  Patient taking differently: Place 1 application vaginally daily as needed. Estradiol 0.02 % 4ml prefilled applicator Sig: apply prn 04/22/16  Yes Terrance Mass, MD  RABEprazole (ACIPHEX) 20 MG tablet Take  1 tablet (20 mg total) by mouth 2 (two) times daily. 05/07/17  Yes Ivette Castronova L, MD  venlafaxine (EFFEXOR) 37.5 MG tablet Take 1 tablet (37.5 mg total) by mouth daily. 05/28/17  Yes Estill Dooms, NP  albuterol (PROVENTIL HFA;VENTOLIN HFA) 108 (90 Base) MCG/ACT inhaler Inhale 1-2 puffs into the lungs every 4 (four) hours as needed for wheezing or shortness of breath.    [provider]    Allergies as of 05/07/2017 - Review Complete 05/07/2017  Allergen Reaction Noted  . Aspirin Other (See Comments) 02/23/2015  . Ioxaglate Hives 02/23/2015  . Ivp dye [iodinated diagnostic agents] Hives 10/25/2014  . Kapidex [dexlansoprazole] Diarrhea 11/12/2011  . Lidocaine Nausea Only 11/01/2014  . Metronidazole    . Nsaids    . Clarithromycin Rash     Family History  Problem Relation Age of Onset  . Dementia Mother   . Esophageal cancer Mother 71  . Hypertension Father   . Lung cancer Father 23       smoker  . Prostate cancer Brother 64  . Dementia Maternal Aunt   . Bladder Cancer Maternal Uncle 46       former smoker  . Heart attack Maternal Grandmother   . Dementia Maternal Grandfather   . Esophageal cancer Maternal Aunt 72       non-smoker  . Brain cancer Maternal Uncle 63       Meningioma - benign  . Esophageal cancer Maternal Uncle 61  . Colon cancer Neg Hx   . Colon polyps Neg Hx     Social History   Socioeconomic History  . Marital status: Married    Spouse name: Not on file  . Number of children: 3  . Years of education: Not on file  . Highest education level: Not on file  Occupational History  . Not on file  Social Needs  . Financial resource strain: Not on file  . Food insecurity:    Worry: Not on file    Inability: Not on file  . Transportation needs:    Medical: Not on file    Non-medical: Not on file  Tobacco Use  . Smoking status: Never Smoker  . Smokeless tobacco: Never Used  Substance and Sexual Activity  . Alcohol use: Yes     Alcohol/week: 0.0 oz    Comment: social  . Drug use: No  . Sexual activity: Yes    Birth control/protection: Surgical    Comment: hyst  Lifestyle  . Physical activity:    Days per week: Not on file    Minutes per session: Not on file  . Stress: Not on file  Relationships  . Social connections:    Talks on phone: Not on file    Gets together: Not on file    Attends religious service: Not on file    Active member of club or organization: Not on file    Attends meetings of clubs or organizations: Not on file    Relationship status: Not on file  . Intimate partner violence:    Fear of current or ex partner: Not on file    Emotionally abused: Not on file    Physically abused: Not on file    Forced sexual activity: Not on file  Other Topics Concern  .  Not on file  Social History Narrative  . Not on file    Review of Systems: See HPI, otherwise negative ROS   Physical Exam: BP 135/74   Pulse 70   Temp 98.7 F (37.1 C) (Oral)   Resp 10   Ht 5\' 7"  (1.702 m)   Wt 155 lb (70.3 kg)   SpO2 96%   BMI 24.28 kg/m  General:   Alert,  pleasant and cooperative in NAD Head:  Normocephalic and atraumatic. Neck:  Supple; Lungs:  Clear throughout to auscultation.    Heart:  Regular rate and rhythm. Abdomen:  Soft, nontender and nondistended. Normal bowel sounds, without guarding, and without rebound.   Neurologic:  Alert and  oriented x4;  grossly normal neurologically.  Impression/Plan:     DYSPEPSIA  PLAN:  EGD TODAY. DISCUSSED PROCEDURE, BENEFITS, & RISKS: < 1% chance of medication reaction, bleeding, OR perforation.

## 2017-06-22 NOTE — Op Note (Signed)
[facility] Patient Name: Angie Jennings Procedure Date: 06/22/2017 12:56 PM MRN: [id number] Date of Birth: 1951/02/18 Attending MD: Barney Drain MD, MD CSN: [id number] Age: 67 Admit Type: Outpatient Procedure:                Upper GI endoscopy WITH COLD FORCEPS BIOPSY Indications:              Dyspepsia, Heartburn on Aciphex initially qd and                            SYMPTOMS IMPROVED BUT NOT RESOLVED WITH ACIPHEX BID. Providers:                Barney Drain MD, MD, Rosina Lowenstein, RN, Aram Candela Referring MD:              Medicines:                Promethazine 12.5 mg IV, Meperidine 50 mg IV,                            Midazolam 4 mg IV Complications:            No immediate complications. Estimated Blood Loss:     Estimated blood loss was minimal. Procedure:                Pre-Anesthesia Assessment:                           - Prior to the procedure, a History and Physical                            was performed, and patient medications and                            allergies were reviewed. The patient's tolerance of                            previous anesthesia was also reviewed. The risks                            and benefits of the procedure and the sedation                            options and risks were discussed with the patient.                            All questions were answered, and informed consent                            was obtained. Prior Anticoagulants: The patient has                            taken no previous anticoagulant or antiplatelet                            agents. ASA Grade Assessment: II - A patient with  mild systemic disease. After reviewing the risks                            and benefits, the patient was deemed in                            satisfactory condition to undergo the procedure.                            After obtaining informed consent, the endoscope was                            passed under  direct vision. Throughout the                            procedure, the patient's blood pressure, pulse, and                            oxygen saturations were monitored continuously. The                            [id number] ([id number]) scope was introduced through the                            mouth, and advanced to the second part of duodenum.                            The upper GI endoscopy was accomplished without                            difficulty. The patient tolerated the procedure                            well. Scope In: 1:32:09 PM Scope Out: 1:40:24 PM Total Procedure Duration: 0 hours 8 minutes 15 seconds  Findings:      The examined esophagus was normal.      Multiple sessile polyps with no bleeding and no stigmata of recent       bleeding were found in the cardia and in the gastric fundus. The polyp       was removed with a cold biopsy forceps. Resection and retrieval were       complete.      Patchy mild inflammation characterized by congestion (edema), erosions       and erythema was found in the gastric antrum. Biopsies were taken with a       cold forceps for histology.      The examined duodenum was normal. Biopsies were taken with a cold       forceps for histology. Impression:               - Multiple BENIGN APPEARING gastric polyps.                           - MILD EROSIVE Gastritis. Biopsied.                           -  NO OBVIOUS SOURCE FOR DYSPEPSIA IDENTIFIED                           - Moderate Sedation:      Moderate (conscious) sedation was administered by the endoscopy nurse       and supervised by the endoscopist. The following parameters were       monitored: oxygen saturation, heart rate, blood pressure, and response       to care. Total physician intraservice time was 23 minutes. Recommendation:           - High fiber diet and low fat diet.                           - Continue present medications. ADD BACLOFEN QAC.                            OBTAIN  CT ABD W/ IV AND PO CONTRAST.                           - Await pathology results.                           - Return to my office in 3 months.                           - Patient has a contact number available for                            emergencies. The signs and symptoms of potential                            delayed complications were discussed with the                            patient. Return to normal activities tomorrow.                            Written discharge instructions were provided to the                            patient. Procedure Code(s):        --- Professional ---                           [phone], Esophagogastroduodenoscopy, flexible,                            transoral; with biopsy, single or multiple                           G0500, Moderate sedation services provided by the                            same physician or other qualified health care  professional performing a gastrointestinal                            endoscopic service that sedation supports,                            requiring the presence of an independent trained                            observer to assist in the monitoring of the                            patient's level of consciousness and physiological                            status; initial 15 minutes of intra-service time;                            patient age 29 years or older (additional time may                            be reported with [phone], as appropriate)                           [phone], Moderate sedation services provided by the                            same physician or other qualified health care                            professional performing the diagnostic or                            therapeutic service that the sedation supports,                            requiring the presence of an independent trained                            observer to assist in the monitoring of the                             patient's level of consciousness and physiological                            status; each additional 15 minutes intraservice                            time (List separately in addition to code for                            primary service) Diagnosis Code(s):        --- Professional ---  K31.7, Polyp of stomach and duodenum                           K29.70, Gastritis, unspecified, without bleeding                           R10.13, Epigastric pain                           R12, Heartburn CPT copyright 2017 [employer]. All rights reserved. The codes documented in this report are preliminary and upon coder review may  be revised to meet current compliance requirements. Barney Drain, MD Barney Drain MD, MD 06/22/2017 1:51:55 PM This report has been signed electronically. Number of Addenda: 0

## 2017-06-25 ENCOUNTER — Encounter: Payer: Self-pay | Admitting: Gastroenterology

## 2017-06-26 ENCOUNTER — Encounter (HOSPITAL_COMMUNITY): Payer: Self-pay | Admitting: Gastroenterology

## 2017-07-01 ENCOUNTER — Encounter: Payer: Self-pay | Admitting: Gastroenterology

## 2017-10-03 LAB — LIPID PANEL
CHOLESTEROL TOTAL: 225 mg/dL — AB (ref 100–199)
Chol/HDL Ratio: 6.3 ratio — ABNORMAL HIGH (ref 0.0–4.4)
HDL: 36 mg/dL — AB (ref >39)
LDL Calculated: 121 mg/dL — ABNORMAL HIGH (ref 0–99)
TRIGLYCERIDES: 340 mg/dL — AB (ref 0–149)
VLDL CHOLESTEROL CAL: 68 mg/dL — AB (ref 5–40)

## 2017-10-03 LAB — COMPREHENSIVE METABOLIC PANEL
ALT: 17 IU/L (ref 0–32)
AST: 19 IU/L (ref 0–40)
Albumin/Globulin Ratio: 1.7 (ref 1.2–2.2)
Albumin: 4 g/dL (ref 3.6–4.8)
Alkaline Phosphatase: 124 IU/L — ABNORMAL HIGH (ref 39–117)
BUN/Creatinine Ratio: 20 (ref 12–28)
BUN: 16 mg/dL (ref 8–27)
Bilirubin Total: 0.2 mg/dL (ref 0.0–1.2)
CALCIUM: 9.4 mg/dL (ref 8.7–10.3)
CO2: 24 mmol/L (ref 20–29)
CREATININE: 0.8 mg/dL (ref 0.57–1.00)
Chloride: 104 mmol/L (ref 96–106)
GFR calc Af Amer: 89 mL/min/{1.73_m2} (ref >59)
GFR, EST NON AFRICAN AMERICAN: 77 mL/min/{1.73_m2} (ref >59)
GLOBULIN, TOTAL: 2.3 g/dL (ref 1.5–4.5)
GLUCOSE: 99 mg/dL (ref 65–99)
Potassium: 4.6 mmol/L (ref 3.5–5.2)
SODIUM: 141 mmol/L (ref 134–144)
Total Protein: 6.3 g/dL (ref 6.0–8.5)

## 2017-10-03 LAB — HEMOGLOBIN A1C
Est. average glucose Bld gHb Est-mCnc: 120 mg/dL
Hgb A1c MFr Bld: 5.8 % — ABNORMAL HIGH (ref 4.8–5.6)

## 2017-10-03 LAB — CBC
HEMATOCRIT: 45.6 % (ref 34.0–46.6)
Hemoglobin: 15.3 g/dL (ref 11.1–15.9)
MCH: 30.6 pg (ref 26.6–33.0)
MCHC: 33.6 g/dL (ref 31.5–35.7)
MCV: 91 fL (ref 79–97)
Platelets: 260 10*3/uL (ref 150–450)
RBC: 5 x10E6/uL (ref 3.77–5.28)
RDW: 14 % (ref 12.3–15.4)
WBC: 7.2 10*3/uL (ref 3.4–10.8)

## 2017-10-03 LAB — VITAMIN D 25 HYDROXY (VIT D DEFICIENCY, FRACTURES): Vit D, 25-Hydroxy: 29.9 ng/mL — ABNORMAL LOW (ref 30.0–100.0)

## 2017-10-03 LAB — TSH: TSH: 2.83 u[IU]/mL (ref 0.450–4.500)

## 2017-10-03 LAB — MAGNESIUM: Magnesium: 2 mg/dL (ref 1.6–2.3)

## 2017-10-05 ENCOUNTER — Telehealth: Payer: Self-pay | Admitting: Adult Health

## 2017-10-05 ENCOUNTER — Encounter: Payer: Self-pay | Admitting: Adult Health

## 2017-10-05 DIAGNOSIS — E782 Mixed hyperlipidemia: Secondary | ICD-10-CM

## 2017-10-05 HISTORY — DX: Mixed hyperlipidemia: E78.2

## 2017-10-05 MED ORDER — SIMVASTATIN 20 MG PO TABS: 20.0000 mg | ORAL_TABLET | Freq: Every day | ORAL | 2 refills | Status: DC

## 2017-10-05 NOTE — Telephone Encounter (Signed)
Pt aware of labs, increase activity and watch diet(already eats lots of veggies, and not a lot of red meat but likes cheese), and will add zocor, and recheck labs in 12 weeks,placed in recall.

## 2017-10-15 ENCOUNTER — Encounter: Payer: Self-pay | Admitting: Gastroenterology

## 2017-10-15 ENCOUNTER — Ambulatory Visit: Payer: Medicare Other | Admitting: Gastroenterology

## 2017-10-15 DIAGNOSIS — R195 Other fecal abnormalities: Secondary | ICD-10-CM | POA: Diagnosis not present

## 2017-10-15 DIAGNOSIS — K219 Gastro-esophageal reflux disease without esophagitis: Secondary | ICD-10-CM | POA: Diagnosis not present

## 2017-10-15 NOTE — Assessment & Plan Note (Signed)
SYMPTOMS FAIRLY WELL CONTROLLED ON BACLOFEN/ACIPHEX BID.   CONTINUE ACIPHEX TWICE DAILY AND 1/2 BACLOFEN TWICE DAILY. FOLLOW UP IN 4 MOS.

## 2017-10-15 NOTE — Progress Notes (Signed)
Subjective:    Patient ID: Angie Jennings, female    DOB: 1950-03-13, 67 y.o.   MRN: [id number]  HPI Having #5 OR 6 stools most of the times. HAVING SOME INCONTINENCE. DIET HAS NOT CHANGE AND WONDERING IF IT'S THE BACLOFEN: 1/2 TABLET IN AM AND AT SUPPER. RECENT BLOOD WORK AND TRIGLYCERIDES ARE OF THE WALL. NOW ON ZOCOR. DOESN'T EAT A LOT OF MEAT. IF DOESN'T MISS EVENING DOSE DOES FINE. ADDED ZOCOR 1 WEEK AGO. ALL OF THE ANTI-REFLUX MEDS HAVE MADE HER HAVE LOOSE STOOLS. NOT WILLING OT CONSIDER REFLUX SURGERY. EATS YOGURT(GREEK-LOW FAT) OR CEREAL WITH MILK FOR BREAKFAST(EVERY DAY ONE OR THE OTHER). USING SPLENDA AS A SWEETNER. NON-DAIRY COFFEE CREAMER(COFFEE MATE). CAN'T WIPE CLEAN AND IT'S LIKE SHE HAS SEEPAGE. CHEESE: EVERY DAY. ICE CREAM: YES BUT NOT OFTEN(SUGAR FREE). ABDOMINAL PAIN IS BETTER. HEARTBURN CONTROLLED WITH PILLS ALL THE TIME BUT IF MISSES A DOSE SHE KNOWS IT.  PT DENIES FEVER, CHILLS, HEMATOCHEZIA, HEMATEMESIS, nausea, vomiting, melena, DIARRHEA, CHEST PAIN, SHORTNESS OF BREATH,  CHANGE IN BOWEL IN HABITS, constipation, problems swallowing, OR heartburn or indigestion.   Past Medical History:  Diagnosis Date  . Anxiety   . Arthritis    OA  . 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 Breast disorder   . Childhood asthma   . Elevated cholesterol with elevated triglycerides 10/05/2017   Add zocor  . GERD (gastroesophageal reflux disease)   . Radiation    as child for treatment of keloids on face and neck   Past Surgical History:  Procedure Laterality Date  . ABDOMINAL HYSTERECTOMY  2006  . back surgery  1995   fusion of L4 & L5  . bravo capsule endoscopy  05/02/09   GERD with adequate acid suppression  . BRAVO [place] STUDY  11/21/2011   Procedure: BRAVO [place];  Surgeon: Danie Binder, MD;  [facility];  Service: Endoscopy;  Laterality: N/A;  . BREAST BIOPSY  10/05/14  . BREAST RECONSTRUCTION WITH PLACEMENT OF TISSUE EXPANDER AND FLEX HD (ACELLULAR  HYDRATED DERMIS) Bilateral 11/01/2014   Procedure: BILATERAL BREAST RECONSTRUCTION WITH PLACEMENT OF TISSUE EXPANDER AND FLEX HD (ACELLULAR HYDRATED DERMIS);  Surgeon: Irene Limbo, MD;  Location: [facility];  Service: Plastics;  Laterality: Bilateral;  . BREAST SURGERY  AUGUST 2015   BREAST BIOPSY   . CATARACT EXTRACTION W/PHACO Right 07/23/2015   Procedure: CATARACT EXTRACTION PHACO AND INTRAOCULAR LENS PLACEMENT RIGHT EYE CDE=1.72;  Surgeon: Williams Che, MD;  Location: [facility];  Service: Ophthalmology;  Laterality: Right;  . CATARACT EXTRACTION W/PHACO Left 11/05/2015   Procedure: CATARACT EXTRACTION PHACO AND INTRAOCULAR LENS PLACEMENT; CDE:  3.17;  Surgeon: Williams Che, MD;  Location: [facility];  Service: Ophthalmology;  Laterality: Left;  . COLONOSCOPY  11/21/2011  . ESOPHAGOGASTRODUODENOSCOPY  04/20/09   mild gastritis  . ESOPHAGOGASTRODUODENOSCOPY  07/27/09   small internal hemorrhoids/rare sigmoid colon diverticula other wise no polyps  . ESOPHAGOGASTRODUODENOSCOPY  11/21/2011   SLF: 1. the mucosa of the esophagus appeared normal 2. NOn-erosive gastritis (inflammation) was found multiple biopsies 3. the duodenal mucosa showed no abnormalitis.   [name] Kitchen ESOPHAGOGASTRODUODENOSCOPY N/A 06/22/2017   Procedure: ESOPHAGOGASTRODUODENOSCOPY (EGD);  Surgeon: Danie Binder, MD;  [facility];  Service: Endoscopy;  Laterality: N/A;  1:00pm  . LIPOSUCTION WITH LIPOFILLING Bilateral 03/13/2015   Procedure: LIPO FILLING TO BILATERAL BREAST FROM ABDOMEN;  Surgeon: Irene Limbo, MD;  Location: [facility];  Service:  Plastics;  Laterality: Bilateral;  . NIPPLE SPARING MASTECTOMY/SENTINAL LYMPH NODE BIOPSY/RECONSTRUCTION/PLACEMENT OF TISSUE EXPANDER Bilateral 11/01/2014   Procedure: LEFT NIPPLE SPARING MASTECTOMY WITH LEFT  SENTINAL LYMPH NODE BIOPSY AND RIGHT PROPYLACTIC NIPPLE SPARING MASTECTOMY;  Surgeon: Rolm Bookbinder, MD;  Location: [facility];   Service: General;  Laterality: Bilateral;  . REMOVAL OF BILATERAL TISSUE EXPANDERS WITH PLACEMENT OF BILATERAL BREAST IMPLANTS Bilateral 03/13/2015   Procedure: REMOVAL OF BILATERAL TISSUE EXPANDERS WITH PLACEMENT OF BILATERAL BREAST IMPLANTS;  Surgeon: Irene Limbo, MD;  Location: [facility];  Service: Plastics;  Laterality: Bilateral;   Allergies  Allergen Reactions  . Nsaids Other (See Comments)    *Kidney failure  . Aspirin Other (See Comments)    Pt. states ASA makes her bleed.   . Ioxaglate Hives  . Ivp Dye [Iodinated Diagnostic Agents] Hives  . Kapidex [Dexlansoprazole] Diarrhea  . Lidocaine Nausea Only    Feels hot and flushing  . Metronidazole   . Clarithromycin Rash   Current Outpatient Medications  Medication Sig    . acetaminophen (TYLENOL) 500 MG tablet Take 500 mg by mouth every 6 (six) hours as needed. Reported on 05/09/2015    . albuterol  inhaler Inhale 1-2 puffs into the lungs every 4 (four) hours PRN for wheezing or shortness of breath.    . baclofen 10 MG tablet  1/2 tablet in am and 1/2 tablet at supper)    . Calcium Citrate-Vitamin D  200-250 MG-UNIT TABS Take 2 tablets by mouth daily.    . Multiple Vitamins-Minerals  tablet Take 1 tablet by mouth daily.    . NONFORMULARY OR COMPOUNDED ITEM Estradiol 0.02 % 59ml prefilled applicator Place 1 application vaginally daily as needed. Estradiol 0.02 % 29ml prefilled applicator Sig: apply prn)    . ACIPHEX 20 MG tablet Take 1 tablet BID.    [name] Kitchen ZOCOR 20 MG tablet Take 1 tablet (20 mg total) by mouth daily.    . EFFEXOR 37.5 MG tablet Take 1 tablet (37.5 mg total) by mouth daily. SINCE 2016 FOR HOT  FLASHES    Review of Systems PER HPI OTHERWISE ALL SYSTEMS ARE NEGATIVE.    Objective:   Physical Exam  Constitutional: She is oriented to person, place, and time. She appears well-developed and well-nourished. No distress.  HENT:  Head: Normocephalic and atraumatic.  Mouth/Throat: Oropharynx is clear  and moist. No oropharyngeal exudate.  Eyes: Pupils are equal, round, and reactive to light. No scleral icterus.  Neck: Normal range of motion. Neck supple.  Cardiovascular: Normal rate, regular rhythm and normal heart sounds.  Pulmonary/Chest: Effort normal and breath sounds normal. No respiratory distress.  Abdominal: Soft. Bowel sounds are normal. She exhibits no distension. There is no tenderness.  Musculoskeletal: She exhibits no edema.  Lymphadenopathy:    She has no cervical adenopathy.  Neurological: She is alert and oriented to person, place, and time.  NO FOCAL DEFICITS  Psychiatric: She has a normal mood and affect.  Vitals reviewed.     Assessment & Plan:

## 2017-10-15 NOTE — Assessment & Plan Note (Addendum)
SYMPTOMS NOT IDEALLY CONTROLLED & MOST LIKELY DUE TO DAIRY INTAKE/LACTOSE INTOLERANCE, DOUBT DUE TO BACLOFEN OR ACIPHEX.   DRINK WATER TO KEEP YOUR URINE LIGHT YELLOW. FOLLOW A LOW LACTOSE DIET FOR AT LEAST ONE MONTH. Use Lactaid milk with cereal. AVOID YOGURT.  HANDOUT GIVEN. IF YOU CONSUME CHEESE OR OTHER DAIRY PRODUCTS, ADD LACTASE 3 PILLS WITH MEALS UP TO THREE TIMES A DAY. CONTACT ME IN ONE MONTH TO LET ME KNOW IF THINGS ARE BETTER, SAME, OR WORSE.   FOLLOW UP IN 4 MOS.

## 2017-10-15 NOTE — Progress Notes (Signed)
ON RECALL  °

## 2017-10-15 NOTE — Patient Instructions (Signed)
DRINK WATER TO KEEP YOUR URINE LIGHT YELLOW.  FOLLOW A LOW LACTOSE DIET FOR AT LEAST ONE MONTH. Use Lactaid milk with cereal. AVOID YOGURT. SEE INFO BELOW.  IF YOU CONSUME CHEESE OR OTHER DAIRY PRODUCTS, ADD LACTASE 3 PILLS WITH MEALS UP TO THREE TIMES A DAY.  CONTINUE ACIPHEX TWICE DAILY AND 1/2 BACLOFEN TWICE DAILY.  PLEASE CONTACT ME IN ONE MONTH TO LET ME KNOW IF THINGS ARE BETTER, SAME, OR WORSE.    FOLLOW UP IN 4 MOS.     Lactose Free Diet Lactose is a carbohydrate that is found mainly in milk and milk products, as well as in foods with added milk or whey. Lactose must be digested by the enzyme in order to be used by the body. Lactose intolerance occurs when there is a shortage of lactase. When your body is not able to digest lactose, you may feel sick to your stomach (nausea), bloating, cramping, gas and diarrhea.  There are many dairy products that may be tolerated better than milk by some people:  The use of cultured dairy products such as buttermilk, cottage cheese, and sweet acidophilus milk (Kefir) for lactase-deficient individuals is usually well tolerated. This is because the healthy bacteria help digest lactose.   Lactose-hydrolyzed milk (Lactaid) contains 40-90% less lactose than milk and may also be well tolerated.    SPECIAL NOTES  Lactose is a carbohydrates. The major food source is dairy products. Reading food labels is important. Many products contain lactose even when they are not made from milk. Look for the following words: whey, milk solids, dry milk solids, nonfat dry milk powder. Typical sources of lactose other than dairy products include breads, candies, cold cuts, prepared and processed foods, and commercial sauces and gravies.   All foods must be prepared without milk, cream, or other dairy foods.   Soy milk and lactose-free supplements (LACTASE) may be used as an alternative to milk.   FOOD GROUP ALLOWED/RECOMMENDED AVOID/USE SPARINGLY  BREADS /  STARCHES 4 servings or more* Breads and rolls made without milk. [place], [place], or [place] bread. Breads and rolls that contain milk. Prepared mixes such as muffins, biscuits, waffles, pancakes. Sweet rolls, donuts, [place] toast (if made with milk or lactose).  Crackers: Soda crackers, graham crackers. Any crackers prepared without lactose. Zwieback crackers, corn curls, or any that contain lactose.  Cereals: Cooked or dry cereals prepared without lactose (read labels). Cooked or dry cereals prepared with lactose (read labels). Total, Cocoa Krispies. Special K.  Potatoes / Pasta / Rice: Any prepared without milk or lactose. Popcorn. Instant potatoes, frozen [place] fries, scalloped or au gratin potatoes.  VEGETABLES 2 servings or more Fresh, frozen, and canned vegetables. Creamed or breaded vegetables. Vegetables in a cheese sauce or with lactose-containing margarines.  FRUIT 2 servings or more All fresh, canned, or frozen fruits that are not processed with lactose. Any canned or frozen fruits processed with lactose.  MEAT & SUBSTITUTES 2 servings or more (4 to 6 oz. total per day) Plain beef, chicken, fish, [place], lamb, veal, pork, or ham. Kosher prepared meat products. Strained or junior meats that do not contain milk. Eggs, soy meat substitutes, nuts. Scrambled eggs, omelets, and souffles that contain milk. Creamed or breaded meat, fish, or fowl. Sausage products such as wieners, liver sausage, or cold cuts that contain milk solids. Cheese, cottage cheese, or cheese spreads.  MILK None EXCEPT LACTAID Milk (whole, 2%, skim, or chocolate). Evaporated, powdered, or condensed milk; malted milk.  SOUPS & COMBINATION  FOODS Bouillon, broth, vegetable soups, clear soups, consomms. Homemade soups made with allowed ingredients. Combination or prepared foods that do not contain milk or milk products (read labels). Cream soups, chowders, commercially prepared soups containing lactose. Macaroni and cheese,  pizza. Combination or prepared foods that contain milk or milk products.  DESSERTS & SWEETS In moderation Water and fruit ices; gelatin; angel food cake. Homemade cookies, pies, or cakes made from allowed ingredients. Pudding (if made with water or a milk substitute). Lactose-free tofu desserts. Sugar, honey, corn syrup, jam, jelly; marmalade; molasses (beet sugar); Pure sugar candy; marshmallows. Ice cream, ice milk, sherbet, custard, pudding, frozen yogurt. Commercial cake and cookie mixes. Desserts that contain chocolate. Pie crust made with milk-containing margarine; reduced-calorie desserts made with a sugar substitute that contains lactose. Toffee, peppermint, butterscotch, chocolate, caramels.  FATS & OILS In moderation Butter (as tolerated; contains very small amounts of lactose). Margarines and dressings that do not contain milk, Vegetable oils, shortening, Miracle Whip, mayonnaise, nondairy cream & whipped toppings without lactose or milk solids added (examples: Coffee Rich, Carnation Coffeemate, Rich's Whipped Topping, PolyRich). Margarines and salad dressings containing milk; cream, cream cheese; peanut butter with added milk solids, sour cream, chip dips, made with sour cream.  BEVERAGES Fruit drinks; fruit and vegetable juice; Rice or Soy milk. Powdered fruit drinks (read labels).   CONDIMENTS / MISCELLANEOUS Soy sauce, carob powder, olives, gravy made with water, baker's cocoa, pickles, pure seasonings and spices, wine, pure monosodium glutamate, catsup, mustard. Some chewing gums, chocolate, some cocoas. Certain antibiotics and vitamin / mineral preparations. Spice blends if they contain milk products. MSG extender. Artificial sweeteners that contain lactose such as Equal (Nutra-Sweet) and Sweet 'n Low. Some nondairy creamers (read labels).   SAMPLE MENU*  Breakfast   Orange Juice.  Banana.   Bran flakes.   Nondairy Creamer.  [place] Bread (toasted).   Butter or milk-free  margarine.   Coffee or tea.    Noon Meal   Chicken Breast.  Rice.   Green beans.   Butter or milk-free margarine.  Fresh melon.   Coffee or tea.    Evening Meal   Roast Beef.  Baked potato.   Butter or milk-free margarine.   Broccoli.   Lettuce salad with vinegar and oil dressing.  [employer]   Coffee or tea.

## 2017-10-15 NOTE — Progress Notes (Signed)
cc'ed to pcp °

## 2017-12-04 ENCOUNTER — Other Ambulatory Visit: Payer: Self-pay | Admitting: Adult Health

## 2017-12-09 ENCOUNTER — Encounter: Payer: Self-pay | Admitting: Gastroenterology

## 2017-12-11 ENCOUNTER — Encounter (HOSPITAL_COMMUNITY): Payer: Self-pay | Admitting: Hematology

## 2017-12-11 ENCOUNTER — Inpatient Hospital Stay (HOSPITAL_COMMUNITY): Payer: Medicare Other | Attending: Hematology | Admitting: Hematology

## 2017-12-11 ENCOUNTER — Other Ambulatory Visit: Payer: Self-pay

## 2017-12-11 VITALS — BP 95/75 | HR 76 | Temp 98.0°F | Resp 16 | Wt 160.0 lb

## 2017-12-11 DIAGNOSIS — M8580 Other specified disorders of bone density and structure, unspecified site: Secondary | ICD-10-CM | POA: Insufficient documentation

## 2017-12-11 DIAGNOSIS — C50912 Malignant neoplasm of unspecified site of left female breast: Secondary | ICD-10-CM

## 2017-12-11 DIAGNOSIS — Z23 Encounter for immunization: Secondary | ICD-10-CM | POA: Insufficient documentation

## 2017-12-11 DIAGNOSIS — Z171 Estrogen receptor negative status [ER-]: Secondary | ICD-10-CM

## 2017-12-11 DIAGNOSIS — D0512 Intraductal carcinoma in situ of left breast: Secondary | ICD-10-CM | POA: Insufficient documentation

## 2017-12-11 DIAGNOSIS — Z0000 Encounter for general adult medical examination without abnormal findings: Secondary | ICD-10-CM

## 2017-12-11 MED ORDER — INFLUENZA VAC SPLIT HIGH-DOSE 0.5 ML IM SUSY
0.5000 mL | PREFILLED_SYRINGE | INTRAMUSCULAR | Status: AC
  Administered 2017-12-11: 0.5 mL via INTRAMUSCULAR
  Filled 2017-12-11: qty 0.5

## 2017-12-11 NOTE — Patient Instructions (Signed)
[facility] at [facility] Discharge Instructions  Today you saw Dr. Delton Coombes and received your flu vaccine high dose.   Thank you for choosing [facility] at [facility] to provide your oncology and hematology care.  To afford each patient quality time with our provider, please arrive at least 15 minutes before your scheduled appointment time.   If you have a lab appointment with the [facility] please come in thru the  Main Entrance and check in at the main information desk  You need to re-schedule your appointment should you arrive 10 or more minutes late.  We strive to give you quality time with our providers, and arriving late affects you and other patients whose appointments are after yours.  Also, if you no show three or more times for appointments you may be dismissed from the clinic at the providers discretion.     Again, thank you for choosing [facility].  Our hope is that these requests will decrease the amount of time that you wait before being seen by our physicians.       _____________________________________________________________  Should you have questions after your visit to [facility], please contact our office at [phone] between the hours of 8:00 a.m. and 4:30 p.m.  Voicemails left after 4:00 p.m. will not be returned until the following business day.  For prescription refill requests, have your pharmacy contact our office and allow 72 hours.    Cancer Center Support Programs:   > Cancer Support Group  2nd Tuesday of the month 1pm-2pm, Journey Room

## 2017-12-11 NOTE — Progress Notes (Signed)
Patient Care Team: [name], MD as PCP - General (Internal Medicine) Danie Binder, MD (Gastroenterology) Rolm Bookbinder, MD as Consulting Physician (General Surgery) Irene Limbo, MD as Consulting Physician (Plastic Surgery) Sylvan Cheese, NP as Nurse Practitioner (Hematology and Oncology) Whitney Muse, Kelby Fam, MD (Inactive) as Consulting Physician (Hematology and Oncology)  DIAGNOSIS:  Encounter Diagnoses  Name Primary?  . Healthcare maintenance Yes  . Malignant neoplasm of left breast in female, estrogen receptor negative, unspecified site of breast ([place])   . Ductal carcinoma in situ (DCIS) of left breast     SUMMARY OF ONCOLOGIC HISTORY:   Breast cancer ([place])   09/27/2014 Mammogram    Left breast: new area of calcifications    10/05/2014 Breast US    Left breast: developing grouped linear calcifications following a ductal pattern spanning an area of 2.7 cm.    10/05/2014 Initial Biopsy    Left breast core needle bx: high grade DCIS, ER- (0%), PR- (0%)    10/12/2014 Breast MRI    Left breast: nonmass enhancement in LOQ greater than that visualized on mammography; contiguous enhancement posterior and medial to the biopsied calcifications which does not have a mammographic correlate.    10/12/2014 Clinical Stage    Stage 0: Tis N0    10/16/2014 Procedure    Genetic testing: Breast/Ovarian panel ([facility]) revealed no clinically significant variant at ATM, BARD1, BRCA1, BRCA2, BRIP1, CDH1, CHEK2, EPCAM, FANCC, MLH1, MSH2, MSH6, NBN, PALB2, PMS2, PTEN, RAD51C, RAD51D, TP53, and XRCC2.    11/01/2014 Definitive Surgery    Bilateral mastectomy / SLNB with immediate reconstruction.  RIGHT: negative for malignancy.  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 1 LN removed and negative (0/1)    11/01/2014 Pathologic Stage    Stage 0: Tis N0    11/30/2014 Survivorship    Survivorship visit completed and copy of care plan provided to  patient    03/13/2015 Procedure    1. Removal bilateral tissue expanders and placement silicone implants 2. Lipofilling to bilateral chest  SURGEON: Irene Limbo MD MBA     CHIEF COMPLIANT: Follow-up of left breast DCIS.  INTERVAL HISTORY: Angie Jennings is a very pleasant female who is seen for follow-up of her left breast DCIS.  Denies any fevers, night sweats or weight loss in the last 6 months to 1 year.  She reportedly had EGD done on 06/22/2017 for her GERD follow-up.  Denies any infections or hospitalizations.  She is continuing calcium and vitamin D supplements.  REVIEW OF SYSTEMS:   Constitutional: Denies fevers, chills or abnormal weight loss Eyes: Denies blurriness of vision Ears, nose, mouth, throat, and face: Denies mucositis or sore throat Respiratory: Denies cough, dyspnea or wheezes Cardiovascular: Denies palpitation, chest discomfort Gastrointestinal: Denies any nausea or vomiting.  Positive for diarrhea. Skin: Denies abnormal skin rashes Lymphatics: Denies new lymphadenopathy or easy bruising Neurological:Denies numbness, tingling or new weaknesses Behavioral/Psych: Mood is stable, no new changes  Extremities: No lower extremity edema Breast:  denies any pain or lumps or nodules in either breasts/implants. All other systems were reviewed with the patient and are negative.  I have reviewed the past medical history, past surgical history, social history and family history with the patient and they are unchanged from previous note.  ALLERGIES:  is allergic to nsaids; aspirin; ioxaglate; ivp dye [iodinated diagnostic agents]; kapidex [dexlansoprazole]; lidocaine; metronidazole; and clarithromycin.  MEDICATIONS:  Current Outpatient Medications  Medication Sig Dispense Refill  . acetaminophen (  TYLENOL) 500 MG tablet Take 500 mg by mouth every 6 (six) hours as needed. Reported on 05/09/2015    . albuterol (PROVENTIL HFA;VENTOLIN HFA) 108 (90 Base) MCG/ACT inhaler  Inhale 1-2 puffs into the lungs every 4 (four) hours as needed for wheezing or shortness of breath.    . Baclofen 5 MG TABS Take 5 mg by mouth 2 (two) times daily.    . Calcium Citrate-Vitamin D (CITRACAL PETITES/VITAMIN D) 200-250 MG-UNIT TABS Take 2 tablets by mouth daily.    . Multiple Vitamins-Minerals (MULTIVITAMIN WITH MINERALS) tablet Take 1 tablet by mouth daily as needed.     . NONFORMULARY OR COMPOUNDED ITEM Estradiol 0.02 % 45m prefilled applicator Sig: apply twice a week (Patient taking differently: Place 1 application vaginally daily as needed. Estradiol 0.02 % 180mprefilled applicator Sig: apply prn) 24 each 4  . RABEprazole (ACIPHEX) 20 MG tablet Take 1 tablet (20 mg total) by mouth 2 (two) times daily. 60 tablet 11  . simvastatin (ZOCOR) 20 MG tablet TAKE 1 TABLET BY MOUTH ONCE A DAY. 30 tablet 0  . venlafaxine (EFFEXOR) 37.5 MG tablet Take 1 tablet (37.5 mg total) by mouth daily. 90 tablet 3   No current facility-administered medications for this visit.     PHYSICAL EXAMINATION: ECOG PERFORMANCE STATUS: 0 - Asymptomatic  Vitals:   12/11/17 1021  BP: 95/75  Pulse: 76  Resp: 16  Temp: 98 F (36.7 C)  SpO2: 96%   Filed Weights   12/11/17 1021  Weight: 160 lb (72.6 kg)    GENERAL:alert, no distress and comfortable SKIN: skin color, texture, turgor are normal, no rashes or significant lesions EYES: normal, Conjunctiva are pink and non-injected, sclera clear OROPHARYNX:no mucositis, no erythema and lips, buccal mucosa, and tongue normal  NECK: supple, thyroid normal size, non-tender, without nodularity LYMPH:  no palpable lymphadenopathy in the cervical, axillary or inguinal LUNGS: clear to auscultation and percussion with normal breathing effort HEART: Regular rate and rhythm.  No peripheral edema. ABDOMEN:abdomen soft, non-tender and normal bowel sounds MUSCULOSKELETAL:no cyanosis of digits and no clubbing  EXTREMITIES: No lower extremity edema BREAST:  Bilateral breast implants showed textured implants.  There were normal limits.  There is a cystic area in the right upper anterior chest wall which is an oil cyst per patient.  No axillary or supraclavicular adenopathy.  Bilateral nipple areolar complex within normal limits.  LABORATORY DATA:  I have reviewed the data as listed CMP Latest Ref Rng & Units 10/02/2017 10/30/2015 07/17/2015  Glucose 65 - 99 mg/dL 99 80 95  BUN 8 - 27 mg/dL _0 Creatinine 0.57 - 1.00 mg/dL 0.80 0.92 0.71  Sodium 134 - 144 mmol/L 141 139 138  Potassium 3.5 - 5.2 mmol/L 4.6 4.9 4.6  Chloride 96 - 106 mmol/L 104 104 104  CO2 20 - 29 mmol/L _1 Calcium 8.7 - 10.3 mg/dL 9.4 9.6 9.4  Total Protein 6.0 - 8.5 g/dL 6.3 - -  Total Bilirubin 0.0 - 1.2 mg/dL 0.2 - -  Alkaline Phos 39 - 117 IU/L 124(H) - -  AST 0 - 40 IU/L 19 - -  ALT 0 - 32 IU/L 17 - -   No results found for: [id number] Lab Results  Component Value Date   WBC 7.2 10/02/2017   HGB 15.3 10/02/2017   HCT 45.6 10/02/2017   MCV 91 10/02/2017   PLT 260 10/02/2017   NEUTROABS 3.7 10/27/2014    ASSESSMENT &  PLAN:  Ductal carcinoma in situ (DCIS) of left breast 1.  Left breast high-grade DCIS: - Initial diagnosed by biopsy on 10/05/2014 showing high-grade DCIS, ER/PR negative - Bilateral nipple sparing mastectomy on 11/01/2014.  Hereditary mutation testing for breast cancer was reportedly negative. - She had textured breast implants and follows up with her plastic surgeon. - Today's physical examination did not reveal any suspicious areas.  Her blood work in August showed mild elevation of alk phos.  She also had elevated triglycerides for which she was started on statin after the blood work.  She has a follow-up blood work in a month. -She will come back in 1 year for follow-up.   2.  Osteopenia: -DEXA scan on 04/29/2016 shows osteopenia which has been stable.  She is taking calcium and vitamin D supplements.  3.  GERD: -She underwent EGD on  06/22/2017 which shows normal esophagus, multiple sessile polyps in the cardia and fundus of the stomach which were biopsied and found to be benign. - She has a mother who had esophageal cancer in her 53s, maternal aunt who had esophageal cancer in her 60s and maternal uncle who developed the same cancer in his 26s.  They were all smokers and drank alcohol.   Breast Cancer therapy associated bone loss: I have recommended continuing calcium, Vitamin D and weight bearing exercises.   Orders Placed This Encounter  Procedures  . CBC with Differential    Standing Status:   Future    Standing Expiration Date:   12/11/2018  . Comprehensive metabolic panel    Standing Status:   Future    Standing Expiration Date:   12/11/2018  . Vitamin D 25 hydroxy    Standing Status:   Future    Standing Expiration Date:   06/12/2019   The patient has a good understanding of the overall plan. she agrees with it. she will call with any problems that may develop before the next visit here.   Derek Jack, MD 12/11/17

## 2017-12-11 NOTE — Assessment & Plan Note (Signed)
1.  Left breast high-grade DCIS: - Initial diagnosed by biopsy on 10/05/2014 showing high-grade DCIS, ER/PR negative - Bilateral nipple sparing mastectomy on 11/01/2014.  Hereditary mutation testing for breast cancer was reportedly negative. - She had textured breast implants and follows up with her plastic surgeon. - Today's physical examination did not reveal any suspicious areas.  Her blood work in August showed mild elevation of alk phos.  She also had elevated triglycerides for which she was started on statin after the blood work.  She has a follow-up blood work in a month. -She will come back in 1 year for follow-up.   2.  Osteopenia: -DEXA scan on 04/29/2016 shows osteopenia which has been stable.  She is taking calcium and vitamin D supplements.  3.  GERD: -She underwent EGD on 06/22/2017 which shows normal esophagus, multiple sessile polyps in the cardia and fundus of the stomach which were biopsied and found to be benign. - She has a mother who had esophageal cancer in her 41s, maternal aunt who had esophageal cancer in her 21s and maternal uncle who developed the same cancer in his 38s.  They were all smokers and drank alcohol.

## 2018-02-19 ENCOUNTER — Other Ambulatory Visit: Payer: Self-pay | Admitting: Adult Health

## 2018-03-26 ENCOUNTER — Other Ambulatory Visit: Payer: Self-pay | Admitting: Adult Health

## 2018-03-26 MED ORDER — SIMVASTATIN 20 MG PO TABS: ORAL_TABLET | ORAL | 0 refills | Status: DC

## 2018-03-26 NOTE — Progress Notes (Signed)
Will refill zocor for 30 needs labs

## 2018-03-29 ENCOUNTER — Other Ambulatory Visit: Payer: Self-pay | Admitting: Adult Health

## 2018-05-13 ENCOUNTER — Telehealth: Payer: Self-pay | Admitting: Gastroenterology

## 2018-05-13 MED ORDER — RABEPRAZOLE SODIUM 20 MG PO TBEC: 20.0000 mg | DELAYED_RELEASE_TABLET | Freq: Two times a day (BID) | ORAL | 11 refills | Status: DC

## 2018-05-13 NOTE — Telephone Encounter (Signed)
Pt called to make OV follow up and said she would be needing refills of her Aciphex 20 mg called into [place] and would need enough to hold her until her appt in June.

## 2018-05-13 NOTE — Telephone Encounter (Signed)
done

## 2018-05-13 NOTE — Telephone Encounter (Signed)
Forwarding to the refill box.  

## 2018-05-13 NOTE — Telephone Encounter (Signed)
Pt is aware refills have been sent in.

## 2018-06-14 ENCOUNTER — Other Ambulatory Visit: Payer: Self-pay | Admitting: Adult Health

## 2018-06-23 ENCOUNTER — Other Ambulatory Visit: Payer: Self-pay | Admitting: Adult Health

## 2018-07-30 ENCOUNTER — Telehealth: Payer: Self-pay | Admitting: Adult Health

## 2018-07-30 DIAGNOSIS — E782 Mixed hyperlipidemia: Secondary | ICD-10-CM

## 2018-07-30 DIAGNOSIS — Z131 Encounter for screening for diabetes mellitus: Secondary | ICD-10-CM

## 2018-07-30 DIAGNOSIS — Z1321 Encounter for screening for nutritional disorder: Secondary | ICD-10-CM

## 2018-07-30 DIAGNOSIS — Z1329 Encounter for screening for other suspected endocrine disorder: Secondary | ICD-10-CM

## 2018-07-30 DIAGNOSIS — Z130 Encounter for screening for diseases of the blood and blood-forming organs and certain disorders involving the immune mechanism: Secondary | ICD-10-CM

## 2018-07-30 NOTE — Telephone Encounter (Signed)
She wants labs Monday and will make tele visit

## 2018-08-03 ENCOUNTER — Telehealth: Payer: Self-pay | Admitting: Adult Health

## 2018-08-03 LAB — VITAMIN D 25 HYDROXY (VIT D DEFICIENCY, FRACTURES): Vit D, 25-Hydroxy: 38.4 ng/mL (ref 30.0–100.0)

## 2018-08-03 LAB — CBC
Hematocrit: 45 % (ref 34.0–46.6)
Hemoglobin: 14.9 g/dL (ref 11.1–15.9)
MCH: 29.9 pg (ref 26.6–33.0)
MCHC: 33.1 g/dL (ref 31.5–35.7)
MCV: 90 fL (ref 79–97)
Platelets: 249 10*3/uL (ref 150–450)
RBC: 4.98 x10E6/uL (ref 3.77–5.28)
RDW: 13.4 % (ref 11.7–15.4)
WBC: 7 10*3/uL (ref 3.4–10.8)

## 2018-08-03 LAB — COMPREHENSIVE METABOLIC PANEL
ALT: 19 IU/L (ref 0–32)
AST: 20 IU/L (ref 0–40)
Albumin/Globulin Ratio: 2 (ref 1.2–2.2)
Albumin: 4.2 g/dL (ref 3.8–4.8)
Alkaline Phosphatase: 102 IU/L (ref 39–117)
BUN/Creatinine Ratio: 14 (ref 12–28)
BUN: 12 mg/dL (ref 8–27)
Bilirubin Total: 0.3 mg/dL (ref 0.0–1.2)
CO2: 23 mmol/L (ref 20–29)
Calcium: 9.6 mg/dL (ref 8.7–10.3)
Chloride: 105 mmol/L (ref 96–106)
Creatinine, Ser: 0.85 mg/dL (ref 0.57–1.00)
GFR calc Af Amer: 83 mL/min/{1.73_m2} (ref >59)
GFR calc non Af Amer: 72 mL/min/{1.73_m2} (ref >59)
Globulin, Total: 2.1 g/dL (ref 1.5–4.5)
Glucose: 101 mg/dL — ABNORMAL HIGH (ref 65–99)
Potassium: 4.7 mmol/L (ref 3.5–5.2)
Sodium: 142 mmol/L (ref 134–144)
Total Protein: 6.3 g/dL (ref 6.0–8.5)

## 2018-08-03 LAB — LIPID PANEL
Chol/HDL Ratio: 3.6 ratio (ref 0.0–4.4)
Cholesterol, Total: 137 mg/dL (ref 100–199)
HDL: 38 mg/dL — ABNORMAL LOW (ref >39)
LDL Calculated: 67 mg/dL (ref 0–99)
Triglycerides: 159 mg/dL — ABNORMAL HIGH (ref 0–149)
VLDL Cholesterol Cal: 32 mg/dL (ref 5–40)

## 2018-08-03 LAB — TSH: TSH: 2.89 u[IU]/mL (ref 0.450–4.500)

## 2018-08-03 LAB — HEMOGLOBIN A1C
Est. average glucose Bld gHb Est-mCnc: 117 mg/dL
Hgb A1c MFr Bld: 5.7 % — ABNORMAL HIGH (ref 4.8–5.6)

## 2018-08-03 MED ORDER — VENLAFAXINE HCL 37.5 MG PO TABS: 37.5000 mg | ORAL_TABLET | Freq: Every day | ORAL | 3 refills | Status: DC

## 2018-08-03 MED ORDER — SIMVASTATIN 20 MG PO TABS: ORAL_TABLET | ORAL | 12 refills | Status: DC

## 2018-08-03 NOTE — Telephone Encounter (Signed)
Pt aware of labs, and they are good,  refilled Zocor and Effexor

## 2018-08-06 ENCOUNTER — Other Ambulatory Visit: Payer: Self-pay

## 2018-08-06 ENCOUNTER — Ambulatory Visit: Payer: Medicare Other | Admitting: Gastroenterology

## 2018-08-06 ENCOUNTER — Encounter: Payer: Self-pay | Admitting: Gastroenterology

## 2018-08-06 VITALS — BP 135/78 | HR 73 | Temp 97.3°F | Ht 67.5 in | Wt 154.4 lb

## 2018-08-06 DIAGNOSIS — R195 Other fecal abnormalities: Secondary | ICD-10-CM | POA: Diagnosis not present

## 2018-08-06 DIAGNOSIS — K219 Gastro-esophageal reflux disease without esophagitis: Secondary | ICD-10-CM | POA: Diagnosis not present

## 2018-08-06 MED ORDER — BACLOFEN 5 MG PO TABS: 2.5000 mg | ORAL_TABLET | Freq: Two times a day (BID) | ORAL | 3 refills | Status: AC

## 2018-08-06 NOTE — Progress Notes (Addendum)
REVIEWED-NO ADDITIONAL RECOMMENDATIONS.  Primary Care Physician: [name], MD  Primary Gastroenterologist:  Barney Drain, MD   Chief Complaint  Patient presents with  . Follow-up    Needs refill on Baclofen    HPI: Angie Jennings is a 68 y.o. female here for follow up of GERD and loose stools. Last seen in 09/2017.   She has noted stools are much improved now that she is using Lactaid with dairy products. Having Bristol 4 stools now. No melena, brbpr. No abdominal pain. She notes if she misses the evening dose of Aciphex she will have burning the next morning. Heartburn is well controlled on current regimen of Aciphex BID and Baclofen 2.5mg  BID. No dysphagia. She is trying to loose another 10-15 pounds.   Due for TCS 07/2019, last colonoscopy in 2011 with rare diverticula, and hemorrhoids.   Current Outpatient Medications  Medication Sig Dispense Refill  . acetaminophen (TYLENOL) 500 MG tablet Take 500 mg by mouth every 6 (six) hours as needed. Reported on 05/09/2015    . albuterol (PROVENTIL HFA;VENTOLIN HFA) 108 (90 Base) MCG/ACT inhaler Inhale 1-2 puffs into the lungs every 4 (four) hours as needed for wheezing or shortness of breath.    . Baclofen 5 MG TABS Take 5 mg by mouth 2 (two) times daily. Takes 1/2 tablet 2 times daily.    . Calcium Citrate-Vitamin D (CITRACAL PETITES/VITAMIN D) 200-250 MG-UNIT TABS Take 2 tablets by mouth daily.    . Multiple Vitamins-Minerals (MULTIVITAMIN WITH MINERALS) tablet Take 1 tablet by mouth daily as needed.     . RABEprazole (ACIPHEX) 20 MG tablet Take 1 tablet (20 mg total) by mouth 2 (two) times daily before a meal. 60 tablet 11  . simvastatin (ZOCOR) 20 MG tablet Take 1 daily 30 tablet 12  . venlafaxine (EFFEXOR) 37.5 MG tablet Take 1 tablet (37.5 mg total) by mouth daily. 90 tablet 3  . NONFORMULARY OR COMPOUNDED ITEM Estradiol 0.02 % 16ml prefilled applicator Sig: apply twice a week (Patient not taking: Reported on 08/06/2018) 24 each  4   No current facility-administered medications for this visit.     Allergies as of 08/06/2018 - Review Complete 08/06/2018  Allergen Reaction Noted  . Nsaids Other (See Comments)   . Aspirin Other (See Comments) 02/23/2015  . Ioxaglate Hives 02/23/2015  . Ivp dye [iodinated diagnostic agents] Hives 10/25/2014  . Kapidex [dexlansoprazole] Diarrhea 11/12/2011  . Lidocaine Nausea Only 11/01/2014  . Metronidazole    . Clarithromycin Rash     ROS:  General: Negative for anorexia, weight loss, fever, chills, fatigue, weakness. ENT: Negative for hoarseness, difficulty swallowing , nasal congestion. CV: Negative for chest pain, angina, palpitations, dyspnea on exertion, peripheral edema.  Respiratory: Negative for dyspnea at rest, dyspnea on exertion, cough, sputum, wheezing.  GI: See history of present illness. GU:  Negative for dysuria, hematuria, urinary incontinence, urinary frequency, nocturnal urination.  Endo: Negative for unusual weight change.    Physical Examination:   BP 135/78   Pulse 73   Temp (!) 97.3 F (36.3 C)   Ht 5' 7.5" (1.715 m)   Wt 154 lb 6.4 oz (70 kg)   BMI 23.83 kg/m   General: Well-nourished, well-developed in no acute distress.  Eyes: No icterus. Mouth: Oropharyngeal mucosa moist and pink , no lesions erythema or exudate. Lungs: Clear to auscultation bilaterally.  Heart: Regular rate and rhythm, no murmurs rubs or gallops.  Abdomen: Bowel sounds are normal, nontender, nondistended, no hepatosplenomegaly or  masses, no abdominal bruits or hernia , no rebound or guarding.   Extremities: No lower extremity edema. No clubbing or deformities. Neuro: Alert and oriented x 4   Skin: Warm and dry, no jaundice.   Psych: Alert and cooperative, normal mood and affect.  Labs:  Lab Results  Component Value Date   CREATININE 0.85 08/02/2018   BUN 12 08/02/2018   NA 142 08/02/2018   K 4.7 08/02/2018   CL 105 08/02/2018   CO2 23 08/02/2018   Lab Results   Component Value Date   ALT 19 08/02/2018   AST 20 08/02/2018   ALKPHOS 102 08/02/2018   BILITOT 0.3 08/02/2018   Lab Results  Component Value Date   WBC 7.0 08/02/2018   HGB 14.9 08/02/2018   HCT 45.0 08/02/2018   MCV 90 08/02/2018   PLT 249 08/02/2018    Imaging Studies: No results found.

## 2018-08-06 NOTE — Assessment & Plan Note (Signed)
Doing much better.  Symptoms due to dairy intake/lactose intolerance.  Adding Lactaid has helped significantly.

## 2018-08-06 NOTE — Assessment & Plan Note (Signed)
Symptoms well controlled on baclofen/AcipHex twice daily.  AcipHex 20 mg twice daily, baclofen 2.5 mg (half a tablet) twice daily.  Continue current regimen.  Follow-up in 1 year or call sooner if needed.

## 2018-08-06 NOTE — Patient Instructions (Addendum)
1. RX for Baclofen sent to your pharmacy.  2. You have plenty of refills of Aciphex. 3. I will track down a copy of your last colonoscopy report and let you know when you are due again. 4. Return to the office in one year or call sooner if needed.

## 2018-08-09 NOTE — Progress Notes (Signed)
cc'ed to pcp °

## 2018-08-17 ENCOUNTER — Other Ambulatory Visit: Payer: Self-pay

## 2018-08-17 ENCOUNTER — Ambulatory Visit: Payer: Medicare Other | Admitting: Adult Health

## 2018-08-17 ENCOUNTER — Encounter: Payer: Self-pay | Admitting: Adult Health

## 2018-08-17 VITALS — BP 137/83 | HR 84 | Ht 67.5 in | Wt 154.5 lb

## 2018-08-17 DIAGNOSIS — M8580 Other specified disorders of bone density and structure, unspecified site: Secondary | ICD-10-CM | POA: Diagnosis not present

## 2018-08-17 NOTE — Progress Notes (Addendum)
Patient ID: Angie Jennings, female   DOB: 05-15-1950, 68 y.o.   MRN: [id number] History of Present Illness: Angie Jennings is a 68 year old white female, married sp hysterectomy and sp bilateral mastectomy with bilateral implants, in for visit to get DEXA scheduled, had osteopenia 2 years ago.She had well woman gyn exam 05/28/17.  PCP is Dr Willey Blade.    Current Medications, Allergies, Past Medical History, Past Surgical History, Family History and Social History were reviewed in [employer] record.     Review of Systems: Patient denies any headaches, hearing loss, fatigue, blurred vision, shortness of breath, chest pain, abdominal pain, problems with bowel movements, urination, or intercourse. No joint pain or mood swings.    Physical Exam:BP 137/83 (BP Location: Right Arm, Patient Position: Sitting, Cuff Size: Normal)   Pulse 84   Ht 5' 7.5" (1.715 m)   Wt 154 lb 8 oz (70.1 kg)   BMI 23.84 kg/m  General:  Well developed, well nourished, no acute distress Skin:  Warm and dry Neck:  Midline trachea, normal thyroid, good ROM, no lymphadenopathy, no carotid bruits heard  Lungs; Clear to auscultation bilaterally Cardiovascular: Regular rate and rhythm Pelvic: Deferred Rectal: deferred sent 3 cards home to do Extremities/musculoskeletal:  No swelling or varicosities noted, no clubbing or cyanosis Psych:  No mood changes, alert and cooperative,seems happy Fall risk is low. Pt had labs 6/15 and we discussed those by phone and she had Effexor and Zocor refilled then   Impression: 1. Osteopenia, unspecified location       Plan: DEXA 08/25/2018 at 10:30 am at [facility] She does not get mammogram, may get MR next year 3 hemoccult cards home Labs in 1 year Follow up with PCP for new shingles shot Physical in 1 year

## 2018-08-25 ENCOUNTER — Telehealth: Payer: Self-pay | Admitting: Adult Health

## 2018-08-25 ENCOUNTER — Other Ambulatory Visit: Payer: Self-pay

## 2018-08-25 ENCOUNTER — Ambulatory Visit (HOSPITAL_COMMUNITY)
Admission: RE | Admit: 2018-08-25 | Discharge: 2018-08-25 | Disposition: A | Discharge status: Home / Self Care | Payer: Medicare Other | Source: Ambulatory Visit | Attending: Adult Health | Admitting: Adult Health

## 2018-08-25 DIAGNOSIS — M85832 Other specified disorders of bone density and structure, left forearm: Secondary | ICD-10-CM | POA: Insufficient documentation

## 2018-08-25 DIAGNOSIS — Z780 Asymptomatic menopausal state: Secondary | ICD-10-CM | POA: Diagnosis not present

## 2018-08-25 DIAGNOSIS — M8580 Other specified disorders of bone density and structure, unspecified site: Secondary | ICD-10-CM | POA: Insufficient documentation

## 2018-08-25 NOTE — Telephone Encounter (Signed)
Pt aware that DEXA showed osteopenia, will repeat in  2 years, continue taking calcium,vitamin D and being active.She is aware of FRAX too.

## 2018-12-06 ENCOUNTER — Inpatient Hospital Stay (HOSPITAL_COMMUNITY): Payer: Medicare Other | Attending: Hematology

## 2018-12-06 DIAGNOSIS — K219 Gastro-esophageal reflux disease without esophagitis: Secondary | ICD-10-CM | POA: Insufficient documentation

## 2018-12-06 DIAGNOSIS — E785 Hyperlipidemia, unspecified: Secondary | ICD-10-CM | POA: Diagnosis not present

## 2018-12-06 DIAGNOSIS — M8580 Other specified disorders of bone density and structure, unspecified site: Secondary | ICD-10-CM | POA: Diagnosis not present

## 2018-12-06 DIAGNOSIS — Z171 Estrogen receptor negative status [ER-]: Secondary | ICD-10-CM

## 2018-12-06 DIAGNOSIS — D0512 Intraductal carcinoma in situ of left breast: Secondary | ICD-10-CM | POA: Diagnosis present

## 2018-12-06 DIAGNOSIS — Z23 Encounter for immunization: Secondary | ICD-10-CM | POA: Diagnosis present

## 2018-12-06 DIAGNOSIS — F419 Anxiety disorder, unspecified: Secondary | ICD-10-CM | POA: Insufficient documentation

## 2018-12-06 DIAGNOSIS — C50912 Malignant neoplasm of unspecified site of left female breast: Secondary | ICD-10-CM

## 2018-12-06 LAB — VITAMIN D 25 HYDROXY (VIT D DEFICIENCY, FRACTURES): Vit D, 25-Hydroxy: 34.22 ng/mL (ref 30–100)

## 2018-12-06 LAB — CBC WITH DIFFERENTIAL/PLATELET
Abs Immature Granulocytes: 0.01 10*3/uL (ref 0.00–0.07)
Basophils Absolute: 0 10*3/uL (ref 0.0–0.1)
Basophils Relative: 0 %
Eosinophils Absolute: 0.2 10*3/uL (ref 0.0–0.5)
Eosinophils Relative: 3 %
HCT: 45.2 % (ref 36.0–46.0)
Hemoglobin: 14.6 g/dL (ref 12.0–15.0)
Immature Granulocytes: 0 %
Lymphocytes Relative: 29 %
Lymphs Abs: 2.2 10*3/uL (ref 0.7–4.0)
MCH: 29.6 pg (ref 26.0–34.0)
MCHC: 32.3 g/dL (ref 30.0–36.0)
MCV: 91.5 fL (ref 80.0–100.0)
Monocytes Absolute: 0.7 10*3/uL (ref 0.1–1.0)
Monocytes Relative: 9 %
Neutro Abs: 4.4 10*3/uL (ref 1.7–7.7)
Neutrophils Relative %: 59 %
Platelets: 254 10*3/uL (ref 150–400)
RBC: 4.94 MIL/uL (ref 3.87–5.11)
RDW: 13.3 % (ref 11.5–15.5)
WBC: 7.5 10*3/uL (ref 4.0–10.5)
nRBC: 0 % (ref 0.0–0.2)

## 2018-12-06 LAB — COMPREHENSIVE METABOLIC PANEL
ALT: 19 U/L (ref 0–44)
AST: 19 U/L (ref 15–41)
Albumin: 3.8 g/dL (ref 3.5–5.0)
Alkaline Phosphatase: 101 U/L (ref 38–126)
Anion gap: 9 (ref 5–15)
BUN: 16 mg/dL (ref 8–23)
CO2: 26 mmol/L (ref 22–32)
Calcium: 9.3 mg/dL (ref 8.9–10.3)
Chloride: 105 mmol/L (ref 98–111)
Creatinine, Ser: 0.9 mg/dL (ref 0.44–1.00)
GFR calc Af Amer: >60 mL/min (ref >60)
GFR calc non Af Amer: >60 mL/min (ref >60)
Glucose, Bld: 101 mg/dL — ABNORMAL HIGH (ref 70–99)
Potassium: 4.4 mmol/L (ref 3.5–5.1)
Sodium: 140 mmol/L (ref 135–145)
Total Bilirubin: 0.4 mg/dL (ref 0.3–1.2)
Total Protein: 6.3 g/dL — ABNORMAL LOW (ref 6.5–8.1)

## 2018-12-13 ENCOUNTER — Encounter (HOSPITAL_COMMUNITY): Payer: Self-pay | Admitting: Hematology

## 2018-12-13 ENCOUNTER — Inpatient Hospital Stay (HOSPITAL_COMMUNITY): Payer: Medicare Other | Admitting: Hematology

## 2018-12-13 ENCOUNTER — Other Ambulatory Visit: Payer: Self-pay

## 2018-12-13 VITALS — BP 143/60 | HR 71 | Temp 97.8°F | Resp 14 | Wt 153.9 lb

## 2018-12-13 DIAGNOSIS — Z171 Estrogen receptor negative status [ER-]: Secondary | ICD-10-CM | POA: Diagnosis not present

## 2018-12-13 DIAGNOSIS — D0512 Intraductal carcinoma in situ of left breast: Secondary | ICD-10-CM

## 2018-12-13 DIAGNOSIS — C50912 Malignant neoplasm of unspecified site of left female breast: Secondary | ICD-10-CM

## 2018-12-13 MED ORDER — INFLUENZA VAC A&B SA ADJ QUAD 0.5 ML IM PRSY
0.5000 mL | PREFILLED_SYRINGE | Freq: Once | INTRAMUSCULAR | Status: AC
  Administered 2018-12-13: 0.5 mL via INTRAMUSCULAR
  Filled 2018-12-13: qty 0.5

## 2018-12-13 NOTE — Patient Instructions (Addendum)
[facility] at [facility] Discharge Instructions  You were seen today by Dr. Katragadda. He went over your recent test results. He will see you back in 1 year for labs and follow up.   Thank you for choosing [facility] at [facility] to provide your oncology and hematology care.  To afford each patient quality time with our provider, please arrive at least 15 minutes before your scheduled appointment time.   If you have a lab appointment with the Cancer Center please come in thru the  Main Entrance and check in at the main information desk  You need to re-schedule your appointment should you arrive 10 or more minutes late.  We strive to give you quality time with our providers, and arriving late affects you and other patients whose appointments are after yours.  Also, if you no show three or more times for appointments you may be dismissed from the clinic at the providers discretion.     Again, thank you for choosing [facility].  Our hope is that these requests will decrease the amount of time that you wait before being seen by our physicians.       _____________________________________________________________  Should you have questions after your visit to [facility], please contact our office at [phone] between the hours of 8:00 a.m. and 4:30 p.m.  Voicemails left after 4:00 p.m. will not be returned until the following business day.  For prescription refill requests, have your pharmacy contact our office and allow 72 hours.    Cancer Center Support Programs:   > Cancer Support Group  2nd Tuesday of the month 1pm-2pm, Journey Room    

## 2018-12-13 NOTE — Progress Notes (Signed)
[place], [place] [address]   CLINIC:  Medical Oncology/Hematology  PCP:  [name], [address] [place] [address] [phone]   REASON FOR VISIT:  Follow-up for left breast DCIS and osteopenia.  CURRENT THERAPY: Close observation.  BRIEF ONCOLOGIC HISTORY:  Oncology History  Breast cancer ([place])  09/27/2014 Mammogram   Left breast: new area of calcifications   10/05/2014 Breast US   Left breast: developing grouped linear calcifications following a ductal pattern spanning an area of 2.7 cm.   10/05/2014 Initial Biopsy   Left breast core needle bx: high grade DCIS, ER- (0%), PR- (0%)   10/12/2014 Breast MRI   Left breast: nonmass enhancement in LOQ greater than that visualized on mammography; contiguous enhancement posterior and medial to the biopsied calcifications which does not have a mammographic correlate.   10/12/2014 Clinical Stage   Stage 0: Tis N0   10/16/2014 Procedure   Genetic testing: Breast/Ovarian panel ([facility]) revealed no clinically significant variant at ATM, BARD1, BRCA1, BRCA2, BRIP1, CDH1, CHEK2, EPCAM, FANCC, MLH1, MSH2, MSH6, NBN, PALB2, PMS2, PTEN, RAD51C, RAD51D, TP53, and XRCC2.   11/01/2014 Definitive Surgery   Bilateral mastectomy / SLNB with immediate reconstruction.  RIGHT: negative for malignancy.  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 1 LN removed and negative (0/1)   11/01/2014 Pathologic Stage   Stage 0: Tis N0   11/30/2014 Survivorship   Survivorship visit completed and copy of care plan provided to patient   03/13/2015 Procedure   1. Removal bilateral tissue expanders and placement silicone implants 2. Lipofilling to bilateral chest  SURGEON: Irene Limbo MD MBA      CANCER STAGING: Cancer Staging Breast cancer [facility]) Staging form: Breast, AJCC 7th Edition - Clinical stage from 10/12/2014: Stage 0 (Tis (DCIS), N0, M0) - Signed by  Baird Cancer, PA-C on 12/12/2015 - Pathologic stage from 11/01/2014: Stage 0 (Tis (DCIS), N0, cM0) - Signed by Baird Cancer, PA-C on 12/12/2015    INTERVAL HISTORY:  Ms. Gheen 68 y.o. female seen for follow-up of left breast DCIS and osteopenia.  Appetite is 100%.  Energy levels are 75%.  She follows up with Dr. Iran Planas.  She denies any new onset pains.  She is continuing to take calcium and vitamin D supplements.  Denies any fevers, night sweats or weight loss in the last 6 months.  Denies any nausea, vomiting or dyspepsia.    REVIEW OF SYSTEMS:  Review of Systems  All other systems reviewed and are negative.    PAST MEDICAL/SURGICAL HISTORY:  Past Medical History:  Diagnosis Date  . Anxiety   . Arthritis    OA  . Breast cancer ([place]) 09/2014   ER-/PR- DCIS  . Breast disorder   . Childhood asthma   . Elevated cholesterol with elevated triglycerides 10/05/2017   Add zocor  . GERD (gastroesophageal reflux disease)   . Radiation    as child for treatment of keloids on face and neck   Past Surgical History:  Procedure Laterality Date  . ABDOMINAL HYSTERECTOMY  2006  . back surgery  1995   fusion of L4 & L5  . bravo capsule endoscopy  05/02/09   GERD with adequate acid suppression  . BRAVO [place] STUDY  11/21/2011   Procedure: BRAVO [place];  Surgeon: Danie Binder, MD;  [facility];  Service: Endoscopy;  Laterality: N/A;  . BREAST BIOPSY  10/05/14  .  BREAST RECONSTRUCTION WITH PLACEMENT OF TISSUE EXPANDER AND FLEX HD (ACELLULAR HYDRATED DERMIS) Bilateral 11/01/2014   Procedure: BILATERAL BREAST RECONSTRUCTION WITH PLACEMENT OF TISSUE EXPANDER AND FLEX HD (ACELLULAR HYDRATED DERMIS);  Surgeon: Irene Limbo, MD;  Location: [facility];  Service: Plastics;  Laterality: Bilateral;  . BREAST SURGERY  AUGUST 2015   BREAST BIOPSY   . CATARACT EXTRACTION W/PHACO Right 07/23/2015   Procedure: CATARACT EXTRACTION PHACO AND INTRAOCULAR LENS PLACEMENT  RIGHT EYE CDE=1.72;  Surgeon: Williams Che, MD;  Location: [facility];  Service: Ophthalmology;  Laterality: Right;  . CATARACT EXTRACTION W/PHACO Left 11/05/2015   Procedure: CATARACT EXTRACTION PHACO AND INTRAOCULAR LENS PLACEMENT; CDE:  3.17;  Surgeon: Williams Che, MD;  Location: [facility];  Service: Ophthalmology;  Laterality: Left;  . COLONOSCOPY  07/2009   Dr. Oneida Alar: rare diverticulae, hemorrhoids. next TCS 07/2019 with pediatric colonoscope.  . ESOPHAGOGASTRODUODENOSCOPY  04/20/09   mild gastritis  . ESOPHAGOGASTRODUODENOSCOPY  07/27/09   small internal hemorrhoids/rare sigmoid colon diverticula other wise no polyps  . ESOPHAGOGASTRODUODENOSCOPY  11/21/2011   SLF: 1. the mucosa of the esophagus appeared normal 2. NOn-erosive gastritis (inflammation) was found multiple biopsies 3. the duodenal mucosa showed no abnormalitis.   [name] Kitchen ESOPHAGOGASTRODUODENOSCOPY N/A 06/22/2017   Procedure: ESOPHAGOGASTRODUODENOSCOPY (EGD);  Surgeon: Danie Binder, MD;  [facility];  Service: Endoscopy;  Laterality: N/A;  1:00pm  . LIPOSUCTION WITH LIPOFILLING Bilateral 03/13/2015   Procedure: LIPO FILLING TO BILATERAL BREAST FROM ABDOMEN;  Surgeon: Irene Limbo, MD;  Location: [facility];  Service: Plastics;  Laterality: Bilateral;  . NIPPLE SPARING MASTECTOMY/SENTINAL LYMPH NODE BIOPSY/RECONSTRUCTION/PLACEMENT OF TISSUE EXPANDER Bilateral 11/01/2014   Procedure: LEFT NIPPLE SPARING MASTECTOMY WITH LEFT  SENTINAL LYMPH NODE BIOPSY AND RIGHT PROPYLACTIC NIPPLE SPARING MASTECTOMY;  Surgeon: Rolm Bookbinder, MD;  Location: [facility];  Service: General;  Laterality: Bilateral;  . REMOVAL OF BILATERAL TISSUE EXPANDERS WITH PLACEMENT OF BILATERAL BREAST IMPLANTS Bilateral 03/13/2015   Procedure: REMOVAL OF BILATERAL TISSUE EXPANDERS WITH PLACEMENT OF BILATERAL BREAST IMPLANTS;  Surgeon: Irene Limbo, MD;  Location: [facility];  Service: Plastics;  Laterality:  Bilateral;     SOCIAL HISTORY:  Social History   Socioeconomic History  . Marital status: Married    Spouse name: Not on file  . Number of children: 3  . Years of education: Not on file  . Highest education level: Not on file  Occupational History  . Not on file  Social Needs  . Financial resource strain: Not on file  . Food insecurity    Worry: Not on file    Inability: Not on file  . Transportation needs    Medical: Not on file    Non-medical: Not on file  Tobacco Use  . Smoking status: Never Smoker  . Smokeless tobacco: Never Used  Substance and Sexual Activity  . Alcohol use: Yes    Alcohol/week: 0.0 standard drinks    Comment: rare  . Drug use: No  . Sexual activity: Yes    Birth control/protection: Surgical    Comment: hyst  Lifestyle  . Physical activity    Days per week: Not on file    Minutes per session: Not on file  . Stress: Not on file  Relationships  . Social Herbalist on phone: Not on file    Gets together: Not on file    Attends religious service: Not on file    Active member of club  or organization: Not on file    Attends meetings of clubs or organizations: Not on file    Relationship status: Not on file  . Intimate partner violence    Fear of current or ex partner: Not on file    Emotionally abused: Not on file    Physically abused: Not on file    Forced sexual activity: Not on file  Other Topics Concern  . Not on file  Social History Narrative  . Not on file    FAMILY HISTORY:  Family History  Problem Relation Age of Onset  . Dementia Mother   . Esophageal cancer Mother 75  . Hypertension Father   . Lung cancer Father 35       smoker  . Prostate cancer Brother 21  . Dementia Maternal Aunt   . Bladder Cancer Maternal Uncle 62       former smoker  . Heart attack Maternal Grandmother   . Dementia Maternal Grandfather   . Esophageal cancer Maternal Aunt 72       non-smoker  . Brain cancer Maternal Uncle 63        Meningioma - benign  . Esophageal cancer Maternal Uncle 61  . Colon cancer Neg Hx   . Colon polyps Neg Hx     CURRENT MEDICATIONS:  Outpatient Encounter Medications as of 12/13/2018  Medication Sig  . acetaminophen (TYLENOL) 500 MG tablet Take 500 mg by mouth every 6 (six) hours as needed. Reported on 05/09/2015  . albuterol (PROVENTIL HFA;VENTOLIN HFA) 108 (90 Base) MCG/ACT inhaler Inhale 1-2 puffs into the lungs every 4 (four) hours as needed for wheezing or shortness of breath.  . baclofen (LIORESAL) 10 MG tablet SMARTSIG:0.25 Tablet(s) By Mouth Twice Daily  . Calcium Citrate-Vitamin D (CITRACAL PETITES/VITAMIN D) 200-250 MG-UNIT TABS Take 2 tablets by mouth daily.  . Multiple Vitamins-Minerals (MULTIVITAMIN WITH MINERALS) tablet Take 1 tablet by mouth daily as needed.   . RABEprazole (ACIPHEX) 20 MG tablet Take 1 tablet (20 mg total) by mouth 2 (two) times daily before a meal.  . simvastatin (ZOCOR) 20 MG tablet Take 1 daily  . venlafaxine (EFFEXOR) 37.5 MG tablet Take 1 tablet (37.5 mg total) by mouth daily.  . [EXPIRED] influenza vaccine adjuvanted (FLUAD) injection 0.5 mL    No facility-administered encounter medications on file as of 12/13/2018.     ALLERGIES:  Allergies  Allergen Reactions  . Nsaids Other (See Comments)    *Kidney failure  . Aspirin Other (See Comments)    Pt. states ASA makes her bleed.   . Ioxaglate Hives  . Ivp Dye [Iodinated Diagnostic Agents] Hives  . Kapidex [Dexlansoprazole] Diarrhea  . Lidocaine Nausea Only    Feels hot and flushing  . Metronidazole   . Clarithromycin Rash     PHYSICAL EXAM:  ECOG Performance status: 1  Vitals:   12/13/18 0809  BP: (!) 143/60  Pulse: 71  Resp: 14  Temp: 97.8 F (36.6 C)  SpO2: 98%   Filed Weights   12/13/18 0809  Weight: 153 lb 14.4 oz (69.8 kg)    Physical Exam Vitals signs reviewed.  Constitutional:      Appearance: Normal appearance.  Cardiovascular:     Rate and Rhythm: Normal rate and  regular rhythm.     Heart sounds: Normal heart sounds.  Pulmonary:     Effort: Pulmonary effort is normal.     Breath sounds: Normal breath sounds.  Abdominal:     General: There is no  distension.     Palpations: Abdomen is soft. There is no mass.  Lymphadenopathy:     Cervical: No cervical adenopathy.  Skin:    General: Skin is warm.  Neurological:     General: No focal deficit present.     Mental Status: She is alert and oriented to person, place, and time.  Psychiatric:        Mood and Affect: Mood normal.        Behavior: Behavior normal.   Bilateral breast exam was within normal limits with implants unchanged.  No palpable masses.   LABORATORY DATA:  I have reviewed the labs as listed.  CBC    Component Value Date/Time   WBC 7.5 12/06/2018 1128   RBC 4.94 12/06/2018 1128   HGB 14.6 12/06/2018 1128   HGB 14.9 08/02/2018 0855   HCT 45.2 12/06/2018 1128   HCT 45.0 08/02/2018 0855   PLT 254 12/06/2018 1128   PLT 249 08/02/2018 0855   MCV 91.5 12/06/2018 1128   MCV 90 08/02/2018 0855   MCH 29.6 12/06/2018 1128   MCHC 32.3 12/06/2018 1128   RDW 13.3 12/06/2018 1128   RDW 13.4 08/02/2018 0855   LYMPHSABS 2.2 12/06/2018 1128   MONOABS 0.7 12/06/2018 1128   EOSABS 0.2 12/06/2018 1128   BASOSABS 0.0 12/06/2018 1128   CMP Latest Ref Rng & Units 12/06/2018 08/02/2018 10/02/2017  Glucose 70 - 99 mg/dL 101(H) 101(H) 99  BUN 8 - 23 mg/dL _0 Creatinine 0.44 - 1.00 mg/dL 0.90 0.85 0.80  Sodium 135 - 145 mmol/L 140 142 141  Potassium 3.5 - 5.1 mmol/L 4.4 4.7 4.6  Chloride 98 - 111 mmol/L 105 105 104  CO2 22 - 32 mmol/L _1 Calcium 8.9 - 10.3 mg/dL 9.3 9.6 9.4  Total Protein 6.5 - 8.1 g/dL 6.3(L) 6.3 6.3  Total Bilirubin 0.3 - 1.2 mg/dL 0.4 0.3 0.2  Alkaline Phos 38 - 126 U/L 101 102 124(H)  AST 15 - 41 U/L _2 ALT 0 - 44 U/L _3 DIAGNOSTIC IMAGING:  I have independently reviewed the scans and discussed with the patient.     ASSESSMENT & PLAN:   Breast cancer ([place]) 1.  Left breast high-grade DCIS: - Initial diagnosed by biopsy on 10/05/2014 showing high-grade DCIS, ER/PR negative - Bilateral nipple sparing mastectomy on 11/01/2014.  Hereditary mutation testing for breast cancer was reportedly negative. -She had textured breast implants and follows up with her plastic surgeon Dr. Iran Planas. -Today's physical examination did not reveal any suspicious areas. -We have reviewed her blood work which is grossly within normal limits.  She will come back in 1 year for follow-up.  2.  Osteopenia: -DEXA scan on 04/29/2016 shows osteopenia which has been stable. -We reviewed DEXA scan from 08/25/2018 which shows T score of -1.9.  Vitamin D level was 34.2 on 12/06/2018. She will continue calcium and vitamin D.  3.  GERD: -She underwent EGD on 06/22/2017 which shows normal esophagus, multiple sessile polyps in the cardia and fundus of the stomach which were biopsied and found to be benign.  She will have EGDs done every 5 years. - She has a mother who had esophageal cancer in her 33s, maternal aunt who had esophageal cancer in her 71s and maternal uncle who developed the same cancer in his 41s.  They were all smokers and drank alcohol.      Orders placed this  encounter:  Orders Placed This Encounter  Procedures  . CBC with Differential/Platelet  . Comprehensive metabolic panel  . Vitamin D 25 hydroxy      Derek Jack, MD [place] [phone]

## 2018-12-13 NOTE — Assessment & Plan Note (Addendum)
1.  Left breast high-grade DCIS: - Initial diagnosed by biopsy on 10/05/2014 showing high-grade DCIS, ER/PR negative - Bilateral nipple sparing mastectomy on 11/01/2014.  Hereditary mutation testing for breast cancer was reportedly negative. -She had textured breast implants and follows up with her plastic surgeon Dr. Iran Planas. -Today's physical examination did not reveal any suspicious areas. -We have reviewed her blood work which is grossly within normal limits.  She will come back in 1 year for follow-up.  2.  Osteopenia: -DEXA scan on 04/29/2016 shows osteopenia which has been stable. -We reviewed DEXA scan from 08/25/2018 which shows T score of -1.9.  Vitamin D level was 34.2 on 12/06/2018. She will continue calcium and vitamin D.  3.  GERD: -She underwent EGD on 06/22/2017 which shows normal esophagus, multiple sessile polyps in the cardia and fundus of the stomach which were biopsied and found to be benign.  She will have EGDs done every 5 years. - She has a mother who had esophageal cancer in her 12s, maternal aunt who had esophageal cancer in her 49s and maternal uncle who developed the same cancer in his 68s.  They were all smokers and drank alcohol.

## 2019-03-10 ENCOUNTER — Telehealth: Payer: Self-pay | Admitting: Adult Health

## 2019-03-10 MED ORDER — METHOCARBAMOL 500 MG PO TABS: 500.0000 mg | ORAL_TABLET | Freq: Four times a day (QID) | ORAL | 0 refills | Status: DC

## 2019-03-10 NOTE — Telephone Encounter (Signed)
Pt has twisted back and has pain, requests robaxin, will rx robaxin

## 2019-04-14 ENCOUNTER — Telehealth: Payer: Self-pay

## 2019-04-14 NOTE — Telephone Encounter (Signed)
PA for Aciphex approved in Cover My Meds.  I have not gotten a fax.  But the number was [phone].   I called [employer] and left the info on Vm.

## 2019-06-12 ENCOUNTER — Other Ambulatory Visit: Payer: Self-pay | Admitting: Gastroenterology

## 2019-06-16 ENCOUNTER — Telehealth: Payer: Self-pay | Admitting: Gastroenterology

## 2019-06-16 NOTE — Telephone Encounter (Signed)
Called pt verified name and dob pt stated s refill request from her pharmacy was sent in over the weekend. However, pharmacy has not received anything yet.

## 2019-06-16 NOTE — Telephone Encounter (Signed)
Rx sent 

## 2019-06-16 NOTE — Telephone Encounter (Signed)
Pt called to reschedule her OV from January. She is aware of appt with [facility] on 5/5. She asked if she could get a few aciphex called into [facility] to hold her until next week. Please advise. [phone]

## 2019-06-17 NOTE — Telephone Encounter (Signed)
Pt.notified

## 2019-06-22 ENCOUNTER — Other Ambulatory Visit: Payer: Self-pay | Admitting: *Deleted

## 2019-06-22 ENCOUNTER — Encounter: Payer: Self-pay | Admitting: *Deleted

## 2019-06-22 ENCOUNTER — Ambulatory Visit: Payer: Medicare PPO | Admitting: Gastroenterology

## 2019-06-22 ENCOUNTER — Encounter: Payer: Self-pay | Admitting: Gastroenterology

## 2019-06-22 ENCOUNTER — Telehealth: Payer: Self-pay

## 2019-06-22 ENCOUNTER — Other Ambulatory Visit: Payer: Self-pay

## 2019-06-22 DIAGNOSIS — K219 Gastro-esophageal reflux disease without esophagitis: Secondary | ICD-10-CM

## 2019-06-22 DIAGNOSIS — Z1211 Encounter for screening for malignant neoplasm of colon: Secondary | ICD-10-CM

## 2019-06-22 MED ORDER — RABEPRAZOLE SODIUM 20 MG PO TBEC: 20.0000 mg | DELAYED_RELEASE_TABLET | Freq: Two times a day (BID) | ORAL | 3 refills | Status: DC

## 2019-06-22 MED ORDER — CLENPIQ 10-3.5-12 MG-GM -GM/160ML PO SOLN: 1.0000 | Freq: Once | ORAL | 0 refills | Status: AC

## 2019-06-22 MED ORDER — SUPREP BOWEL PREP KIT 17.5-3.13-1.6 GM/177ML PO SOLN
1.0000 | ORAL | 0 refills | Status: DC
Start: 2019-06-22 — End: 2022-09-02

## 2019-06-22 NOTE — Assessment & Plan Note (Addendum)
SYMPTOMS CONTROLLED WHEN TAKING ACIPHEX BID.  DRINK WATER TO KEEP YOUR URINE LIGHT YELLOW. AVOID REFLUX TRIGGERS.  HANDOUT GIVEN. CONTINUE ACIPHEX BID. STOP BACLOFEN. FOLLOW UP IN 1 YEAR WITH DR. Gala Romney.

## 2019-06-22 NOTE — Telephone Encounter (Signed)
Received fax from [facility], [place] not covered by insurance and price is $128. Requested to change to Suprep for $40. Suprep rx sent to pharmacy. New instructions mailed.  Tried to call pt, no answer, LMOVM to inform pt.

## 2019-06-22 NOTE — Progress Notes (Signed)
Subjective:    Patient ID: Angie [facility], female    DOB: 1950-04-08, 69 y.o.   MRN: [id number]  Angie Noble, MD   HPI NEEDS A COLONOSCOPY THIS YEAR. HEARTBURN: GOOD IF TAKES HER MEDICINE. [facility] CHANGED. BACLOFEN 5 MG BID MADE HER DIZZY. CAN'T TELL 5 MG WORKS WHEN TAKING QD OR BID. HEARTBURN: 1-2X/WEEK IF SHE MISSES A PILL THE NEXT DAY SHE PAYS FOR IT. ACIPHEX BID CONTROLS HEARTBURN. CAN'T DRINK ETOH AFTER 7 PM. PREFERS CIDER OR WINE. SEDATION OK FOR EGD. BMs: MOST OF THE TIME BSC #4, IF HAS DAIRY BSC #5. OCCASIONAL FECAL INCONTINENCE WITH LOOSE STOOL: "LEAKAGE"-~1X/MO. CAN'T TELL KEGEL EXERCISES DON'T HELP.  HAD 4TH DEGREE TEAR WITH FIRST CHILD. 3 VAGINAL BIRTHS.    PT DENIES FEVER, CHILLS, HEMATOCHEZIA, HEMATEMESIS, nausea, vomiting, melena, CHEST PAIN, SHORTNESS OF BREATH, CHANGE IN BOWEL IN HABITS, constipation, abdominal pain, problems swallowing, OR  problems with sedation.  Past Medical History:  Diagnosis Date  . Anxiety   . Arthritis    OA  . Breast cancer ([place]) 09/2014   ER-/PR- DCIS  . Breast disorder   . Childhood asthma   . Elevated cholesterol with elevated triglycerides 10/05/2017   Add zocor  . GERD (gastroesophageal reflux disease)   . Radiation    as child for treatment of keloids on face and neck    Past Surgical History:  Procedure Laterality Date  . ABDOMINAL HYSTERECTOMY  2006  . back surgery  1995   fusion of L4 & L5  . bravo capsule endoscopy  05/02/09   GERD with adequate acid suppression  . BRAVO [place] STUDY  11/21/2011   Procedure: BRAVO [place];  Surgeon: Danie Binder, MD;  [facility];  Service: Endoscopy;  Laterality: N/A;  . BREAST BIOPSY  10/05/14  . BREAST RECONSTRUCTION WITH PLACEMENT OF TISSUE EXPANDER AND FLEX HD (ACELLULAR HYDRATED DERMIS) Bilateral 11/01/2014   Procedure: BILATERAL BREAST RECONSTRUCTION WITH PLACEMENT OF TISSUE EXPANDER AND FLEX HD (ACELLULAR HYDRATED DERMIS);  Surgeon: Irene Limbo, MD;  Location:  [facility];  Service: Plastics;  Laterality: Bilateral;  . BREAST SURGERY  AUGUST 2015   BREAST BIOPSY   . CATARACT EXTRACTION W/PHACO Right 07/23/2015   Procedure: CATARACT EXTRACTION PHACO AND INTRAOCULAR LENS PLACEMENT RIGHT EYE CDE=1.72;  Surgeon: Williams Che, MD;  Location: [facility];  Service: Ophthalmology;  Laterality: Right;  . CATARACT EXTRACTION W/PHACO Left 11/05/2015   Procedure: CATARACT EXTRACTION PHACO AND INTRAOCULAR LENS PLACEMENT; CDE:  3.17;  Surgeon: Williams Che, MD;  Location: [facility];  Service: Ophthalmology;  Laterality: Left;  . COLONOSCOPY  07/2009   Dr. Oneida Alar: rare diverticulae, hemorrhoids. next TCS 07/2019 with pediatric colonoscope.  . ESOPHAGOGASTRODUODENOSCOPY  04/20/09   mild gastritis  . ESOPHAGOGASTRODUODENOSCOPY  07/27/09   small internal hemorrhoids/rare sigmoid colon diverticula other wise no polyps  . ESOPHAGOGASTRODUODENOSCOPY  11/21/2011   SLF: 1. the mucosa of the esophagus appeared normal 2. NOn-erosive gastritis (inflammation) was found multiple biopsies 3. the duodenal mucosa showed no abnormalitis.   [name] Kitchen ESOPHAGOGASTRODUODENOSCOPY N/A 06/22/2017   Procedure: ESOPHAGOGASTRODUODENOSCOPY (EGD);  Surgeon: Danie Binder, MD;  [facility];  Service: Endoscopy;  Laterality: N/A;  1:00pm  . LIPOSUCTION WITH LIPOFILLING Bilateral 03/13/2015   Procedure: LIPO FILLING TO BILATERAL BREAST FROM ABDOMEN;  Surgeon: Irene Limbo, MD;  Location: [facility];  Service: Plastics;  Laterality: Bilateral;  . NIPPLE SPARING MASTECTOMY/SENTINAL LYMPH NODE BIOPSY/RECONSTRUCTION/PLACEMENT OF TISSUE EXPANDER Bilateral  11/01/2014   Procedure: LEFT NIPPLE SPARING MASTECTOMY WITH LEFT  SENTINAL LYMPH NODE BIOPSY AND RIGHT PROPYLACTIC NIPPLE SPARING MASTECTOMY;  Surgeon: Rolm Bookbinder, MD;  Location: [facility];  Service: General;  Laterality: Bilateral;  . REMOVAL OF BILATERAL TISSUE EXPANDERS WITH PLACEMENT OF BILATERAL  BREAST IMPLANTS Bilateral 03/13/2015   Procedure: REMOVAL OF BILATERAL TISSUE EXPANDERS WITH PLACEMENT OF BILATERAL BREAST IMPLANTS;  Surgeon: Irene Limbo, MD;  Location: [facility];  Service: Plastics;  Laterality: Bilateral;   Allergies  Allergen Reactions  . Nsaids Other (See Comments)    *Kidney failure  . Aspirin Other (See Comments)    Pt. states ASA makes her bleed.   . Ioxaglate Hives  . Ivp Dye [Iodinated Diagnostic Agents] Hives  . Kapidex [Dexlansoprazole] Diarrhea  . Lidocaine Nausea Only    Feels hot and flushing  . Metronidazole   . Clarithromycin Rash   Current Outpatient Medications  Medication Sig    . acetaminophen (TYLENOL) 500 MG tablet Take 500 mg by mouth every 6 (six) hours as needed. Reported on 05/09/2015    . albuterol (PROVENTIL HFA;VENTOLIN HFA) 108 (90 Base) MCG/ACT inhaler Inhale 1-2 puffs into the lungs every 4 (four) hours as needed for wheezing or shortness of breath.    .      . Calcium Citrate-Vitamin D (CITRACAL PETITES/VITAMIN D) 200-250 MG-UNIT TABS Take 2 tablets by mouth daily.    . methocarbamol (ROBAXIN) 500 MG tablet Take 500 mg by mouth as needed.     . Multiple Vitamins-Minerals (MULTIVITAMIN WITH MINERALS) tablet Take 1 tablet by mouth daily as needed.     . RABEprazole (ACIPHEX) 20 MG tablet TAKE 1 TABLET BY MOUTH TWICE DAILY.    . simvastatin (ZOCOR) 20 MG tablet Take 1 daily    . venlafaxine (EFFEXOR) 37.5 MG tablet Take 1 tablet (37.5 mg total) by mouth daily. TO PREVENT HOT FLASHES     Review of Systems PER HPI OTHERWISE ALL SYSTEMS ARE NEGATIVE.    Objective:   Physical Exam Constitutional:      General: She is not in acute distress.    Appearance: Normal appearance.  HENT:     Mouth/Throat:     Comments: MASK IN PLACE Eyes:     General: No scleral icterus.    Pupils: Pupils are equal, round, and reactive to light.  Cardiovascular:     Rate and Rhythm: Normal rate and regular rhythm.     Pulses: Normal  pulses.     Heart sounds: Normal heart sounds.  Pulmonary:     Effort: Pulmonary effort is normal.     Breath sounds: Normal breath sounds.  Abdominal:     General: Bowel sounds are normal.     Palpations: Abdomen is soft.     Tenderness: There is no abdominal tenderness.  Musculoskeletal:     Cervical back: Normal range of motion.     Right lower leg: No edema.     Left lower leg: No edema.  Lymphadenopathy:     Cervical: No cervical adenopathy.  Skin:    General: Skin is warm and dry.  Neurological:     Mental Status: She is alert and oriented to person, place, and time.     Comments: NO  NEW FOCAL DEFICITS  Psychiatric:        Mood and Affect: Mood normal.     Comments: NORMAL AFFECT       Assessment & Plan:

## 2019-06-22 NOTE — Patient Instructions (Addendum)
DRINK WATER TO KEEP YOUR URINE LIGHT YELLOW.  AVOID REFLUX TRIGGERS. SEE INFO BELOW.  CONTINUE ACIPHEX TWICE DAILY.  STOP BACLOFEN.  COMPLETE COLONOSCOPY FOR COLON CANCER SCREENING.  FOLLOW UP IN 1 YEAR WITH DR. Gala Romney.   Lifestyle and home remedies TO CONTROL HEARTBURN You may eliminate or reduce the frequency of heartburn by making the following lifestyle changes:  . Control your weight. Being overweight is a major risk factor for heartburn and GERD. Excess pounds put pressure on your abdomen, pushing up your stomach and causing acid to back up into your esophagus.   . Eat smaller meals. 4 TO 6 MEALS A DAY. This reduces pressure on the lower esophageal sphincter, helping to prevent the valve from opening and acid from washing back into your esophagus.   [name] your belt. Clothes that fit tightly around your waist put pressure on your abdomen and the lower esophageal sphincter.   . Eliminate heartburn triggers. Everyone has specific triggers. Common triggers such as fatty or fried foods, spicy food, tomato sauce, carbonated beverages, alcohol, chocolate, mint, garlic, onion, caffeine and nicotine may make heartburn worse.   [name] Kitchen Avoid stooping or bending. Tying your shoes is OK. Bending over for longer periods to weed your garden isn't, especially soon after eating.   . Don't lie down after a meal. Wait at least three to four hours after eating before going to bed, and don't lie down right after eating.   Alternative medicine . Several home remedies exist for treating GERD, but they provide only temporary relief. They include drinking baking soda (sodium bicarbonate) added to water or drinking other fluids such as baking soda mixed with cream of tartar and water. . Although these liquids create temporary relief by neutralizing, washing away or buffering acids, eventually they aggravate the situation by adding gas and fluid to your stomach, increasing pressure and causing more acid reflux.  Further, adding more sodium to your diet may increase your blood pressure and add stress to your heart, and excessive bicarbonate ingestion can alter the acid-base balance in your body.

## 2019-06-22 NOTE — Assessment & Plan Note (Signed)
NO WARNING SIGNS/SYMPTOMS  COMPLETE COLONOSCOPY FOR COLON CANCER SCREENING. DISCUSSED PROCEDURE, BENEFITS, & RISKS: < 1% chance of medication reaction, bleeding, perforation, ASPIRATION, or rupture of spleen/liver requiring surgery to fix it and missed polyps < 1 cm 10-20% of the time. FOLLOW UP IN 1 YEAR WITH DR. Gala Romney.

## 2019-07-19 ENCOUNTER — Other Ambulatory Visit: Payer: Self-pay | Admitting: Adult Health

## 2019-08-09 ENCOUNTER — Other Ambulatory Visit: Payer: Self-pay

## 2019-08-09 ENCOUNTER — Other Ambulatory Visit (HOSPITAL_COMMUNITY)
Admission: RE | Admit: 2019-08-09 | Discharge: 2019-08-09 | Disposition: A | Discharge status: Home / Self Care | Payer: Medicare PPO | Source: Ambulatory Visit | Attending: Internal Medicine | Admitting: Internal Medicine

## 2019-08-09 DIAGNOSIS — Z01812 Encounter for preprocedural laboratory examination: Secondary | ICD-10-CM | POA: Diagnosis present

## 2019-08-09 DIAGNOSIS — Z20822 Contact with and (suspected) exposure to covid-19: Secondary | ICD-10-CM | POA: Insufficient documentation

## 2019-08-09 LAB — SARS CORONAVIRUS 2 (TAT 6-24 HRS): SARS Coronavirus 2: NEGATIVE

## 2019-08-10 ENCOUNTER — Ambulatory Visit (HOSPITAL_COMMUNITY)
Admission: RE | Admit: 2019-08-10 | Discharge: 2019-08-10 | Disposition: A | Discharge status: Home / Self Care | Payer: Medicare PPO | Attending: Internal Medicine | Admitting: Internal Medicine

## 2019-08-10 ENCOUNTER — Encounter (HOSPITAL_COMMUNITY): Payer: Self-pay | Admitting: Internal Medicine

## 2019-08-10 ENCOUNTER — Encounter (HOSPITAL_COMMUNITY)
Admission: RE | Disposition: A | Discharge status: Home / Self Care | Payer: Self-pay | Source: Home / Self Care | Attending: Internal Medicine

## 2019-08-10 ENCOUNTER — Other Ambulatory Visit: Payer: Self-pay

## 2019-08-10 DIAGNOSIS — Z888 Allergy status to other drugs, medicaments and biological substances status: Secondary | ICD-10-CM | POA: Diagnosis not present

## 2019-08-10 DIAGNOSIS — Z8719 Personal history of other diseases of the digestive system: Secondary | ICD-10-CM | POA: Diagnosis not present

## 2019-08-10 DIAGNOSIS — K5730 Diverticulosis of large intestine without perforation or abscess without bleeding: Secondary | ICD-10-CM | POA: Diagnosis not present

## 2019-08-10 DIAGNOSIS — Z881 Allergy status to other antibiotic agents status: Secondary | ICD-10-CM | POA: Insufficient documentation

## 2019-08-10 DIAGNOSIS — Z884 Allergy status to anesthetic agent status: Secondary | ICD-10-CM | POA: Insufficient documentation

## 2019-08-10 DIAGNOSIS — Z9013 Acquired absence of bilateral breasts and nipples: Secondary | ICD-10-CM | POA: Diagnosis not present

## 2019-08-10 DIAGNOSIS — Z1211 Encounter for screening for malignant neoplasm of colon: Secondary | ICD-10-CM | POA: Diagnosis not present

## 2019-08-10 DIAGNOSIS — Z8249 Family history of ischemic heart disease and other diseases of the circulatory system: Secondary | ICD-10-CM | POA: Insufficient documentation

## 2019-08-10 DIAGNOSIS — D125 Benign neoplasm of sigmoid colon: Secondary | ICD-10-CM | POA: Diagnosis not present

## 2019-08-10 DIAGNOSIS — F419 Anxiety disorder, unspecified: Secondary | ICD-10-CM | POA: Insufficient documentation

## 2019-08-10 DIAGNOSIS — E7800 Pure hypercholesterolemia, unspecified: Secondary | ICD-10-CM | POA: Diagnosis not present

## 2019-08-10 DIAGNOSIS — K635 Polyp of colon: Secondary | ICD-10-CM | POA: Diagnosis not present

## 2019-08-10 DIAGNOSIS — K219 Gastro-esophageal reflux disease without esophagitis: Secondary | ICD-10-CM | POA: Insufficient documentation

## 2019-08-10 DIAGNOSIS — M1990 Unspecified osteoarthritis, unspecified site: Secondary | ICD-10-CM | POA: Diagnosis not present

## 2019-08-10 DIAGNOSIS — Z91041 Radiographic dye allergy status: Secondary | ICD-10-CM | POA: Diagnosis not present

## 2019-08-10 DIAGNOSIS — Z886 Allergy status to analgesic agent status: Secondary | ICD-10-CM | POA: Diagnosis not present

## 2019-08-10 HISTORY — PX: POLYPECTOMY: SHX5525

## 2019-08-10 HISTORY — PX: COLONOSCOPY: SHX5424

## 2019-08-10 SURGERY — COLONOSCOPY
Anesthesia: Moderate Sedation

## 2019-08-10 MED ORDER — ONDANSETRON HCL 4 MG/2ML IJ SOLN
INTRAMUSCULAR | Status: AC
  Filled 2019-08-10: qty 2

## 2019-08-10 MED ORDER — ONDANSETRON HCL 4 MG/2ML IJ SOLN
INTRAMUSCULAR | Status: DC | PRN
  Administered 2019-08-10: 4 mg via INTRAVENOUS

## 2019-08-10 MED ORDER — MEPERIDINE HCL 100 MG/ML IJ SOLN
INTRAMUSCULAR | Status: DC | PRN
  Administered 2019-08-10: 25 mg via INTRAVENOUS
  Administered 2019-08-10: 15 mg via INTRAVENOUS

## 2019-08-10 MED ORDER — STERILE WATER FOR IRRIGATION IR SOLN
Status: DC | PRN
  Administered 2019-08-10: 2.5 mL

## 2019-08-10 MED ORDER — MIDAZOLAM HCL 5 MG/5ML IJ SOLN
INTRAMUSCULAR | Status: AC
  Filled 2019-08-10: qty 10

## 2019-08-10 MED ORDER — MEPERIDINE HCL 50 MG/ML IJ SOLN
INTRAMUSCULAR | Status: AC
  Filled 2019-08-10: qty 1

## 2019-08-10 MED ORDER — SODIUM CHLORIDE 0.9 % IV SOLN: INTRAVENOUS | Status: DC

## 2019-08-10 MED ORDER — MIDAZOLAM HCL 5 MG/5ML IJ SOLN
INTRAMUSCULAR | Status: DC | PRN
  Administered 2019-08-10 (×2): 1 mg via INTRAVENOUS
  Administered 2019-08-10 (×2): 2 mg via INTRAVENOUS
  Administered 2019-08-10: 1 mg via INTRAVENOUS

## 2019-08-10 NOTE — Discharge Instructions (Signed)
Colonoscopy Discharge Instructions  Read the instructions outlined below and refer to this sheet in the next few weeks. These discharge instructions provide you with general information on caring for yourself after you leave the hospital. Your doctor may also give you specific instructions. While your treatment has been planned according to the most current medical practices available, unavoidable complications occasionally occur. If you have any problems or questions after discharge, call Dr. Gala Romney at [phone]. ACTIVITY  You may resume your regular activity, but move at a slower pace for the next 24 hours.   Take frequent rest periods for the next 24 hours.   Walking will help get rid of the air and reduce the bloated feeling in your belly (abdomen).   No driving for 24 hours (because of the medicine (anesthesia) used during the test).    Do not sign any important legal documents or operate any machinery for 24 hours (because of the anesthesia used during the test).  NUTRITION  Drink plenty of fluids.   You may resume your normal diet as instructed by your doctor.   Begin with a light meal and progress to your normal diet. Heavy or fried foods are harder to digest and may make you feel sick to your stomach (nauseated).   Avoid alcoholic beverages for 24 hours or as instructed.  MEDICATIONS  You may resume your normal medications unless your doctor tells you otherwise.  WHAT YOU CAN EXPECT TODAY  Some feelings of bloating in the abdomen.   Passage of more gas than usual.   Spotting of blood in your stool or on the toilet paper.  IF YOU HAD POLYPS REMOVED DURING THE COLONOSCOPY:  No aspirin products for 7 days or as instructed.   No alcohol for 7 days or as instructed.   Eat a soft diet for the next 24 hours.  FINDING OUT THE RESULTS OF YOUR TEST Not all test results are available during your visit. If your test results are not back during the visit, make an appointment  with your caregiver to find out the results. Do not assume everything is normal if you have not heard from your caregiver or the medical facility. It is important for you to follow up on all of your test results.  SEEK IMMEDIATE MEDICAL ATTENTION IF:  You have more than a spotting of blood in your stool.   Your belly is swollen (abdominal distention).   You are nauseated or vomiting.   You have a temperature over 101.   You have abdominal pain or discomfort that is severe or gets worse throughout the day.   Colon polyp and diverticulosis information provided  1 small polyp removed from your colon today  Further recommendations to follow pending review of pathology report  At patient request, I called Angie [facility] at [phone] - reviewed results   Colon Polyps  Polyps are tissue growths inside the body. Polyps can grow in many places, including the large intestine (colon). A polyp may be a round bump or a mushroom-shaped growth. You could have one polyp or several. Most colon polyps are noncancerous (benign). However, some colon polyps can become cancerous over time. Finding and removing the polyps early can help prevent this. What are the causes? The exact cause of colon polyps is not known. What increases the risk? You are more likely to develop this condition if you:  Have a family history of colon cancer or colon polyps.  Are older than 50 or older than 45  if you are African American.  Have inflammatory bowel disease, such as ulcerative colitis or Crohn's disease.  Have certain hereditary conditions, such as: ? Familial adenomatous polyposis. ? Lynch syndrome. ? Turcot syndrome. ? Peutz-Jeghers syndrome.  Are overweight.  Smoke cigarettes.  Do not get enough exercise.  Drink too much alcohol.  Eat a diet that is high in fat and red meat and low in fiber.  Had childhood cancer that was treated with abdominal radiation. What are the signs or symptoms? Most  polyps do not cause symptoms. If you have symptoms, they may include:  Blood coming from your rectum when having a bowel movement.  Blood in your stool. The stool may look dark red or black.  Abdominal pain.  A change in bowel habits, such as constipation or diarrhea. How is this diagnosed? This condition is diagnosed with a colonoscopy. This is a procedure in which a lighted, flexible scope is inserted into the anus and then passed into the colon to examine the area. Polyps are sometimes found when a colonoscopy is done as part of routine cancer screening tests. How is this treated? Treatment for this condition involves removing any polyps that are found. Most polyps can be removed during a colonoscopy. Those polyps will then be tested for cancer. Additional treatment may be needed depending on the results of testing. Follow these instructions at home: Lifestyle  Maintain a healthy weight, or lose weight if recommended by your health care provider.  Exercise every day or as told by your health care provider.  Do not use any products that contain nicotine or tobacco, such as cigarettes and e-cigarettes. If you need help quitting, ask your health care provider.  If you drink alcohol, limit how much you have: ? 0-1 drink a day for women. ? 0-2 drinks a day for men.  Be aware of how much alcohol is in your drink. In the [place] one drink equals one 12 oz bottle of beer (355 mL), one 5 oz glass of wine (148 mL), or one 1 oz shot of hard liquor (44 mL). Eating and drinking   Eat foods that are high in fiber, such as fruits, vegetables, and whole grains.  Eat foods that are high in calcium and vitamin D, such as milk, cheese, yogurt, eggs, liver, fish, and broccoli.  Limit foods that are high in fat, such as fried foods and desserts.  Limit the amount of red meat and processed meat you eat, such as hot dogs, sausage, bacon, and lunch meats. General instructions  Keep all follow-up  visits as told by your health care provider. This is important. ? This includes having regularly scheduled colonoscopies. ? Talk to your health care provider about when you need a colonoscopy. Contact a health care provider if:  You have new or worsening bleeding during a bowel movement.  You have new or increased blood in your stool.  You have a change in bowel habits.  You lose weight for no known reason. Summary  Polyps are tissue growths inside the body. Polyps can grow in many places, including the colon.  Most colon polyps are noncancerous (benign), but some can become cancerous over time.  This condition is diagnosed with a colonoscopy.  Treatment for this condition involves removing any polyps that are found. Most polyps can be removed during a colonoscopy. This information is not intended to replace advice given to you by your health care provider. Make sure you discuss any questions you have with your  health care provider. Document Revised: 05/21/2017 Document Reviewed: 05/21/2017 Elsevier Patient Education  [place].    Diverticulosis  Diverticulosis is a condition that develops when small pouches (diverticula) form in the wall of the large intestine (colon). The colon is where water is absorbed and stool (feces) is formed. The pouches form when the inside layer of the colon pushes through weak spots in the outer layers of the colon. You may have a few pouches or many of them. The pouches usually do not cause problems unless they become inflamed or infected. When this happens, the condition is called diverticulitis. What are the causes? The cause of this condition is not known. What increases the risk? The following factors may make you more likely to develop this condition:  Being older than age 1. Your risk for this condition increases with age. Diverticulosis is rare among people younger than age 45. By age 45, many people have it.  Eating a low-fiber  diet.  Having frequent constipation.  Being overweight.  Not getting enough exercise.  Smoking.  Taking over-the-counter pain medicines, like aspirin and ibuprofen.  Having a family history of diverticulosis. What are the signs or symptoms? In most people, there are no symptoms of this condition. If you do have symptoms, they may include:  Bloating.  Cramps in the abdomen.  Constipation or diarrhea.  Pain in the lower left side of the abdomen. How is this diagnosed? Because diverticulosis usually has no symptoms, it is most often diagnosed during an exam for other colon problems. The condition may be diagnosed by:  Using a flexible scope to examine the colon (colonoscopy).  Taking an X-ray of the colon after dye has been put into the colon (barium enema).  Having a CT scan. How is this treated? You may not need treatment for this condition. Your health care provider may recommend treatment to prevent problems. You may need treatment if you have symptoms or if you previously had diverticulitis. Treatment may include:  Eating a high-fiber diet.  Taking a fiber supplement.  Taking a live bacteria supplement (probiotic).  Taking medicine to relax your colon. Follow these instructions at home: Medicines  Take over-the-counter and prescription medicines only as told by your health care provider.  If told by your health care provider, take a fiber supplement or probiotic. Constipation prevention Your condition may cause constipation. To prevent or treat constipation, you may need to:  Drink enough fluid to keep your urine pale yellow.  Take over-the-counter or prescription medicines.  Eat foods that are high in fiber, such as beans, whole grains, and fresh fruits and vegetables.  Limit foods that are high in fat and processed sugars, such as fried or sweet foods.  General instructions  Try not to strain when you have a bowel movement.  Keep all follow-up visits  as told by your health care provider. This is important. Contact a health care provider if you:  Have pain in your abdomen.  Have bloating.  Have cramps.  Have not had a bowel movement in 3 days. Get help right away if:  Your pain gets worse.  Your bloating becomes very bad.  You have a fever or chills, and your symptoms suddenly get worse.  You vomit.  You have bowel movements that are bloody or black.  You have bleeding from your rectum. Summary  Diverticulosis is a condition that develops when small pouches (diverticula) form in the wall of the large intestine (colon).  You may have  a few pouches or many of them.  This condition is most often diagnosed during an exam for other colon problems.  Treatment may include increasing the fiber in your diet, taking supplements, or taking medicines. This information is not intended to replace advice given to you by your health care provider. Make sure you discuss any questions you have with your health care provider. Document Revised: 09/02/2018 Document Reviewed: 09/02/2018 Elsevier Patient Education  [place].

## 2019-08-10 NOTE — H&P (Signed)
_0 @   Primary Care Physician:  [name], MD Primary Gastroenterologist:  Dr. Gala Romney  Pre-Procedure History & Physical: HPI:  Angie Jennings is a 69 y.o. female is here for a screening colonoscopy.  Average screening examination.  Negative colonoscopy 2011.  No family history: Cancer/polyps.  No bowel symptoms currently.  Past Medical History:  Diagnosis Date  . Anxiety   . Arthritis    OA  . 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 Breast disorder   . Childhood asthma   . Elevated cholesterol with elevated triglycerides 10/05/2017   Add zocor  . GERD (gastroesophageal reflux disease)   . Radiation    as child for treatment of keloids on face and neck    Past Surgical History:  Procedure Laterality Date  . ABDOMINAL HYSTERECTOMY  2006  . back surgery  1995   fusion of L4 & L5  . bravo capsule endoscopy  05/02/09   GERD with adequate acid suppression  . BRAVO [place] STUDY  11/21/2011   Procedure: BRAVO [place];  Surgeon: Danie Binder, MD;  [facility];  Service: Endoscopy;  Laterality: N/A;  . BREAST BIOPSY  10/05/14  . BREAST RECONSTRUCTION WITH PLACEMENT OF TISSUE EXPANDER AND FLEX HD (ACELLULAR HYDRATED DERMIS) Bilateral 11/01/2014   Procedure: BILATERAL BREAST RECONSTRUCTION WITH PLACEMENT OF TISSUE EXPANDER AND FLEX HD (ACELLULAR HYDRATED DERMIS);  Surgeon: Irene Limbo, MD;  Location: [place];  Service: Plastics;  Laterality: Bilateral;  . BREAST SURGERY  AUGUST 2015   BREAST BIOPSY   . CATARACT EXTRACTION W/PHACO Right 07/23/2015   Procedure: CATARACT EXTRACTION PHACO AND INTRAOCULAR LENS PLACEMENT RIGHT EYE CDE=1.72;  Surgeon: Williams Che, MD;  Location: [facility];  Service: Ophthalmology;  Laterality: Right;  . CATARACT EXTRACTION W/PHACO Left 11/05/2015   Procedure: CATARACT EXTRACTION PHACO AND INTRAOCULAR LENS PLACEMENT; CDE:  3.17;  Surgeon: Williams Che, MD;  Location: [facility];  Service: Ophthalmology;  Laterality: Left;   . COLONOSCOPY  07/2009   Dr. Oneida Alar: rare diverticulae, hemorrhoids. next TCS 07/2019 with pediatric colonoscope.  . ESOPHAGOGASTRODUODENOSCOPY  04/20/09   mild gastritis  . ESOPHAGOGASTRODUODENOSCOPY  07/27/09   small internal hemorrhoids/rare sigmoid colon diverticula other wise no polyps  . ESOPHAGOGASTRODUODENOSCOPY  11/21/2011   SLF: 1. the mucosa of the esophagus appeared normal 2. NOn-erosive gastritis (inflammation) was found multiple biopsies 3. the duodenal mucosa showed no abnormalitis.   [name] Kitchen ESOPHAGOGASTRODUODENOSCOPY N/A 06/22/2017   Procedure: ESOPHAGOGASTRODUODENOSCOPY (EGD);  Surgeon: Danie Binder, MD;  [facility];  Service: Endoscopy;  Laterality: N/A;  1:00pm  . LIPOSUCTION WITH LIPOFILLING Bilateral 03/13/2015   Procedure: LIPO FILLING TO BILATERAL BREAST FROM ABDOMEN;  Surgeon: Irene Limbo, MD;  Location: [place];  Service: Plastics;  Laterality: Bilateral;  . NIPPLE SPARING MASTECTOMY/SENTINAL LYMPH NODE BIOPSY/RECONSTRUCTION/PLACEMENT OF TISSUE EXPANDER Bilateral 11/01/2014   Procedure: LEFT NIPPLE SPARING MASTECTOMY WITH LEFT  SENTINAL LYMPH NODE BIOPSY AND RIGHT PROPYLACTIC NIPPLE SPARING MASTECTOMY;  Surgeon: Rolm Bookbinder, MD;  Location: [place];  Service: General;  Laterality: Bilateral;  . REMOVAL OF BILATERAL TISSUE EXPANDERS WITH PLACEMENT OF BILATERAL BREAST IMPLANTS Bilateral 03/13/2015   Procedure: REMOVAL OF BILATERAL TISSUE EXPANDERS WITH PLACEMENT OF BILATERAL BREAST IMPLANTS;  Surgeon: Irene Limbo, MD;  Location: [place];  Service: Plastics;  Laterality: Bilateral;    Prior to Admission medications   Medication Sig Start Date End Date Taking? Authorizing Provider  acetaminophen (TYLENOL) 500 MG  tablet Take 500 mg by mouth every 6 (six) hours as needed (back pain.). Reported on 05/09/2015   Yes [provider]  Calcium Citrate-Vitamin D (CITRACAL PETITES/VITAMIN D) 200-250  MG-UNIT TABS Take 2 tablets by mouth daily.   Yes [provider]  Na Sulfate-K Sulfate-Mg Sulf (SUPREP BOWEL PREP KIT) 17.5-3.13-1.6 GM/177ML SOLN Take 1 kit by mouth as directed. 06/22/19  Yes Daschel Roughton, Cristopher Estimable, MD  RABEprazole (ACIPHEX) 20 MG tablet Take 1 tablet (20 mg total) by mouth 2 (two) times daily. 06/22/19  Yes Fields, Sandi L, MD  simvastatin (ZOCOR) 20 MG tablet Take 1 daily Patient taking differently: Take 20 mg by mouth every evening.  08/03/18  Yes Derrek Monaco A, NP  venlafaxine (EFFEXOR) 37.5 MG tablet TAKE ONE TABLET BY MOUTH DAILY. Patient taking differently: Take 37.5 mg by mouth daily.  07/19/19  Yes Estill Dooms, NP  albuterol (PROVENTIL HFA;VENTOLIN HFA) 108 (90 Base) MCG/ACT inhaler Inhale 1-2 puffs into the lungs every 4 (four) hours as needed for wheezing or shortness of breath.    [provider]  methocarbamol (ROBAXIN) 500 MG tablet Take 1 tablet (500 mg total) by mouth 4 (four) times daily. Patient taking differently: Take 500 mg by mouth 4 (four) times daily as needed for muscle spasms.  03/10/19   Estill Dooms, NP    Allergies as of 06/22/2019 - Review Complete 06/22/2019  Allergen Reaction Noted  . Nsaids Other (See Comments)   . Aspirin Other (See Comments) 02/23/2015  . Ioxaglate Hives 02/23/2015  . Ivp dye [iodinated diagnostic agents] Hives 10/25/2014  . Kapidex [dexlansoprazole] Diarrhea 11/12/2011  . Lidocaine Nausea Only 11/01/2014  . Metronidazole    . Clarithromycin Rash     Family History  Problem Relation Age of Onset  . Dementia Mother   . Esophageal cancer Mother 7  . Hypertension Father   . Lung cancer Father 70       smoker  . Prostate cancer Brother 78  . Dementia Maternal Aunt   . Bladder Cancer Maternal Uncle 53       former smoker  . Heart attack Maternal Grandmother   . Dementia Maternal Grandfather   . Esophageal cancer Maternal Aunt 72       non-smoker  . Brain cancer Maternal Uncle 63        Meningioma - benign  . Esophageal cancer Maternal Uncle 61  . Colon cancer Neg Hx   . Colon polyps Neg Hx     Social History   Socioeconomic History  . Marital status: Married    Spouse name: Not on file  . Number of children: 3  . Years of education: Not on file  . Highest education level: Not on file  Occupational History  . Not on file  Tobacco Use  . Smoking status: Never Smoker  . Smokeless tobacco: Never Used  Vaping Use  . Vaping Use: Never used  Substance and Sexual Activity  . Alcohol use: Yes    Alcohol/week: 0.0 standard drinks    Comment: occ  . Drug use: No  . Sexual activity: Yes    Birth control/protection: Surgical    Comment: hyst  Other Topics Concern  . Not on file  Social History Narrative  . Not on file   Social Determinants of Health   Financial Resource Strain:   . Difficulty of Paying Living Expenses:   Food Insecurity:   . Worried About Charity fundraiser in the Last Year:   .  Ran Out of Food in the Last Year:   Transportation Needs:   . Film/video editor (Medical):   [name] Kitchen Lack of Transportation (Non-Medical):   Physical Activity:   . Days of Exercise per Week:   . Minutes of Exercise per Session:   Stress:   . Feeling of Stress :   Social Connections:   . Frequency of Communication with Friends and Family:   . Frequency of Social Gatherings with Friends and Family:   . Attends Religious Services:   . Active Member of Clubs or Organizations:   . Attends Archivist Meetings:   [name] Kitchen Marital Status:   Intimate Partner Violence:   . Fear of Current or Ex-Partner:   . Emotionally Abused:   [name] Kitchen Physically Abused:   . Sexually Abused:     Review of Systems: See HPI, otherwise negative ROS  Physical Exam: BP (!) 152/94   Pulse 83   Temp 97.9 F (36.6 C) (Oral)   Resp 11   Ht 5' 7.5" (1.715 m)   Wt 69.9 kg   SpO2 99%   BMI 23.76 kg/m  General:   Alert,  Well-developed, well-nourished, pleasant and cooperative in  NAD Neck:  Supple; no masses or thyromegaly. Lungs:  Clear throughout to auscultation.   No wheezes, crackles, or rhonchi. No acute distress. Heart:  Regular rate and rhythm; no murmurs, clicks, rubs,  or gallops. Abdomen:  Soft, nontender and nondistended. No masses, hepatosplenomegaly or hernias noted. Normal bowel sounds, without guarding, and without rebound.    Impression/Plan: AVYANA PUFFENBARGER is now here to undergo a screening colonoscopy.   Average rescreening examination  Risks, benefits, limitations, imponderables and alternatives regarding colonoscopy have been reviewed with the patient. Questions have been answered. All parties agreeable.     Notice:  This dictation was prepared with Dragon dictation along with smaller phrase technology. Any transcriptional errors that result from this process are unintentional and may not be corrected upon review.

## 2019-08-10 NOTE — Op Note (Signed)
[facility] Patient Name: Angie Jennings Procedure Date: 08/10/2019 10:56 AM MRN: [id number] Date of Birth: [birth year] Attending MD: Norvel Richards , MD CSN: [id number] Age: 69 Admit Type: Outpatient Procedure:                Colonoscopy Indications:              Screening for colorectal malignant neoplasm Providers:                Norvel Richards, MD, Lurline Del, RN, Crystal                            Page, Randa Spike, Technician Referring MD:              Medicines:                Midazolam 7 mg IV, Meperidine 40 mg IV Complications:            No immediate complications. Estimated Blood Loss:     Estimated blood loss was minimal. Procedure:                Pre-Anesthesia Assessment:                           - Prior to the procedure, a History and Physical                            was performed, and patient medications and                            allergies were reviewed. The patient's tolerance of                            previous anesthesia was also reviewed. The risks                            and benefits of the procedure and the sedation                            options and risks were discussed with the patient.                            All questions were answered, and informed consent                            was obtained. Prior Anticoagulants: The patient has                            taken no previous anticoagulant or antiplatelet                            agents. ASA Grade Assessment: II - A patient with                            mild systemic disease. After reviewing the risks  and benefits, the patient was deemed in                            satisfactory condition to undergo the procedure.                           After obtaining informed consent, the colonoscope                            was passed under direct vision. Throughout the                            procedure, the patient's blood pressure, pulse,  and                            oxygen saturations were monitored continuously. The                            CF-HQ190L ([id number]) scope was introduced through                            the anus and advanced to the the cecum, identified                            by appendiceal orifice and ileocecal valve. The                            colonoscopy was performed without difficulty. The                            patient tolerated the procedure well. The quality                            of the bowel preparation was adequate. Scope In: 12:42:51 PM Scope Out: 1:00:08 PM Scope Withdrawal Time: 0 hours 11 minutes 55 seconds  Total Procedure Duration: 0 hours 17 minutes 17 seconds  Findings:      The perianal and digital rectal examinations were normal.      Scattered medium-mouthed diverticula were found in the sigmoid colon.      A 3 mm polyp was found in the recto-sigmoid colon. The polyp was       sessile. The polyp was removed with a cold biopsy forceps. Resection and       retrieval were complete. Estimated blood loss was minimal.      The exam was otherwise without abnormality on direct and retroflexion       views. Impression:               - Diverticulosis in the sigmoid colon. One 3 mm                            polyp at the recto-sigmoid colon, removed with a                            cold biopsy forceps. Resected and retrieved. distal  5 cm of TI appeared normal.                           - The examination was otherwise normal on direct                            and retroflexion views. Moderate Sedation:      Moderate (conscious) sedation was administered by the endoscopy nurse       and supervised by the endoscopist. The following parameters were       monitored: oxygen saturation, heart rate, blood pressure, respiratory       rate, EKG, adequacy of pulmonary ventilation, and response to care.       Total physician intraservice time was 23  minutes. Recommendation:           - Patient has a contact number available for                            emergencies. The signs and symptoms of potential                            delayed complications were discussed with the                            patient. Return to normal activities tomorrow.                            Written discharge instructions were provided to the                            patient.                           - Resume previous diet.                           - Repeat colonoscopy date to be determined after                            pending pathology results are reviewed for                            surveillance based on pathology results.                           - Return to GI office in 1 year. Procedure Code(s):        --- Professional ---                           [phone], Colonoscopy, flexible; with biopsy, single                            or multiple                           99153, Moderate sedation; each additional 15  minutes intraservice time                           G0500, Moderate sedation services provided by the                            same physician or other qualified health care                            professional performing a gastrointestinal                            endoscopic service that sedation supports,                            requiring the presence of an independent trained                            observer to assist in the monitoring of the                            patient's level of consciousness and physiological                            status; initial 15 minutes of intra-service time;                            patient age 72 years or older (additional time may                            be reported with [phone], as appropriate) Diagnosis Code(s):        --- Professional ---                           Z12.11, Encounter for screening for malignant                            neoplasm of  colon                           K63.5, Polyp of colon                           K57.30, Diverticulosis of large intestine without                            perforation or abscess without bleeding CPT copyright 2019 American Medical Association. All rights reserved. The codes documented in this report are preliminary and upon coder review may  be revised to meet current compliance requirements. Cristopher Estimable. Sarahi Borland, MD Norvel Richards, MD 08/10/2019 1:12:33 PM This report has been signed electronically. Number of Addenda: 0

## 2019-08-11 LAB — SURGICAL PATHOLOGY

## 2019-08-12 ENCOUNTER — Encounter: Payer: Self-pay | Admitting: Internal Medicine

## 2019-08-15 ENCOUNTER — Encounter (HOSPITAL_COMMUNITY): Payer: Self-pay | Admitting: Internal Medicine

## 2019-09-20 ENCOUNTER — Telehealth: Payer: Self-pay | Admitting: Adult Health

## 2019-09-20 DIAGNOSIS — Z1321 Encounter for screening for nutritional disorder: Secondary | ICD-10-CM

## 2019-09-20 DIAGNOSIS — Z131 Encounter for screening for diabetes mellitus: Secondary | ICD-10-CM

## 2019-09-20 DIAGNOSIS — Z1329 Encounter for screening for other suspected endocrine disorder: Secondary | ICD-10-CM

## 2019-09-20 DIAGNOSIS — E782 Mixed hyperlipidemia: Secondary | ICD-10-CM

## 2019-09-20 DIAGNOSIS — Z130 Encounter for screening for diseases of the blood and blood-forming organs and certain disorders involving the immune mechanism: Secondary | ICD-10-CM

## 2019-09-20 MED ORDER — SIMVASTATIN 20 MG PO TABS: 20.0000 mg | ORAL_TABLET | Freq: Every evening | ORAL | 3 refills | Status: DC

## 2019-09-20 NOTE — Telephone Encounter (Signed)
Needs refill on zocor, will do and will place orders today for fasting labs

## 2019-10-05 LAB — CBC
Hematocrit: 44.9 % (ref 34.0–46.6)
Hemoglobin: 15 g/dL (ref 11.1–15.9)
MCH: 30.2 pg (ref 26.6–33.0)
MCHC: 33.4 g/dL (ref 31.5–35.7)
MCV: 91 fL (ref 79–97)
Platelets: 256 10*3/uL (ref 150–450)
RBC: 4.96 x10E6/uL (ref 3.77–5.28)
RDW: 13.1 % (ref 11.7–15.4)
WBC: 7.6 10*3/uL (ref 3.4–10.8)

## 2019-10-05 LAB — COMPREHENSIVE METABOLIC PANEL
ALT: 17 IU/L (ref 0–32)
AST: 18 IU/L (ref 0–40)
Albumin/Globulin Ratio: 1.8 (ref 1.2–2.2)
Albumin: 4.1 g/dL (ref 3.8–4.8)
Alkaline Phosphatase: 140 IU/L — ABNORMAL HIGH (ref 48–121)
BUN/Creatinine Ratio: 20 (ref 12–28)
BUN: 16 mg/dL (ref 8–27)
Bilirubin Total: 0.2 mg/dL (ref 0.0–1.2)
CO2: 23 mmol/L (ref 20–29)
Calcium: 9.3 mg/dL (ref 8.7–10.3)
Chloride: 103 mmol/L (ref 96–106)
Creatinine, Ser: 0.81 mg/dL (ref 0.57–1.00)
GFR calc Af Amer: 86 mL/min/{1.73_m2} (ref >59)
GFR calc non Af Amer: 75 mL/min/{1.73_m2} (ref >59)
Globulin, Total: 2.3 g/dL (ref 1.5–4.5)
Glucose: 109 mg/dL — ABNORMAL HIGH (ref 65–99)
Potassium: 4.6 mmol/L (ref 3.5–5.2)
Sodium: 143 mmol/L (ref 134–144)
Total Protein: 6.4 g/dL (ref 6.0–8.5)

## 2019-10-05 LAB — LIPID PANEL
Chol/HDL Ratio: 4 ratio (ref 0.0–4.4)
Cholesterol, Total: 172 mg/dL (ref 100–199)
HDL: 43 mg/dL (ref >39)
LDL Chol Calc (NIH): 87 mg/dL (ref 0–99)
Triglycerides: 250 mg/dL — ABNORMAL HIGH (ref 0–149)
VLDL Cholesterol Cal: 42 mg/dL — ABNORMAL HIGH (ref 5–40)

## 2019-10-05 LAB — HEMOGLOBIN A1C
Est. average glucose Bld gHb Est-mCnc: 117 mg/dL
Hgb A1c MFr Bld: 5.7 % — ABNORMAL HIGH (ref 4.8–5.6)

## 2019-10-05 LAB — VITAMIN D 25 HYDROXY (VIT D DEFICIENCY, FRACTURES): Vit D, 25-Hydroxy: 32.2 ng/mL (ref 30.0–100.0)

## 2019-10-05 LAB — TSH: TSH: 2.4 u[IU]/mL (ref 0.450–4.500)

## 2019-10-31 ENCOUNTER — Other Ambulatory Visit: Payer: Self-pay

## 2019-10-31 MED ORDER — VENLAFAXINE HCL 37.5 MG PO TABS: 37.5000 mg | ORAL_TABLET | Freq: Every day | ORAL | 3 refills | Status: DC

## 2019-12-13 ENCOUNTER — Inpatient Hospital Stay (HOSPITAL_COMMUNITY): Payer: Medicare PPO | Attending: Hematology

## 2019-12-20 ENCOUNTER — Ambulatory Visit (HOSPITAL_COMMUNITY): Payer: Medicare Other | Admitting: Hematology

## 2020-05-08 ENCOUNTER — Encounter: Payer: Self-pay | Admitting: Internal Medicine

## 2020-08-13 ENCOUNTER — Other Ambulatory Visit: Payer: Self-pay | Admitting: Adult Health

## 2020-09-21 ENCOUNTER — Telehealth: Payer: Self-pay | Admitting: Internal Medicine

## 2020-09-21 NOTE — Telephone Encounter (Signed)
Lmom for pt to call us back. 

## 2020-09-21 NOTE — Telephone Encounter (Signed)
Pt called and is aware of her one year follow up appointment with Dr Abbey Chatters on 11/08/2020. She asked to have enough aciphex called into [place] to hold her until her appointment.

## 2020-09-26 NOTE — Telephone Encounter (Signed)
Pt almost out of Aciphex 20 mg twice a day.  Can we send RX to [place] until her appointment please?

## 2020-09-27 MED ORDER — RABEPRAZOLE SODIUM 20 MG PO TBEC: 20.0000 mg | DELAYED_RELEASE_TABLET | Freq: Two times a day (BID) | ORAL | 0 refills | Status: DC

## 2020-09-27 NOTE — Telephone Encounter (Signed)
Rx sent 

## 2020-09-27 NOTE — Addendum Note (Signed)
Addended by: Aliene Altes on: 09/27/2020 09:42 PM   Modules accepted: Orders

## 2020-09-28 NOTE — Telephone Encounter (Signed)
Pt made aware that RX has been sent.

## 2020-11-06 ENCOUNTER — Other Ambulatory Visit: Payer: Self-pay

## 2020-11-06 ENCOUNTER — Other Ambulatory Visit (HOSPITAL_COMMUNITY)
Admission: RE | Admit: 2020-11-06 | Discharge: 2020-11-06 | Disposition: A | Discharge status: Home / Self Care | Payer: Medicare PPO | Source: Ambulatory Visit | Attending: Adult Health | Admitting: Adult Health

## 2020-11-06 ENCOUNTER — Ambulatory Visit (INDEPENDENT_AMBULATORY_CARE_PROVIDER_SITE_OTHER): Payer: Medicare PPO | Admitting: Adult Health

## 2020-11-06 ENCOUNTER — Encounter: Payer: Self-pay | Admitting: Adult Health

## 2020-11-06 VITALS — BP 157/76 | HR 70 | Ht 67.0 in | Wt 125.0 lb

## 2020-11-06 DIAGNOSIS — R7989 Other specified abnormal findings of blood chemistry: Secondary | ICD-10-CM

## 2020-11-06 DIAGNOSIS — Z1211 Encounter for screening for malignant neoplasm of colon: Secondary | ICD-10-CM

## 2020-11-06 DIAGNOSIS — Z113 Encounter for screening for infections with a predominantly sexual mode of transmission: Secondary | ICD-10-CM

## 2020-11-06 DIAGNOSIS — Z1329 Encounter for screening for other suspected endocrine disorder: Secondary | ICD-10-CM

## 2020-11-06 DIAGNOSIS — Z01419 Encounter for gynecological examination (general) (routine) without abnormal findings: Secondary | ICD-10-CM

## 2020-11-06 DIAGNOSIS — N816 Rectocele: Secondary | ICD-10-CM

## 2020-11-06 DIAGNOSIS — E782 Mixed hyperlipidemia: Secondary | ICD-10-CM | POA: Diagnosis not present

## 2020-11-06 DIAGNOSIS — Z853 Personal history of malignant neoplasm of breast: Secondary | ICD-10-CM

## 2020-11-06 DIAGNOSIS — M8580 Other specified disorders of bone density and structure, unspecified site: Secondary | ICD-10-CM

## 2020-11-06 DIAGNOSIS — R159 Full incontinence of feces: Secondary | ICD-10-CM

## 2020-11-06 DIAGNOSIS — N3941 Urge incontinence: Secondary | ICD-10-CM

## 2020-11-06 LAB — HEMOCCULT GUIAC POC 1CARD (OFFICE): Fecal Occult Blood, POC: NEGATIVE

## 2020-11-06 NOTE — Progress Notes (Addendum)
Patient ID: Angie Jennings, female   DOB: [birth year], 70 y.o.   MRN: [id number] History of Present Illness: Angie Jennings is a 70 year old white female, married, sp hysterectomy, in for a well woman gyn exam. She had syncope  episode and landed on her face and is now wearing heart monitor. She was seen in ER and tested +for COVID then too. About 6 weeks before had vertigo.She has had urge incontinence at times and stool leakage. She has fibrosis of breast and implants. Stools more loose with GERD meds. +stress She has lost weight since January. PCP is Dr Willey Blade.   Current Medications, Allergies, Past Medical History, Past Surgical History, Family History and Social History were reviewed in [employer] record.     Review of Systems: Patient denies any headaches, hearing loss, fatigue, blurred vision, shortness of breath, chest pain, abdominal pain, problems with or intercourse. No joint pain or mood swings.  See HPI for positives.   Physical Exam:BP (!) 157/76 (BP Location: Right Arm, Patient Position: Sitting, Cuff Size: Normal)   Pulse 70   Ht 5\' 7"  (1.702 m)   Wt 125 lb (56.7 kg)   BMI 19.58 kg/m   General:  Well developed, well nourished, no acute distress Skin:  Warm and dry Neck:  Midline trachea, normal thyroid, good ROM, no lymphadenopathy,no carotid bruits heard Lungs; Clear to auscultation bilaterally Breast: Had mastectomy and has bilateral implants, ?fibrosis Cardiovascular: Regular rate and rhythm Abdomen:  Soft, non tender, no hepatosplenomegaly Pelvic:  External genitalia is normal in appearance, no lesions.  The vagina is pale with loss of moisture and rugae. Urethra has no lesions or masses. The cervix and uterus are absent.Vaginal cuff, no lesions. No adnexal masses or tenderness noted.Bladder is non tender, no masses felt.CV swab obtained. Rectal: Poor sphincter tone, no polyps, or hemorrhoids felt.  Hemoccult negative.+rectocele.   Extremities/musculoskeletal:  No swelling or varicosities noted, no clubbing or cyanosis Psych:  No mood changes, alert and cooperative,seems happy AA is 1 Fall risk is low Depression screen [facility] 2/9 11/06/2020 05/28/2017  Decreased Interest 1 0  Down, Depressed, Hopeless 1 0  PHQ - 2 Score 2 0  Altered sleeping 1 -  Tired, decreased energy 1 -  Change in appetite 0 -  Feeling bad or failure about yourself  0 -  Trouble concentrating 1 -  Moving slowly or fidgety/restless 0 -  Suicidal thoughts 0 -  PHQ-9 Score 5 -    GAD 7 : Generalized Anxiety Score 11/06/2020  Nervous, Anxious, on Edge 0  Control/stop worrying 1  Worry too much - different things 1  Trouble relaxing 1  Restless 1  Easily annoyed or irritable 1  Afraid - awful might happen 0  Total GAD 7 Score 5      Upstream - 11/06/20 1429       Pregnancy Intention Screening   Does the patient want to become pregnant in the next year? No    Does the patient's partner want to become pregnant in the next year? No    Would the patient like to discuss contraceptive options today? No      Contraception Wrap Up   Current Method No Method - Other Reason   HYSTERECTOMY   End Method No Method - Other Reason    Contraception Counseling Provided No            Examination chaperoned by Tish RN  Impression and Plan: 1. Encounter for well woman exam  with routine gynecological exam Will check labs - Comprehensive metabolic panel - CBC Continue Effexor has refills   2. Encounter for screening fecal occult blood testing - POCT occult blood stool  3. Elevated cholesterol with elevated triglycerides Continue Zocor, has refills - Lipid panel  4. Screening for thyroid disorder - TSH  5. Low vitamin D level Check vitamin D level  6. Urge incontinence of urine   7. Rectocele   8. Incontinence of feces, unspecified fecal incontinence type Has appt with GI Thursday to let them know  9. Osteopenia, unspecified  location Dexa scheduled for 11/08/20 at 12:30 pm at [place] (DXA); Future  10. Screening examination for STD (sexually transmitted disease) CV swab sent for GC/CHL,trich,BV and yeast  - Cervicovaginal ancillary only( [place])   11. History of breast cancer To see plastic surgeon this week about implants

## 2020-11-08 ENCOUNTER — Ambulatory Visit: Payer: Medicare PPO | Admitting: Internal Medicine

## 2020-11-08 ENCOUNTER — Ambulatory Visit (HOSPITAL_COMMUNITY)
Admission: RE | Admit: 2020-11-08 | Discharge: 2020-11-08 | Disposition: A | Discharge status: Home / Self Care | Payer: Medicare PPO | Source: Ambulatory Visit | Attending: Adult Health | Admitting: Adult Health

## 2020-11-08 ENCOUNTER — Other Ambulatory Visit: Payer: Self-pay

## 2020-11-08 DIAGNOSIS — Z780 Asymptomatic menopausal state: Secondary | ICD-10-CM | POA: Diagnosis not present

## 2020-11-08 DIAGNOSIS — Z853 Personal history of malignant neoplasm of breast: Secondary | ICD-10-CM | POA: Diagnosis not present

## 2020-11-08 DIAGNOSIS — M8589 Other specified disorders of bone density and structure, multiple sites: Secondary | ICD-10-CM | POA: Insufficient documentation

## 2020-11-08 DIAGNOSIS — Z13820 Encounter for screening for osteoporosis: Secondary | ICD-10-CM | POA: Insufficient documentation

## 2020-11-08 DIAGNOSIS — M8580 Other specified disorders of bone density and structure, unspecified site: Secondary | ICD-10-CM

## 2020-11-08 LAB — CERVICOVAGINAL ANCILLARY ONLY
Bacterial Vaginitis (gardnerella): NEGATIVE
Candida Glabrata: NEGATIVE
Candida Vaginitis: NEGATIVE
Chlamydia: NEGATIVE
Comment: NEGATIVE
Comment: NEGATIVE
Comment: NEGATIVE
Comment: NEGATIVE
Comment: NEGATIVE
Comment: NORMAL
Neisseria Gonorrhea: NEGATIVE
Trichomonas: NEGATIVE

## 2020-11-09 LAB — CBC
Hematocrit: 44.9 % (ref 34.0–46.6)
Hemoglobin: 14.6 g/dL (ref 11.1–15.9)
MCH: 29.6 pg (ref 26.6–33.0)
MCHC: 32.5 g/dL (ref 31.5–35.7)
MCV: 91 fL (ref 79–97)
Platelets: 240 10*3/uL (ref 150–450)
RBC: 4.93 x10E6/uL (ref 3.77–5.28)
RDW: 13 % (ref 11.7–15.4)
WBC: 7.2 10*3/uL (ref 3.4–10.8)

## 2020-11-09 LAB — COMPREHENSIVE METABOLIC PANEL
ALT: 16 IU/L (ref 0–32)
AST: 17 IU/L (ref 0–40)
Albumin/Globulin Ratio: 1.9 (ref 1.2–2.2)
Albumin: 3.9 g/dL (ref 3.8–4.8)
Alkaline Phosphatase: 186 IU/L — ABNORMAL HIGH (ref 44–121)
BUN/Creatinine Ratio: 14 (ref 12–28)
BUN: 11 mg/dL (ref 8–27)
Bilirubin Total: 0.3 mg/dL (ref 0.0–1.2)
CO2: 24 mmol/L (ref 20–29)
Calcium: 9.3 mg/dL (ref 8.7–10.3)
Chloride: 101 mmol/L (ref 96–106)
Creatinine, Ser: 0.76 mg/dL (ref 0.57–1.00)
Globulin, Total: 2.1 g/dL (ref 1.5–4.5)
Glucose: 98 mg/dL (ref 65–99)
Potassium: 4.4 mmol/L (ref 3.5–5.2)
Sodium: 139 mmol/L (ref 134–144)
Total Protein: 6 g/dL (ref 6.0–8.5)
eGFR: 85 mL/min/{1.73_m2} (ref >59)

## 2020-11-09 LAB — LIPID PANEL
Chol/HDL Ratio: 3.3 ratio (ref 0.0–4.4)
Cholesterol, Total: 130 mg/dL (ref 100–199)
HDL: 40 mg/dL (ref >39)
LDL Chol Calc (NIH): 67 mg/dL (ref 0–99)
Triglycerides: 132 mg/dL (ref 0–149)
VLDL Cholesterol Cal: 23 mg/dL (ref 5–40)

## 2020-11-09 LAB — TSH: TSH: 1.46 u[IU]/mL (ref 0.450–4.500)

## 2020-11-09 LAB — VITAMIN D 25 HYDROXY (VIT D DEFICIENCY, FRACTURES): Vit D, 25-Hydroxy: 51.3 ng/mL (ref 30.0–100.0)

## 2020-11-20 ENCOUNTER — Other Ambulatory Visit: Payer: Self-pay | Admitting: Gastroenterology

## 2020-12-11 ENCOUNTER — Other Ambulatory Visit: Payer: Self-pay

## 2020-12-11 ENCOUNTER — Encounter: Payer: Self-pay | Admitting: Internal Medicine

## 2020-12-11 ENCOUNTER — Telehealth: Payer: Self-pay

## 2020-12-11 ENCOUNTER — Ambulatory Visit: Payer: Medicare PPO | Admitting: Internal Medicine

## 2020-12-11 VITALS — BP 139/76 | HR 69 | Temp 96.9°F | Ht 67.0 in | Wt 126.6 lb

## 2020-12-11 DIAGNOSIS — R748 Abnormal levels of other serum enzymes: Secondary | ICD-10-CM

## 2020-12-11 DIAGNOSIS — K219 Gastro-esophageal reflux disease without esophagitis: Secondary | ICD-10-CM

## 2020-12-11 DIAGNOSIS — R151 Fecal smearing: Secondary | ICD-10-CM

## 2020-12-11 MED ORDER — CHOLESTYRAMINE 4 G PO PACK: PACK | ORAL | 11 refills | Status: DC

## 2020-12-11 NOTE — Patient Instructions (Signed)
It was good to see you again today!  As discussed, it sounds like you need twice daily dosing of Aciphex.  You should continue this medication twice daily.  However, it is best taken before a meal so I would take your Aciphex before breakfast and before supper every day.  I recommend you try cholestyramine (which again is off label) to bind the stool more in your rectum so it comes out more at 1 time with less residue to leak in between BMs.  Specifically, we will start on cholestyramine 2 g taken daily-not to be taken within 2 hours before or after taking any other of your medications (30 - 2 g doses with 11 refills)  I like the idea of your taking yogurt every day.  Adding a probiotic while not make much difference.  Likewise, I suspect taking extra fiber supplement would not make much difference either and could make your anorectal symptoms worse.  I recommend you start Kegel exercises once again  This is an initial plan of management for your bowel symptoms.  There is subject to change depending on your progress over time.  Keep a stool diary.  We need to communicate in 1 month and see how your bowel function is doing  Mildly elevated alkaline phosphatase is nonspecific; we will obtain an antimitochondrial antibody and a liver ultrasound.  Office visit here in 3 months  Further recommendations to follow.
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
## 2020-12-11 NOTE — Telephone Encounter (Signed)
[place] abd ruq scheduled for 12/20/20 at 8:30am, arrive at 8:15am. NPO after midnight before test.   Tried to call pt to inform her of [place] appt, left detailed message on VM to inform her of appt. Letter mailed. Pt is also active on MyChart.

## 2020-12-20 ENCOUNTER — Ambulatory Visit (HOSPITAL_COMMUNITY)
Admission: RE | Admit: 2020-12-20 | Discharge: 2020-12-20 | Disposition: A | Discharge status: Home / Self Care | Payer: Medicare PPO | Source: Ambulatory Visit | Attending: Internal Medicine | Admitting: Internal Medicine

## 2020-12-20 ENCOUNTER — Other Ambulatory Visit: Payer: Self-pay

## 2020-12-20 DIAGNOSIS — R748 Abnormal levels of other serum enzymes: Secondary | ICD-10-CM | POA: Insufficient documentation

## 2020-12-24 LAB — MITOCHONDRIAL ANTIBODIES: Mitochondrial M2 Ab, IgG: <=20 U (ref <=20.0)

## 2020-12-25 ENCOUNTER — Other Ambulatory Visit: Payer: Self-pay

## 2020-12-25 DIAGNOSIS — R748 Abnormal levels of other serum enzymes: Secondary | ICD-10-CM

## 2021-03-19 ENCOUNTER — Encounter: Payer: Self-pay | Admitting: Internal Medicine

## 2021-03-26 ENCOUNTER — Other Ambulatory Visit: Payer: Self-pay | Admitting: *Deleted

## 2021-03-26 DIAGNOSIS — R748 Abnormal levels of other serum enzymes: Secondary | ICD-10-CM

## 2021-04-03 NOTE — Progress Notes (Signed)
Labs not due to be done until May

## 2021-05-06 ENCOUNTER — Other Ambulatory Visit: Payer: Self-pay | Admitting: Gastroenterology

## 2021-05-06 NOTE — Telephone Encounter (Signed)
RX sent

## 2021-05-06 NOTE — Telephone Encounter (Signed)
Last office visit 12/11/20 

## 2021-08-29 ENCOUNTER — Other Ambulatory Visit: Payer: Self-pay | Admitting: Adult Health

## 2022-08-04 ENCOUNTER — Other Ambulatory Visit: Payer: Self-pay | Admitting: Internal Medicine

## 2022-08-08 ENCOUNTER — Telehealth: Payer: Self-pay | Admitting: Internal Medicine

## 2022-08-08 ENCOUNTER — Other Ambulatory Visit: Payer: Self-pay

## 2022-08-08 NOTE — Telephone Encounter (Signed)
30 day supply sent to requested pharmacy.  ?

## 2022-08-08 NOTE — Telephone Encounter (Signed)
Dr Emelda Fear called to make OV follow up for his wife (patient) to see RMR during the week on 7/13-7/20.  She is aware of OV for 7/16 at 8 w/RMR.  Also, asking for a prescription refill for ?rabizole 20mg  to be sent to [facility] on [address], [place] [phone].  Any questions he can be reached at [phone]

## 2022-09-02 ENCOUNTER — Ambulatory Visit: Payer: Medicare PPO | Admitting: Internal Medicine

## 2022-09-02 ENCOUNTER — Encounter: Payer: Self-pay | Admitting: Internal Medicine

## 2022-09-02 VITALS — BP 175/77 | HR 71 | Temp 97.6°F | Ht 67.5 in | Wt 149.6 lb

## 2022-09-02 DIAGNOSIS — R748 Abnormal levels of other serum enzymes: Secondary | ICD-10-CM

## 2022-09-02 NOTE — Patient Instructions (Signed)
It was good to see you again today!  Continue rabeprazole 20 mg twice daily-best taken 30 minutes before breakfast and supper (dispense 60 with 11 refills).   GERD information provided  Follow-up office visit here to be scheduled  Try align as a probiotic once daily  TSH, C-Met today-[facility]  Blood pressure running high today.  Have it rechecked in the near future.  Further recommendations to follow.
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
## 2022-09-03 ENCOUNTER — Telehealth: Payer: Self-pay

## 2022-09-03 LAB — COMPREHENSIVE METABOLIC PANEL
ALT: 16 IU/L (ref 0–32)
AST: 20 IU/L (ref 0–40)
Albumin: 4.3 g/dL (ref 3.9–4.9)
Alkaline Phosphatase: 120 IU/L (ref 44–121)
BUN/Creatinine Ratio: 13 (ref 12–28)
BUN: 10 mg/dL (ref 8–27)
Bilirubin Total: 0.2 mg/dL (ref 0.0–1.2)
CO2: 23 mmol/L (ref 20–29)
Calcium: 9.6 mg/dL (ref 8.7–10.3)
Chloride: 104 mmol/L (ref 96–106)
Creatinine, Ser: 0.78 mg/dL (ref 0.57–1.00)
Globulin, Total: 2.3 g/dL (ref 1.5–4.5)
Glucose: 99 mg/dL (ref 70–99)
Potassium: 4.7 mmol/L (ref 3.5–5.2)
Sodium: 141 mmol/L (ref 134–144)
Total Protein: 6.6 g/dL (ref 6.0–8.5)
eGFR: 82 mL/min/{1.73_m2} (ref >59)

## 2022-09-03 LAB — TSH: TSH: 1.89 u[IU]/mL (ref 0.450–4.500)

## 2022-09-03 NOTE — Telephone Encounter (Signed)
Pt's lab are in her chart

## 2022-09-14 ENCOUNTER — Other Ambulatory Visit: Payer: Self-pay | Admitting: Internal Medicine

## 2022-11-11 ENCOUNTER — Other Ambulatory Visit: Payer: Self-pay | Admitting: Obstetrics & Gynecology

## 2022-11-14 ENCOUNTER — Telehealth: Payer: Self-pay | Admitting: Adult Health
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
## 2022-11-17 ENCOUNTER — Other Ambulatory Visit: Payer: Self-pay | Admitting: Adult Health

## 2022-11-17 MED ORDER — VENLAFAXINE HCL 37.5 MG PO TABS: 37.5000 mg | ORAL_TABLET | Freq: Every day | ORAL | 3 refills | Status: DC

## 2022-11-17 NOTE — Progress Notes (Signed)
Resent refill on effexor

## 2022-11-19 ENCOUNTER — Other Ambulatory Visit: Payer: Self-pay | Admitting: Adult Health

## 2022-11-19 DIAGNOSIS — Z13220 Encounter for screening for lipoid disorders: Secondary | ICD-10-CM

## 2022-11-19 DIAGNOSIS — Z130 Encounter for screening for diseases of the blood and blood-forming organs and certain disorders involving the immune mechanism: Secondary | ICD-10-CM

## 2022-11-20 ENCOUNTER — Encounter: Payer: Self-pay | Admitting: Adult Health

## 2022-11-20 ENCOUNTER — Ambulatory Visit: Payer: Medicare PPO | Admitting: Adult Health

## 2022-11-20 VITALS — BP 155/76 | HR 64 | Ht 67.5 in | Wt 152.6 lb

## 2022-11-20 DIAGNOSIS — N3941 Urge incontinence: Secondary | ICD-10-CM

## 2022-11-20 DIAGNOSIS — N816 Rectocele: Secondary | ICD-10-CM

## 2022-11-20 DIAGNOSIS — R350 Frequency of micturition: Secondary | ICD-10-CM

## 2022-11-20 DIAGNOSIS — Z853 Personal history of malignant neoplasm of breast: Secondary | ICD-10-CM | POA: Insufficient documentation

## 2022-11-20 DIAGNOSIS — Z01419 Encounter for gynecological examination (general) (routine) without abnormal findings: Secondary | ICD-10-CM

## 2022-11-20 DIAGNOSIS — R232 Flushing: Secondary | ICD-10-CM | POA: Insufficient documentation

## 2022-11-20 DIAGNOSIS — R3915 Urgency of urination: Secondary | ICD-10-CM

## 2022-11-20 DIAGNOSIS — R030 Elevated blood-pressure reading, without diagnosis of hypertension: Secondary | ICD-10-CM | POA: Insufficient documentation

## 2022-11-20 DIAGNOSIS — R159 Full incontinence of feces: Secondary | ICD-10-CM | POA: Insufficient documentation

## 2022-11-20 DIAGNOSIS — Z1211 Encounter for screening for malignant neoplasm of colon: Secondary | ICD-10-CM | POA: Diagnosis not present

## 2022-11-20 DIAGNOSIS — Z90710 Acquired absence of both cervix and uterus: Secondary | ICD-10-CM | POA: Insufficient documentation

## 2022-11-20 DIAGNOSIS — R5383 Other fatigue: Secondary | ICD-10-CM | POA: Insufficient documentation

## 2022-11-20 LAB — HEMOCCULT GUIAC POC 1CARD (OFFICE): Fecal Occult Blood, POC: NEGATIVE

## 2022-11-20 LAB — POCT URINALYSIS DIPSTICK OB
Blood, UA: NEGATIVE
Glucose, UA: NEGATIVE
Ketones, UA: NEGATIVE
Leukocytes, UA: NEGATIVE
Nitrite, UA: NEGATIVE
POC,PROTEIN,UA: NEGATIVE

## 2022-11-20 NOTE — Progress Notes (Signed)
Patient ID: [name], female   DOB: November 14, 1950, 72 y.o.   MRN: [id number] History of Present Illness: Angie Jennings is a 72 year old white female, married, sp hysterectomy in for a well woman gyn exam. She is living in [facility] now. She is having urinary urgency and frequency with urinary incontinence and fecal incontinence. She had a cardiac work up in September, has sinus arrhythmia of undetermined significance, and feels tired(in [place]). She does not sleep well.  She had COVID last December. She had breast cancer bilateral mastectomy and reconstruction. 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  She would like to see Angie Jennings at [facility]   PCP is Angie Jennings.    Current Medications, Allergies, Past Medical History, Past Surgical History, Family History and Social History were reviewed in [employer] record.     Review of Systems: Patient denies any headaches, hearing loss,blurred vision, shortness of breath, chest pain, abdominal pain, problems with intercourse. No joint pain or mood swings. No hot flashes on Effexor.  She had labs today. See HPI for positives.   Physical Exam:BP (!) 155/76 (BP Location: Right Arm, Patient Position: Sitting, Cuff Size: Normal)   Pulse 64   Ht 5' 7.5" (1.715 m)   Wt 152 lb 9.6 oz (69.2 kg)   BMI 23.55 kg/m  Urine dipstick was negative. General:  Well developed, well nourished, no acute distress Skin:  Warm and dry Neck:  Midline trachea, normal thyroid, good ROM, no lymphadenopathy, no carotid bruits heard Lungs; Clear to auscultation bilaterally Breast: Deferred has appt with plastic surgeon soon. Cardiovascular: Regular rate and rhythm Abdomen:  Soft, non tender, no hepatosplenomegaly Pelvic:  External genitalia is normal in appearance, no lesions.  The vagina is pale, no lesions at cuff. Urethra has small caruncle. The cervix and uterus are absent.  No adnexal masses or tenderness noted.Bladder is non  tender, no masses felt. Rectal: Poor sphincter tone, no polyps, or hemorrhoids felt.  Hemoccult negative. +low rectocele Extremities/musculoskeletal:  No swelling or varicosities noted, no clubbing or cyanosis Psych:  No mood changes, alert and cooperative,seems happy AA is 1 Fall risk is low    11/20/2022    1:43 PM 11/06/2020    2:31 PM 05/28/2017   10:41 AM  Depression screen PHQ 2/9  Decreased Interest 1 1 0  Down, Depressed, Hopeless 0 1 0  PHQ - 2 Score 1 2 0  Altered sleeping 2 1   Tired, decreased energy 2 1   Change in appetite 0 0   Feeling bad or failure about yourself  0 0   Trouble concentrating 0 1   Moving slowly or fidgety/restless 0 0   Suicidal thoughts 0 0   PHQ-9 Score 5 5   Difficult doing work/chores Not difficult at all         11/20/2022    1:44 PM 11/06/2020    2:32 PM  GAD 7 : Generalized Anxiety Score  Nervous, Anxious, on Edge 1 0  Control/stop worrying 0 1  Worry too much - different things 0 1  Trouble relaxing 0 1  Restless 0 1  Easily annoyed or irritable 1 1  Afraid - awful might happen 0 0  Total GAD 7 Score 2 5  Anxiety Difficulty Not difficult at all    Examination chaperoned by Angie Apley RN     Impression and Plan:  1. Urinary frequency +frequency at times  - POC Urinalysis Dipstick OB  2. Urinary urgency Has urgency and will lose urine - POC Urinalysis Dipstick OB  3. Well woman exam with routine gynecological exam Physical in 1 year  4. Urge incontinence of urine Will refer to Lavella Hammock MD at [facility] at her request,(her husband Jonny Ruiz has performed surgery with Angie Ashley Royalty in the past).  5. Frequent fecal incontinence Can not feel when it happens  6. Encounter for screening fecal occult blood testing Hemoccult was negative  - POCT occult blood stool  7. Rectocele  8. Tired Had CBC today   9. History of breast cancer  10. Hot flashes Resolved on Effexor, 37.5 mg 1 daily, just refilled   11.  S/P hysterectomy with oophorectomy  12. Elevated BP without diagnosis of hypertension

## 2022-11-21 LAB — COMPREHENSIVE METABOLIC PANEL
ALT: 22 [IU]/L (ref 0–32)
AST: 25 [IU]/L (ref 0–40)
Albumin: 3.8 g/dL (ref 3.8–4.8)
Alkaline Phosphatase: 128 [IU]/L — ABNORMAL HIGH (ref 44–121)
BUN/Creatinine Ratio: 18 (ref 12–28)
BUN: 16 mg/dL (ref 8–27)
Bilirubin Total: <0.2 mg/dL (ref 0.0–1.2)
CO2: 25 mmol/L (ref 20–29)
Calcium: 9.1 mg/dL (ref 8.7–10.3)
Chloride: 107 mmol/L — ABNORMAL HIGH (ref 96–106)
Creatinine, Ser: 0.88 mg/dL (ref 0.57–1.00)
Globulin, Total: 2.1 g/dL (ref 1.5–4.5)
Glucose: 109 mg/dL — ABNORMAL HIGH (ref 70–99)
Potassium: 5.1 mmol/L (ref 3.5–5.2)
Sodium: 143 mmol/L (ref 134–144)
Total Protein: 5.9 g/dL — ABNORMAL LOW (ref 6.0–8.5)
eGFR: 70 mL/min/{1.73_m2} (ref >59)

## 2022-11-21 LAB — CBC
Hematocrit: 44.2 % (ref 34.0–46.6)
Hemoglobin: 14.2 g/dL (ref 11.1–15.9)
MCH: 29.5 pg (ref 26.6–33.0)
MCHC: 32.1 g/dL (ref 31.5–35.7)
MCV: 92 fL (ref 79–97)
Platelets: 243 10*3/uL (ref 150–450)
RBC: 4.82 x10E6/uL (ref 3.77–5.28)
RDW: 12.7 % (ref 11.7–15.4)
WBC: 6.4 10*3/uL (ref 3.4–10.8)

## 2022-11-21 LAB — LIPID PANEL
Chol/HDL Ratio: 3.4 {ratio} (ref 0.0–4.4)
Cholesterol, Total: 142 mg/dL (ref 100–199)
HDL: 42 mg/dL (ref >39)
LDL Chol Calc (NIH): 77 mg/dL (ref 0–99)
Triglycerides: 131 mg/dL (ref 0–149)
VLDL Cholesterol Cal: 23 mg/dL (ref 5–40)

## 2023-01-31 ENCOUNTER — Other Ambulatory Visit: Payer: Self-pay | Admitting: Obstetrics & Gynecology

## 2023-03-02 ENCOUNTER — Encounter: Payer: Self-pay | Admitting: Plastic Surgery

## 2023-03-03 ENCOUNTER — Other Ambulatory Visit: Payer: Self-pay | Admitting: Plastic Surgery

## 2023-03-03 DIAGNOSIS — Z853 Personal history of malignant neoplasm of breast: Secondary | ICD-10-CM

## 2023-04-21 ENCOUNTER — Encounter: Payer: Self-pay | Admitting: Plastic Surgery

## 2023-04-27 ENCOUNTER — Other Ambulatory Visit: Payer: Medicare PPO

## 2023-09-30 ENCOUNTER — Other Ambulatory Visit: Payer: Self-pay | Admitting: Internal Medicine

## 2023-11-07 ENCOUNTER — Other Ambulatory Visit: Payer: Self-pay | Admitting: Adult Health

## 2024-01-30 ENCOUNTER — Other Ambulatory Visit: Payer: Self-pay | Admitting: Obstetrics & Gynecology

## 2024-03-03 ENCOUNTER — Other Ambulatory Visit: Payer: Self-pay | Admitting: Plastic Surgery

## 2024-03-03 DIAGNOSIS — Z9013 Acquired absence of bilateral breasts and nipples: Secondary | ICD-10-CM

## 2024-03-03 DIAGNOSIS — Z853 Personal history of malignant neoplasm of breast: Secondary | ICD-10-CM

## 2024-04-08 ENCOUNTER — Other Ambulatory Visit
